# Patient Record
Sex: Female | Born: 1955 | Race: White | Hispanic: No | Marital: Married | Smoking: Never smoker
Health system: Southern US, Community
[De-identification: ages and names within clinical notes are randomized; demographics above are authoritative.]

## PROBLEM LIST (undated history)

## (undated) DIAGNOSIS — K8020 Calculus of gallbladder without cholecystitis without obstruction: Secondary | ICD-10-CM

## (undated) DIAGNOSIS — J45909 Unspecified asthma, uncomplicated: Secondary | ICD-10-CM

## (undated) DIAGNOSIS — F32A Depression, unspecified: Secondary | ICD-10-CM

## (undated) DIAGNOSIS — F419 Anxiety disorder, unspecified: Secondary | ICD-10-CM

## (undated) DIAGNOSIS — Z87440 Personal history of urinary (tract) infections: Secondary | ICD-10-CM

## (undated) DIAGNOSIS — E785 Hyperlipidemia, unspecified: Secondary | ICD-10-CM

## (undated) DIAGNOSIS — E559 Vitamin D deficiency, unspecified: Secondary | ICD-10-CM

## (undated) DIAGNOSIS — I499 Cardiac arrhythmia, unspecified: Secondary | ICD-10-CM

## (undated) DIAGNOSIS — K5900 Constipation, unspecified: Secondary | ICD-10-CM

## (undated) DIAGNOSIS — D649 Anemia, unspecified: Secondary | ICD-10-CM

## (undated) DIAGNOSIS — R87629 Unspecified abnormal cytological findings in specimens from vagina: Secondary | ICD-10-CM

## (undated) DIAGNOSIS — K219 Gastro-esophageal reflux disease without esophagitis: Secondary | ICD-10-CM

## (undated) DIAGNOSIS — G4700 Insomnia, unspecified: Secondary | ICD-10-CM

## (undated) DIAGNOSIS — T7840XA Allergy, unspecified, initial encounter: Secondary | ICD-10-CM

## (undated) DIAGNOSIS — E079 Disorder of thyroid, unspecified: Secondary | ICD-10-CM

## (undated) DIAGNOSIS — M62838 Other muscle spasm: Secondary | ICD-10-CM

## (undated) DIAGNOSIS — M1990 Unspecified osteoarthritis, unspecified site: Secondary | ICD-10-CM

## (undated) DIAGNOSIS — I493 Ventricular premature depolarization: Secondary | ICD-10-CM

## (undated) DIAGNOSIS — M810 Age-related osteoporosis without current pathological fracture: Secondary | ICD-10-CM

## (undated) DIAGNOSIS — Z780 Asymptomatic menopausal state: Secondary | ICD-10-CM

## (undated) DIAGNOSIS — R001 Bradycardia, unspecified: Secondary | ICD-10-CM

## (undated) DIAGNOSIS — J309 Allergic rhinitis, unspecified: Secondary | ICD-10-CM

## (undated) DIAGNOSIS — H9319 Tinnitus, unspecified ear: Secondary | ICD-10-CM

## (undated) DIAGNOSIS — F329 Major depressive disorder, single episode, unspecified: Secondary | ICD-10-CM

## (undated) HISTORY — DX: Depression, unspecified: F32.A

## (undated) HISTORY — DX: Unspecified abnormal cytological findings in specimens from vagina: R87.629

## (undated) HISTORY — PX: COSMETIC SURGERY: SHX468

## (undated) HISTORY — DX: Personal history of urinary (tract) infections: Z87.440

## (undated) HISTORY — DX: Tinnitus, unspecified ear: H93.19

## (undated) HISTORY — DX: Allergic rhinitis, unspecified: J30.9

## (undated) HISTORY — DX: Major depressive disorder, single episode, unspecified: F32.9

## (undated) HISTORY — DX: Disorder of thyroid, unspecified: E07.9

## (undated) HISTORY — DX: Hyperlipidemia, unspecified: E78.5

## (undated) HISTORY — DX: Anxiety disorder, unspecified: F41.9

## (undated) HISTORY — DX: Age-related osteoporosis without current pathological fracture: M81.0

## (undated) HISTORY — DX: Other muscle spasm: M62.838

## (undated) HISTORY — DX: Unspecified osteoarthritis, unspecified site: M19.90

## (undated) HISTORY — DX: Asymptomatic menopausal state: Z78.0

## (undated) HISTORY — DX: Ventricular premature depolarization: I49.3

## (undated) HISTORY — DX: Bradycardia, unspecified: R00.1

## (undated) HISTORY — DX: Vitamin D deficiency, unspecified: E55.9

## (undated) HISTORY — DX: Allergy, unspecified, initial encounter: T78.40XA

## (undated) HISTORY — DX: Calculus of gallbladder without cholecystitis without obstruction: K80.20

## (undated) HISTORY — DX: Insomnia, unspecified: G47.00

## (undated) HISTORY — PX: BREAST CYST ASPIRATION: SHX578

---

## 2003-07-15 HISTORY — PX: GANGLION CYST EXCISION: SHX1691

## 2004-02-07 ENCOUNTER — Other Ambulatory Visit: Admission: RE | Admit: 2004-02-07 | Discharge: 2004-02-07 | Payer: Self-pay | Admitting: Obstetrics and Gynecology

## 2004-04-02 ENCOUNTER — Other Ambulatory Visit: Admission: RE | Admit: 2004-04-02 | Discharge: 2004-04-02 | Payer: Self-pay | Admitting: Obstetrics and Gynecology

## 2004-07-11 ENCOUNTER — Ambulatory Visit (HOSPITAL_BASED_OUTPATIENT_CLINIC_OR_DEPARTMENT_OTHER): Admission: RE | Admit: 2004-07-11 | Discharge: 2004-07-11 | Payer: Self-pay | Admitting: Orthopedic Surgery

## 2005-03-19 ENCOUNTER — Other Ambulatory Visit: Admission: RE | Admit: 2005-03-19 | Discharge: 2005-03-19 | Payer: Self-pay | Admitting: Obstetrics and Gynecology

## 2006-08-11 ENCOUNTER — Ambulatory Visit: Payer: Self-pay | Admitting: Cardiology

## 2006-08-11 LAB — CONVERTED CEMR LAB
Basophils Absolute: 0 10*3/uL (ref 0.0–0.1)
Calcium: 9.4 mg/dL (ref 8.4–10.5)
Chloride: 106 meq/L (ref 96–112)
Eosinophils Absolute: 0.1 10*3/uL (ref 0.0–0.6)
Eosinophils Relative: 1.6 % (ref 0.0–5.0)
GFR calc non Af Amer: 71 mL/min
MCHC: 34.5 g/dL (ref 30.0–36.0)
MCV: 95.4 fL (ref 78.0–100.0)
Platelets: 241 10*3/uL (ref 150–400)
RBC: 4.22 M/uL (ref 3.87–5.11)
TSH: 1.68 microintl units/mL (ref 0.35–5.50)
WBC: 5.6 10*3/uL (ref 4.5–10.5)

## 2006-09-04 ENCOUNTER — Ambulatory Visit: Payer: Self-pay | Admitting: Internal Medicine

## 2006-09-04 ENCOUNTER — Ambulatory Visit (HOSPITAL_COMMUNITY): Admission: RE | Admit: 2006-09-04 | Discharge: 2006-09-04 | Payer: Self-pay | Admitting: Cardiology

## 2007-07-15 HISTORY — PX: SEPTOPLASTY: SUR1290

## 2008-08-02 ENCOUNTER — Inpatient Hospital Stay (HOSPITAL_COMMUNITY): Admission: AD | Admit: 2008-08-02 | Discharge: 2008-08-02 | Payer: Self-pay | Admitting: Obstetrics and Gynecology

## 2008-10-12 DIAGNOSIS — M810 Age-related osteoporosis without current pathological fracture: Secondary | ICD-10-CM

## 2008-10-12 HISTORY — DX: Age-related osteoporosis without current pathological fracture: M81.0

## 2009-05-07 ENCOUNTER — Encounter: Admission: RE | Admit: 2009-05-07 | Discharge: 2009-05-07 | Payer: Self-pay | Admitting: Gastroenterology

## 2010-06-24 ENCOUNTER — Ambulatory Visit (HOSPITAL_BASED_OUTPATIENT_CLINIC_OR_DEPARTMENT_OTHER)
Admission: RE | Admit: 2010-06-24 | Discharge: 2010-06-24 | Payer: Self-pay | Source: Home / Self Care | Attending: Family Medicine | Admitting: Family Medicine

## 2010-07-14 HISTORY — PX: DIAGNOSTIC LAPAROSCOPY: SUR761

## 2010-08-19 ENCOUNTER — Encounter (HOSPITAL_COMMUNITY)
Admission: RE | Admit: 2010-08-19 | Discharge: 2010-08-19 | Disposition: A | Discharge status: Home / Self Care | Payer: 59 | Source: Ambulatory Visit | Attending: Obstetrics and Gynecology | Admitting: Obstetrics and Gynecology

## 2010-08-19 DIAGNOSIS — Z01812 Encounter for preprocedural laboratory examination: Secondary | ICD-10-CM | POA: Insufficient documentation

## 2010-08-19 LAB — BASIC METABOLIC PANEL
CO2: 25 mEq/L (ref 19–32)
Chloride: 103 mEq/L (ref 96–112)
GFR calc Af Amer: >60 mL/min (ref >60)
Potassium: 4.2 mEq/L (ref 3.5–5.1)
Sodium: 138 mEq/L (ref 135–145)

## 2010-08-19 LAB — SURGICAL PCR SCREEN: MRSA, PCR: NEGATIVE

## 2010-08-19 LAB — CBC
Hemoglobin: 13.6 g/dL (ref 12.0–15.0)
RBC: 4.48 MIL/uL (ref 3.87–5.11)

## 2010-08-23 ENCOUNTER — Other Ambulatory Visit: Payer: Self-pay | Admitting: Obstetrics and Gynecology

## 2010-08-23 ENCOUNTER — Ambulatory Visit (HOSPITAL_COMMUNITY)
Admission: RE | Admit: 2010-08-23 | Discharge: 2010-08-23 | Disposition: A | Discharge status: Home / Self Care | Payer: 59 | Source: Ambulatory Visit | Attending: Obstetrics and Gynecology | Admitting: Obstetrics and Gynecology

## 2010-08-23 DIAGNOSIS — N83209 Unspecified ovarian cyst, unspecified side: Secondary | ICD-10-CM | POA: Insufficient documentation

## 2010-08-23 DIAGNOSIS — N838 Other noninflammatory disorders of ovary, fallopian tube and broad ligament: Secondary | ICD-10-CM | POA: Insufficient documentation

## 2010-08-23 DIAGNOSIS — Z01812 Encounter for preprocedural laboratory examination: Secondary | ICD-10-CM | POA: Insufficient documentation

## 2010-08-23 DIAGNOSIS — R1032 Left lower quadrant pain: Secondary | ICD-10-CM | POA: Insufficient documentation

## 2010-09-04 NOTE — Op Note (Addendum)
  NAME:  Chelsea Vega, Chelsea Vega                  ACCOUNT NO.:  [id number]  MEDICAL RECORD NO.:  [id number]           PATIENT TYPE:  O  LOCATION:  SDC                           FACILITY:  [facility]  PHYSICIAN:  Analese Sovine L. Stuart Mirabile, M.D.DATE OF BIRTH:  13-May-1956  DATE OF PROCEDURE:  08/23/2010 DATE OF DISCHARGE:  08/19/2010                              OPERATIVE REPORT   PREOPERATIVE DIAGNOSES:  Left lower quadrant pain and left ovarian cyst.  POSTOPERATIVE DIAGNOSES:  Left lower quadrant pain and left ovarian cyst.  PROCEDURE:  Laparoscopic BSO.  SURGEON:  Alyiah Ulloa L. Vincente Poli, MD.  ANESTHESIA:  General.  FINDINGS:  Left paratubal cyst.  SPECIMENS:  Left tube and ovary, right tube and ovary sent to pathology.  ESTIMATED BLOOD LOSS:  None.  DRAINS:  None.  PROCEDURE:  The patient was taken to the operating room.  She was prepped and draped in the usual sterile fashion and in-and-out catheter was used to empty the bladder.  A uterine manipulator was inserted. Attention was turned to the abdomen.  A small infraumbilical incision was made.  A Veress needle was inserted and pneumoperitoneum was performed.  The 11-mm trocar was inserted.  The patient was placed in Trendelenburg position.  Laparoscope was introduced through the trocar sheath.  A 5-mm trocar was inserted suprapubically under direct visualization.  Exam of the pelvis revealed the uterus was very small. There was no endometriosis.  No adhesions noted.  Right tube and ovary appeared normal.  Left ovary appeared normal and there was a 2-1/2 cm peritubal cyst, which I think was the what we saw on ultrasound.  This may be the cause of the patient's pain because it may be intermittently becoming twisted.  Adnexa, otherwise unremarkable.  We used the EnSeal instrument, identified the ureter which was well below where we were placed just beneath the ovary on the right side and cauterized the infundibular pelvic ligament and went along the  mesosalpinx staying against the tube and across the triple pedicle.  This was done on the right side and the left side.  There was no bleeding whatsoever.  We then converted the 5 to the 11-mm trocar.  Inserted  the endobag skin, both into one bag and removed the specimen without any difficulty.  I then inspected that suprapubic site under low tension and there was no bleeding noted from that site.  There was no bleeding noted from her pedicles either.  Under low tension, the pneumoperitoneum was released.  The trocar was removed.  The O Vicryl was used to close the suprapubic site and then we used skin adhesive to seal the skin.  All sponge, lap and instrument counts were correct x2.  The patient went to recovery room in stable condition.     Sheilah Rayos L. Vincente Poli, M.D.     Florestine Avers  D:  08/23/2010  T:  08/23/2010  Job:  [id number]  Electronically Signed by Marcelle Overlie M.D. on 09/04/2010 07:55:31 AM

## 2010-11-29 NOTE — Op Note (Signed)
NAME:  Chelsea Vega, STRECK                  ACCOUNT NO.:  [id number]   MEDICAL RECORD NO.:  [id number]          PATIENT TYPE:  AMB   LOCATION:  DSC                          FACILITY:  [facility]   PHYSICIAN:  Katy Fitch. Sypher Montez Hageman., M.D.DATE OF BIRTH:  Jul 04, 1956   DATE OF PROCEDURE:  07/11/2004  DATE OF DISCHARGE:                                 OPERATIVE REPORT   PREOPERATIVE DIAGNOSIS:  Enlarging mass, dorsal aspect of the right wrist,  consistent with a dorsal wrist capsule ganglion, adjacent to the radial  wrist extensors, and deep to the extensor pollicis longus.   POSTOPERATIVE DIAGNOSIS:  Enlarging mass, dorsal aspect of the right wrist,  consistent with a dorsal wrist capsule ganglion, adjacent to the radial  wrist extensors, and deep to the extensor pollicis longus.   OPERATION:  Resection of an atypical dorsal ganglion, extending deep to the  radial wrist extensors and extensor pollicis longus, but probably  originating on the dorsal aspect of the scapholunate interosseous ligament,  with a partial capsulectomy, followed by a capsule repair.   SURGEON:  Katy Fitch. Sypher, M.D.   ASSISTANT:  Jonni Sanger, P.A.-C.   ANESTHESIA:  General, sedation, forearm local IV regional.   ANESTHESIOLOGIST:  Zenon Mayo, M.D.   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  She had pain with dorsiflexion of the wrist, and  on palmar flexion had a palpable mass 1 cm in diameter, deep to the extensor  pollicis longus, adjacent to the scaphoid.  This had all the features of a  ganglion.  Plain films of her wrist were normal.   INFORMED CONSENT:  After an informed consent, in which she was clearly  advised that we do not understand the biology of ganglions completely, and  that the chance of recurrence is significant, she proceeds at this time for  a relief  of her mechanical symptoms.   DESCRIPTION OF PROCEDURE:  The patient is brought to the operating room and  placed in the supine position upon the operating room table.  Following the  placement of a forearm-level IV regional block, the right arm was prepped  with Betadine soap and solution and sterilely draped.  When anesthesia was  satisfactory, the procedure commenced with a short transverse incision  directly over the palpably enlarged ganglion.  The subcutaneous tissues were  carefully divided, taking care to identify the extensor pollicis longus and  radial wrist extensors in the third and second dorsal compartments  respectively.  The mass was circumferentially dissected and appeared to have a small sinus  track extending directly to the dorsal aspect of the scapholunate  interosseous ligament.  The mass and its neck were excised down to the level  of the ligament.  A Freer elevator was then used to carefully palpate and  compress the dorsal capsule, looking for other areas of mucin production.  On a meticulous examination of the dorsal capsule, I could not identify any  other origins of this predicament.  The capsulectomy site was then closed with figure-of-eight sutures of #4-0  Vicryl x2.  A water-tight seal of the wrist joint was achieved.  The wound  was then repaired with intra-dermal #3-0 Prolene suture.  A compressive  dressing was applied with a volar plaster splint, maintaining the wrist in  five degrees of dorsiflexion.   AFTER-CARE:  The patient is given a prescription for Darvocet-N 100, one  p.o. q.4-6h. p.r.n. pain, #20 tab, without refill.  She will return to our office for followup in one week for a dressing change  and a wound examination, with the initiation of range of motion exercises.      Robe   RVS/MEDQ  D:  07/11/2004  T:  07/11/2004  Job:  [id number]

## 2010-11-29 NOTE — Assessment & Plan Note (Signed)
[facility]                            CARDIOLOGY OFFICE NOTE   [name], [name]                         MRN:          [id number]  DATE:08/11/2006                            DOB:          Mar 06, 1956    PRIMARY CARE PHYSICIAN:  Joycelyn Rua, M.D.   REASON FOR PRESENTATION:  Patient with dyspnea and tachycardia.   HISTORY OF PRESENT ILLNESS:  The patient is a pleasant 55 year old white  female.  She is a runner, running 3-4 times per week for years.  She has  noticed, however, recently when she has taken her heart rate with a  monitor which she has been using, or on a treadmill, that when she gets  to a moderate level of activity with perhaps a 10 minute mile, where she  is quite comfortable, her heart rate may be as high as 200 beats per  minute.  She thinks that she is somewhat limited at this level by more  dyspnea than she should have.  She thinks that this has been a slowly  progressive problem and she has been unable to increase her training  because of this.  She does not have any resting tachycardia or  palpitations.  She has not had any presyncope or syncope.  She does not  have any chest discomfort, neck discomfort, arm discomfort, activity-  induced nausea, vomiting, excessive diaphoresis.  She does not have any  resting shortness of breath and denies any PND or orthopnea.   PAST MEDICAL HISTORY:  Depression.   PAST SURGICAL HISTORY:  Ganglion cyst resected.   ALLERGIES:  None.   MEDICATIONS:  Flonase, Claritin.   SOCIAL HISTORY:  The patient is married.  She has three adult children.  She has never really smoked cigarettes.  She will occasionally drink  alcohol.   FAMILY HISTORY:  Noncontributory for early coronary artery disease.  She  does have a sister with Graves' disease.   REVIEW OF SYSTEMS:  As stated in the HPI and positive for questionable  mild asthma, occasional constipation.  Negative for all other systems.   PHYSICAL EXAMINATION:  GENERAL:  The patient is in no distress.  VITAL SIGNS:  Blood pressure 113/77, heart rate 53 and regular, weight  117 pounds, body mass index 19.  HEENT:  Eyelids unremarkable.  Pupils equal, round, and reactive to  light.  Fundi within normal limits.  Oral mucosa normal.  NECK:  No jugular venous distention to 45 degrees.  Carotid upstroke  brisk and symmetric.  No bruits, no thyromegaly.  LYMPHATICS:  No cervical, axillary, or inguinal adenopathy.  LUNGS:  Clear to auscultation bilaterally.  BACK:  No costovertebral angle tenderness.  CHEST:  Unremarkable.  HEART:  PMI not displaced or sustained.  S1 and S2 within normal limits.  No S3, no S4, no clicks, no rubs, no murmurs.  ABDOMEN:  Flat.  Positive bowel sounds.  Normal in frequency, pitch.  No  bruits, rebound, guarding, no midline pulsatile mass, hepatomegaly,  splenomegaly.  SKIN:  No rashes, no nodules  EXTREMITIES:  2+ pulses throughout, no cyanosis,  no clubbing, no edema.  NEUROLOGIC:  Oriented to person, place, and time.  Cranial nerves II-XII  grossly intact.  Motor grossly intact throughout.   LABORATORY DATA:  EKG:  Sinus bradycardia, rate 53, axis within normal  limits.  Intervals  within normal limits.  No acute ST/T-wave changes.   ASSESSMENT/PLAN:  1. Dyspnea.  The patient is limited by dyspnea with exertion at a      level that she thinks should otherwise be comfortable.  She is also      having a tachycardia.  At this point I am going to check a TSH,      CBC, and CMET.  I also think the next step that would be very      helpful for this runner with unexplained dyspnea and questionable      asthma (questionable exercise-induced asthma) would be a      cardiopulmonary stress test.  We discussed this and the patient      agrees to proceed.  2. Follow-up will be based on the results of the above.     Rollene Rotunda, MD, [facility]  Electronically Signed    JH/MedQ  DD: 08/11/2006  DT:  08/11/2006  Job #: [id number]   cc:   Joycelyn Rua, M.D.

## 2011-03-05 ENCOUNTER — Ambulatory Visit (INDEPENDENT_AMBULATORY_CARE_PROVIDER_SITE_OTHER): Payer: 59 | Admitting: Family Medicine

## 2011-03-05 ENCOUNTER — Encounter: Payer: Self-pay | Admitting: Family Medicine

## 2011-03-05 VITALS — BP 102/67 | HR 54 | Ht 65.0 in | Wt 120.0 lb

## 2011-03-05 DIAGNOSIS — M25579 Pain in unspecified ankle and joints of unspecified foot: Secondary | ICD-10-CM

## 2011-03-05 DIAGNOSIS — S93409A Sprain of unspecified ligament of unspecified ankle, initial encounter: Secondary | ICD-10-CM

## 2011-03-05 MED ORDER — MELOXICAM 7.5 MG PO TABS: 7.5000 mg | ORAL_TABLET | Freq: Every day | ORAL | Status: AC

## 2011-03-05 NOTE — Patient Instructions (Signed)
1. Follow up in 3-4 weeks.  2. Use meloxicam once daily for pain and inflammation.  3. Wear support with daily activities.

## 2011-03-06 DIAGNOSIS — S93409A Sprain of unspecified ligament of unspecified ankle, initial encounter: Secondary | ICD-10-CM | POA: Insufficient documentation

## 2011-03-06 NOTE — Assessment & Plan Note (Signed)
Adding ASO for daily activity and starting ankle sprain rehab at physical therapy.  If she doesn't have improvement over the next 3-4 weeks we will need to rule out a tear of the peroneal tendons with either and ankle ultrasound or MRI.  Mobic rx given today as well.

## 2011-03-06 NOTE — Progress Notes (Signed)
  Subjective:    Patient ID: Chelsea Vega, female    DOB: 04-22-1956, 55 y.o.   MRN: [id number]  HPI 55 y/o female had an inversion ankle sprain around the end of July.  She is here because she still gets pain around the right lateral malleolus with running, which resolves with rest.  She took 3 weeks off immediately after the sprain.  When she returned to running she was able to run about 4 miles before the pain started.  Lately she cannot even run for one mile before the pain starts.  She can't remember if there was swelling immediately because she was drinking but does state that there was pain and swelling the next day.  She waited for the symptoms to resolve before returning to running.  She doesn't have any pain with normal daily activity.     Review of Systems     Objective:   Physical Exam  Right ankle: Trace swelling of the anterior lateral malleolus without visible erythema. Tenderness to palpation over the peroneal tendons proximal to the insertion on the 5th metatarsal Range of motion is full in all directions. Strength is 5/5 in all directions. Stable lateral and medial ligaments; squeeze test and kleiger test unremarkable; Talar dome nontender; No pain at base of 5th MT;  No tenderness on posterior aspects of the medial malleolus Able to walk 4 steps.       Assessment & Plan:

## 2011-04-02 ENCOUNTER — Ambulatory Visit (HOSPITAL_COMMUNITY)
Admission: RE | Admit: 2011-04-02 | Discharge: 2011-04-02 | Disposition: A | Discharge status: Home / Self Care | Payer: 59 | Source: Ambulatory Visit | Attending: Family Medicine | Admitting: Family Medicine

## 2011-04-02 ENCOUNTER — Ambulatory Visit (INDEPENDENT_AMBULATORY_CARE_PROVIDER_SITE_OTHER): Payer: 59 | Admitting: Family Medicine

## 2011-04-02 VITALS — BP 115/73

## 2011-04-02 DIAGNOSIS — M25579 Pain in unspecified ankle and joints of unspecified foot: Secondary | ICD-10-CM | POA: Insufficient documentation

## 2011-04-02 DIAGNOSIS — S93409A Sprain of unspecified ligament of unspecified ankle, initial encounter: Secondary | ICD-10-CM

## 2011-04-04 NOTE — Progress Notes (Signed)
  Subjective:    Patient ID: Chelsea Vega, female    DOB: 30-Aug-1955, 55 y.o.   MRN: [id number]  HPI 55 y/o female is here for follow up for ongoing right ankle pain following and inversion injury in July.  She has been to physical therapy and reports that the pain is worse with therapy.  She still gets intermittent swelling.  She has not returned to running.  She has mild pain with daily activities but by the end of the day she has significant swelling about the lateral ankle.   Review of Systems     Objective:   Physical Exam  Ankle: Mild swelling of the lateral malleolus Range of motion is full in all directions. Strength is 5/5 in all directions. Mildly positive drawer Tenderness to palpation over the anterior aspect of the lateral malleolus No tenderness of the peroneal tendons, No pain at base of 5th MT No tenderness over cuboid; No tenderness over the navicular prominence No tenderness on posterior aspects of lateral and medial malleolus Able to walk 4 steps.      Assessment & Plan:

## 2011-04-04 NOTE — Progress Notes (Signed)
MRI authorization # is [id number].  Pt notified that appt is 04/07/11 @ 6:45pm, arrive at 6:15pm @ [address] [place] [facility].

## 2011-04-07 ENCOUNTER — Ambulatory Visit
Admission: RE | Admit: 2011-04-07 | Discharge: 2011-04-07 | Disposition: A | Discharge status: Home / Self Care | Payer: 59 | Source: Ambulatory Visit | Attending: Sports Medicine | Admitting: Sports Medicine

## 2011-04-07 DIAGNOSIS — M25579 Pain in unspecified ankle and joints of unspecified foot: Secondary | ICD-10-CM

## 2011-04-07 NOTE — Assessment & Plan Note (Signed)
Plain films ordered on the day of the visit are normal.  Plan on MRI to evaluated for ligament disruption, tendon rupture, and OCD.  Will treat according to results.

## 2011-04-14 ENCOUNTER — Telehealth: Payer: Self-pay | Admitting: *Deleted

## 2011-04-14 NOTE — Telephone Encounter (Signed)
Per Dr. Henriette Combs order, pt scheduled for appt with Dr. Thurston Hole 04/17/11 at 330.  Pt notified of appt info, referral info faxed to Dr. Sherene Sires office.

## 2011-05-15 HISTORY — PX: ANKLE SURGERY: SHX546

## 2011-12-31 ENCOUNTER — Other Ambulatory Visit: Payer: Self-pay | Admitting: Obstetrics and Gynecology

## 2013-01-06 ENCOUNTER — Other Ambulatory Visit: Payer: Self-pay | Admitting: Obstetrics and Gynecology

## 2013-01-25 ENCOUNTER — Other Ambulatory Visit: Payer: Self-pay | Admitting: Gastroenterology

## 2013-10-28 ENCOUNTER — Other Ambulatory Visit: Payer: Self-pay | Admitting: Dermatology

## 2014-01-23 ENCOUNTER — Other Ambulatory Visit: Payer: Self-pay | Admitting: Obstetrics and Gynecology

## 2014-01-24 LAB — CYTOLOGY - PAP

## 2015-02-01 ENCOUNTER — Other Ambulatory Visit: Payer: Self-pay | Admitting: Obstetrics and Gynecology

## 2015-02-02 LAB — CYTOLOGY - PAP

## 2015-02-15 ENCOUNTER — Ambulatory Visit: Admit: 2015-02-15 | Payer: Self-pay | Admitting: Obstetrics and Gynecology

## 2015-02-15 SURGERY — ANTERIOR (CYSTOCELE) AND POSTERIOR REPAIR (RECTOCELE)
Anesthesia: Choice

## 2015-05-16 NOTE — H&P (Signed)
59 year old G 3 P 3 PMP with symptomatic prolapse. She is on elestrin and Prometrium.  Past Medical History: UTI  Past Surgical history: None  Prior to Admission medications   Medication Sig Start Date End Date Taking? Authorizing Provider  B Complex Vitamins (B COMPLEX PO) Take 1 tablet by mouth daily.      Historical Provider, MD  Calcium Carbonate (CALTRATE 600 PO) Take 1 tablet by mouth daily.      Historical Provider, MD  cholecalciferol (VITAMIN D) 1000 UNITS tablet Take 4,000 Units by mouth daily.      Historical Provider, MD  Estradiol (ELESTRIN) 0.52 MG/0.87 GM (0.06%) GEL Place onto the skin daily.      Historical Provider, MD  fluticasone (FLONASE) 50 MCG/ACT nasal spray Place 2 puffs into the nose as needed. 01/06/11   Historical Provider, MD  lubiprostone (AMITIZA) 8 MCG capsule Take 8 mcg by mouth daily.      Historical Provider, MD  progesterone (PROMETRIUM) 100 MG capsule Take 100 mg by mouth daily. 01/27/11   Historical Provider, MD  Zoledronic Acid (RECLAST IV) IV once per year.     Historical Provider, MD   Allergies:  None  Family history positive for Heart disease, Asthma, IBS, Ovarian cancer (mother)  Vital signs stable  General alert and oriented Lung CTAB Car RRR Abdomen is soft and non tender Pelvic Grade 2 cystocele Grade 1 to 2 rectocele Grade 1 to 2 uterine prolapse  IMPRESSION: Symptomatic Prolapse  PLAN: Anterior repair Posterior Repair Possible Total Vaginal Hysterectomy  Risks reviewed Consent signed

## 2015-05-18 ENCOUNTER — Encounter (HOSPITAL_COMMUNITY)
Admission: RE | Admit: 2015-05-18 | Discharge: 2015-05-18 | Disposition: A | Discharge status: Home / Self Care | Payer: 59 | Source: Ambulatory Visit | Attending: Obstetrics and Gynecology | Admitting: Obstetrics and Gynecology

## 2015-05-18 ENCOUNTER — Encounter (HOSPITAL_COMMUNITY): Payer: Self-pay

## 2015-05-18 DIAGNOSIS — Z01818 Encounter for other preprocedural examination: Secondary | ICD-10-CM | POA: Diagnosis not present

## 2015-05-18 DIAGNOSIS — N816 Rectocele: Secondary | ICD-10-CM | POA: Diagnosis not present

## 2015-05-18 DIAGNOSIS — N8110 Cystocele, unspecified: Secondary | ICD-10-CM | POA: Insufficient documentation

## 2015-05-18 HISTORY — DX: Gastro-esophageal reflux disease without esophagitis: K21.9

## 2015-05-18 HISTORY — DX: Anemia, unspecified: D64.9

## 2015-05-18 HISTORY — DX: Cardiac arrhythmia, unspecified: I49.9

## 2015-05-18 HISTORY — DX: Constipation, unspecified: K59.00

## 2015-05-18 HISTORY — DX: Unspecified asthma, uncomplicated: J45.909

## 2015-05-18 LAB — CBC
HCT: 41.7 % (ref 36.0–46.0)
HEMOGLOBIN: 13.9 g/dL (ref 12.0–15.0)
MCH: 31.4 pg (ref 26.0–34.0)
MCHC: 33.3 g/dL (ref 30.0–36.0)
MCV: 94.3 fL (ref 78.0–100.0)
PLATELETS: 209 10*3/uL (ref 150–400)
RBC: 4.42 MIL/uL (ref 3.87–5.11)
RDW: 12.1 % (ref 11.5–15.5)
WBC: 7.5 10*3/uL (ref 4.0–10.5)

## 2015-05-18 NOTE — Patient Instructions (Signed)
Your procedure is scheduled on:  Thursday, NOV. 10, 2016  Enter through the Main Entrance of [facility], [place] at:  6:00 AM  Pick up the phone at the desk and dial [phone].  Call this number if you have problems the morning of surgery: [phone].  Remember: Do NOT eat food or drink after:  Midnight Wednesday Take these medicines the morning of surgery with a SIP OF WATER: None  Do NOT wear jewelry (body piercing), metal hair clips/bobby pins, make-up, or nail polish. Do NOT wear lotions, powders, or perfumes.  You may wear deoderant. Do NOT shave for 48 hours prior to surgery. Do NOT bring valuables to the hospital. Contacts, dentures, or bridgework may not be worn into surgery. Leave suitcase in car.  After surgery it may be brought to your room.  For patients admitted to the hospital, checkout time is 11:00 AM the day of discharge. Have a responsible adult drive you home and stay with you for 24 hours after your procedure

## 2015-05-23 ENCOUNTER — Encounter (HOSPITAL_COMMUNITY): Payer: Self-pay | Admitting: Anesthesiology

## 2015-05-23 NOTE — Anesthesia Preprocedure Evaluation (Addendum)
Anesthesia Evaluation  Patient identified by MRN, date of birth, ID band Patient awake    Reviewed: Allergy & Precautions, NPO status , Patient's Chart, lab work & pertinent test results  Airway Mallampati: I  TM Distance: >3 FB Neck ROM: Full    Dental no notable dental hx. (+) Teeth Intact   Pulmonary asthma ,    Pulmonary exam normal breath sounds clear to auscultation       Cardiovascular Normal cardiovascular exam+ dysrhythmias  Rhythm:Regular Rate:Normal     Neuro/Psych negative neurological ROS  negative psych ROS   GI/Hepatic Neg liver ROS, GERD  Medicated and Controlled,  Endo/Other    Renal/GU negative Renal ROS   Cystocele negative genitourinary   Musculoskeletal negative musculoskeletal ROS (+)   Abdominal (+) - obese,   Peds  Hematology  (+) anemia ,   Anesthesia Other Findings   Reproductive/Obstetrics                            Anesthesia Physical Anesthesia Plan  ASA: II  Anesthesia Plan: Spinal   Post-op Pain Management:    Induction:   Airway Management Planned: Natural Airway and Nasal Cannula  Additional Equipment:   Intra-op Plan:   Post-operative Plan:   Informed Consent: I have reviewed the patients History and Physical, chart, labs and discussed the procedure including the risks, benefits and alternatives for the proposed anesthesia with the patient or authorized representative who has indicated his/her understanding and acceptance.   Dental advisory given  Plan Discussed with: Anesthesiologist, CRNA and Surgeon  Anesthesia Plan Comments:        Anesthesia Quick Evaluation

## 2015-05-24 ENCOUNTER — Ambulatory Visit (HOSPITAL_COMMUNITY): Payer: 59 | Admitting: Anesthesiology

## 2015-05-24 ENCOUNTER — Observation Stay (HOSPITAL_COMMUNITY)
Admission: RE | Admit: 2015-05-24 | Discharge: 2015-05-25 | Disposition: A | Discharge status: Home / Self Care | Payer: 59 | Source: Ambulatory Visit | Attending: Obstetrics and Gynecology | Admitting: Obstetrics and Gynecology

## 2015-05-24 ENCOUNTER — Encounter (HOSPITAL_COMMUNITY): Payer: Self-pay

## 2015-05-24 ENCOUNTER — Encounter (HOSPITAL_COMMUNITY)
Admission: RE | Disposition: A | Discharge status: Home / Self Care | Payer: Self-pay | Source: Ambulatory Visit | Attending: Obstetrics and Gynecology

## 2015-05-24 DIAGNOSIS — J45909 Unspecified asthma, uncomplicated: Secondary | ICD-10-CM | POA: Insufficient documentation

## 2015-05-24 DIAGNOSIS — N812 Incomplete uterovaginal prolapse: Principal | ICD-10-CM | POA: Insufficient documentation

## 2015-05-24 DIAGNOSIS — K219 Gastro-esophageal reflux disease without esophagitis: Secondary | ICD-10-CM | POA: Insufficient documentation

## 2015-05-24 DIAGNOSIS — I499 Cardiac arrhythmia, unspecified: Secondary | ICD-10-CM | POA: Diagnosis not present

## 2015-05-24 DIAGNOSIS — N888 Other specified noninflammatory disorders of cervix uteri: Secondary | ICD-10-CM | POA: Diagnosis not present

## 2015-05-24 DIAGNOSIS — Z79899 Other long term (current) drug therapy: Secondary | ICD-10-CM | POA: Diagnosis not present

## 2015-05-24 DIAGNOSIS — N819 Female genital prolapse, unspecified: Secondary | ICD-10-CM | POA: Diagnosis present

## 2015-05-24 DIAGNOSIS — N879 Dysplasia of cervix uteri, unspecified: Secondary | ICD-10-CM | POA: Insufficient documentation

## 2015-05-24 HISTORY — PX: ANTERIOR AND POSTERIOR REPAIR: SHX5121

## 2015-05-24 HISTORY — PX: VAGINAL HYSTERECTOMY: SHX2639

## 2015-05-24 SURGERY — ANTERIOR (CYSTOCELE) AND POSTERIOR REPAIR (RECTOCELE)
Anesthesia: Spinal

## 2015-05-24 MED ORDER — BUPIVACAINE IN DEXTROSE 0.75-8.25 % IT SOLN
INTRATHECAL | Status: DC | PRN
  Administered 2015-05-24: 11 mg via INTRATHECAL

## 2015-05-24 MED ORDER — CEFAZOLIN SODIUM-DEXTROSE 2-3 GM-% IV SOLR
INTRAVENOUS | Status: AC
  Filled 2015-05-24: qty 50

## 2015-05-24 MED ORDER — BUPIVACAINE-EPINEPHRINE (PF) 0.5% -1:200000 IJ SOLN
INTRAMUSCULAR | Status: AC
  Filled 2015-05-24: qty 30

## 2015-05-24 MED ORDER — FENTANYL CITRATE (PF) 250 MCG/5ML IJ SOLN
INTRAMUSCULAR | Status: AC
  Filled 2015-05-24: qty 25

## 2015-05-24 MED ORDER — DIPHENHYDRAMINE HCL 25 MG PO CAPS
25.0000 mg | ORAL_CAPSULE | ORAL | Status: DC | PRN
  Filled 2015-05-24: qty 1

## 2015-05-24 MED ORDER — LORATADINE 10 MG PO TABS
10.0000 mg | ORAL_TABLET | Freq: Every day | ORAL | Status: DC
  Administered 2015-05-24 – 2015-05-25 (×2): 10 mg via ORAL
  Filled 2015-05-24 (×3): qty 1

## 2015-05-24 MED ORDER — CEFAZOLIN SODIUM-DEXTROSE 2-3 GM-% IV SOLR
2.0000 g | INTRAVENOUS | Status: AC
  Administered 2015-05-24: 2 g via INTRAVENOUS

## 2015-05-24 MED ORDER — EPHEDRINE SULFATE 50 MG/ML IJ SOLN
INTRAMUSCULAR | Status: DC | PRN
  Administered 2015-05-24 (×2): 5 mg via INTRAVENOUS

## 2015-05-24 MED ORDER — DEXTROSE IN LACTATED RINGERS 5 % IV SOLN
INTRAVENOUS | Status: DC
  Administered 2015-05-24 – 2015-05-25 (×2): via INTRAVENOUS

## 2015-05-24 MED ORDER — NALOXONE HCL 0.4 MG/ML IJ SOLN: 0.4000 mg | INTRAMUSCULAR | Status: DC | PRN

## 2015-05-24 MED ORDER — IBUPROFEN 600 MG PO TABS
600.0000 mg | ORAL_TABLET | Freq: Four times a day (QID) | ORAL | Status: DC | PRN
  Administered 2015-05-24 – 2015-05-25 (×3): 600 mg via ORAL
  Filled 2015-05-24 (×3): qty 1

## 2015-05-24 MED ORDER — LIDOCAINE HCL (CARDIAC) 20 MG/ML IV SOLN
INTRAVENOUS | Status: DC | PRN
  Administered 2015-05-24: 80 mg via INTRAVENOUS
  Administered 2015-05-24: 20 mg via INTRAVENOUS

## 2015-05-24 MED ORDER — PROPOFOL 500 MG/50ML IV EMUL
INTRAVENOUS | Status: AC
  Filled 2015-05-24: qty 50

## 2015-05-24 MED ORDER — ESTRADIOL 0.1 MG/GM VA CREA
TOPICAL_CREAM | VAGINAL | Status: DC | PRN
  Administered 2015-05-24: 1 via VAGINAL

## 2015-05-24 MED ORDER — PROPOFOL 10 MG/ML IV BOLUS
INTRAVENOUS | Status: AC
  Filled 2015-05-24: qty 20

## 2015-05-24 MED ORDER — GLYCOPYRROLATE 0.2 MG/ML IJ SOLN
INTRAMUSCULAR | Status: AC
  Filled 2015-05-24: qty 1

## 2015-05-24 MED ORDER — MIDAZOLAM HCL 2 MG/2ML IJ SOLN
INTRAMUSCULAR | Status: AC
  Filled 2015-05-24: qty 4

## 2015-05-24 MED ORDER — NALBUPHINE HCL 10 MG/ML IJ SOLN: 5.0000 mg | Freq: Once | INTRAMUSCULAR | Status: DC | PRN

## 2015-05-24 MED ORDER — SCOPOLAMINE 1 MG/3DAYS TD PT72
MEDICATED_PATCH | TRANSDERMAL | Status: AC
  Administered 2015-05-24: 1.5 mg via TRANSDERMAL
  Filled 2015-05-24: qty 1

## 2015-05-24 MED ORDER — ROCURONIUM BROMIDE 100 MG/10ML IV SOLN
INTRAVENOUS | Status: AC
  Filled 2015-05-24: qty 1

## 2015-05-24 MED ORDER — DIPHENHYDRAMINE HCL 50 MG/ML IJ SOLN: 12.5000 mg | INTRAMUSCULAR | Status: DC | PRN

## 2015-05-24 MED ORDER — SCOPOLAMINE 1 MG/3DAYS TD PT72
1.0000 | MEDICATED_PATCH | Freq: Once | TRANSDERMAL | Status: DC
  Administered 2015-05-24: 1.5 mg via TRANSDERMAL

## 2015-05-24 MED ORDER — FENTANYL CITRATE (PF) 100 MCG/2ML IJ SOLN
INTRAMUSCULAR | Status: AC
  Filled 2015-05-24: qty 4

## 2015-05-24 MED ORDER — KETOROLAC TROMETHAMINE 30 MG/ML IJ SOLN
INTRAMUSCULAR | Status: DC | PRN
  Administered 2015-05-24: 30 mg via INTRAVENOUS

## 2015-05-24 MED ORDER — MORPHINE SULFATE (PF) 0.5 MG/ML IJ SOLN
INTRAMUSCULAR | Status: AC
  Filled 2015-05-24: qty 100

## 2015-05-24 MED ORDER — LIDOCAINE HCL (CARDIAC) 20 MG/ML IV SOLN
INTRAVENOUS | Status: AC
  Filled 2015-05-24: qty 5

## 2015-05-24 MED ORDER — MENTHOL 3 MG MT LOZG: 1.0000 | LOZENGE | OROMUCOSAL | Status: DC | PRN

## 2015-05-24 MED ORDER — DEXAMETHASONE SODIUM PHOSPHATE 10 MG/ML IJ SOLN
INTRAMUSCULAR | Status: DC | PRN
  Administered 2015-05-24: 4 mg via INTRAVENOUS

## 2015-05-24 MED ORDER — KETOROLAC TROMETHAMINE 30 MG/ML IJ SOLN
INTRAMUSCULAR | Status: AC
  Filled 2015-05-24: qty 1

## 2015-05-24 MED ORDER — EPHEDRINE 5 MG/ML INJ
INTRAVENOUS | Status: AC
  Filled 2015-05-24: qty 10

## 2015-05-24 MED ORDER — MIDAZOLAM HCL 2 MG/2ML IJ SOLN
INTRAMUSCULAR | Status: DC | PRN
  Administered 2015-05-24 (×2): 1 mg via INTRAVENOUS

## 2015-05-24 MED ORDER — ESTRADIOL 0.1 MG/GM VA CREA
TOPICAL_CREAM | VAGINAL | Status: AC
  Filled 2015-05-24: qty 42.5

## 2015-05-24 MED ORDER — PROPOFOL 500 MG/50ML IV EMUL
INTRAVENOUS | Status: DC | PRN
  Administered 2015-05-24: 50 ug/kg/min via INTRAVENOUS

## 2015-05-24 MED ORDER — ONDANSETRON HCL 4 MG/2ML IJ SOLN
INTRAMUSCULAR | Status: DC | PRN
  Administered 2015-05-24: 4 mg via INTRAVENOUS

## 2015-05-24 MED ORDER — NALBUPHINE HCL 10 MG/ML IJ SOLN: 5.0000 mg | INTRAMUSCULAR | Status: DC | PRN

## 2015-05-24 MED ORDER — MORPHINE SULFATE (PF) 0.5 MG/ML IJ SOLN
INTRAMUSCULAR | Status: DC | PRN
  Administered 2015-05-24: .15 mg via INTRATHECAL

## 2015-05-24 MED ORDER — GLYCOPYRROLATE 0.2 MG/ML IJ SOLN
INTRAMUSCULAR | Status: DC | PRN
  Administered 2015-05-24: 0.2 mg via INTRAVENOUS

## 2015-05-24 MED ORDER — NALOXONE HCL 2 MG/2ML IJ SOSY: 1.0000 ug/kg/h | PREFILLED_SYRINGE | INTRAVENOUS | Status: DC | PRN

## 2015-05-24 MED ORDER — ATROPINE SULFATE 0.1 MG/ML IJ SOLN
INTRAMUSCULAR | Status: AC
  Filled 2015-05-24: qty 10

## 2015-05-24 MED ORDER — MEPERIDINE HCL 25 MG/ML IJ SOLN: 6.2500 mg | INTRAMUSCULAR | Status: DC | PRN

## 2015-05-24 MED ORDER — ONDANSETRON HCL 4 MG/2ML IJ SOLN: 4.0000 mg | Freq: Three times a day (TID) | INTRAMUSCULAR | Status: DC | PRN

## 2015-05-24 MED ORDER — ONDANSETRON HCL 4 MG/2ML IJ SOLN
INTRAMUSCULAR | Status: AC
  Filled 2015-05-24: qty 2

## 2015-05-24 MED ORDER — PHENYLEPHRINE 40 MCG/ML (10ML) SYRINGE FOR IV PUSH (FOR BLOOD PRESSURE SUPPORT)
PREFILLED_SYRINGE | INTRAVENOUS | Status: AC
  Filled 2015-05-24: qty 10

## 2015-05-24 MED ORDER — DEXTROSE IN LACTATED RINGERS 5 % IV SOLN: INTRAVENOUS | Status: DC

## 2015-05-24 MED ORDER — SODIUM CHLORIDE 0.9 % IJ SOLN: 3.0000 mL | INTRAMUSCULAR | Status: DC | PRN

## 2015-05-24 MED ORDER — FENTANYL CITRATE (PF) 100 MCG/2ML IJ SOLN
INTRAMUSCULAR | Status: DC | PRN
  Administered 2015-05-24: 25 ug via INTRATHECAL
  Administered 2015-05-24: 25 ug via INTRAVENOUS

## 2015-05-24 MED ORDER — DEXAMETHASONE SODIUM PHOSPHATE 4 MG/ML IJ SOLN
INTRAMUSCULAR | Status: AC
  Filled 2015-05-24: qty 1

## 2015-05-24 MED ORDER — KETOROLAC TROMETHAMINE 30 MG/ML IJ SOLN: 30.0000 mg | Freq: Once | INTRAMUSCULAR | Status: DC

## 2015-05-24 MED ORDER — TRAMADOL HCL 50 MG PO TABS: 50.0000 mg | ORAL_TABLET | Freq: Four times a day (QID) | ORAL | Status: DC | PRN

## 2015-05-24 MED ORDER — LACTATED RINGERS IV SOLN
INTRAVENOUS | Status: DC
  Administered 2015-05-24 (×2): via INTRAVENOUS

## 2015-05-24 SURGICAL SUPPLY — 33 items
BLADE SURG 15 STRL LF C SS BP (BLADE) ×6 IMPLANT
BLADE SURG 15 STRL SS (BLADE) ×8
CLOTH BEACON ORANGE TIMEOUT ST (SAFETY) ×4 IMPLANT
CONT PATH 16OZ SNAP LID 3702 (MISCELLANEOUS) ×2 IMPLANT
DECANTER SPIKE VIAL GLASS SM (MISCELLANEOUS) IMPLANT
GLOVE BIO SURGEON STRL SZ 6.5 (GLOVE) ×4 IMPLANT
GLOVE BIO SURGEONS STRL SZ 6.5 (GLOVE) ×2
GLOVE BIOGEL PI IND STRL 6.5 (GLOVE) ×2 IMPLANT
GLOVE BIOGEL PI IND STRL 7.0 (GLOVE) ×2 IMPLANT
GLOVE BIOGEL PI INDICATOR 6.5 (GLOVE) ×4
GLOVE BIOGEL PI INDICATOR 7.0 (GLOVE) ×4
GOWN STRL REUS W/TWL LRG LVL3 (GOWN DISPOSABLE) ×18 IMPLANT
NDL HYPO 25X5/8 SAFETYGLIDE (NEEDLE) ×2 IMPLANT
NDL SPNL 22GX3.5 QUINCKE BK (NEEDLE) IMPLANT
NEEDLE HYPO 22GX1.5 SAFETY (NEEDLE) ×2 IMPLANT
NEEDLE HYPO 25X5/8 SAFETYGLIDE (NEEDLE) ×4 IMPLANT
NEEDLE SPNL 22GX3.5 QUINCKE BK (NEEDLE) IMPLANT
NS IRRIG 1000ML POUR BTL (IV SOLUTION) ×4 IMPLANT
PACK VAGINAL WOMENS (CUSTOM PROCEDURE TRAY) ×4 IMPLANT
SUT PROLENE 1 CT 1 30 (SUTURE) IMPLANT
SUT VIC AB 0 CT1 18XCR BRD8 (SUTURE) IMPLANT
SUT VIC AB 0 CT1 27 (SUTURE) ×16
SUT VIC AB 0 CT1 27XBRD ANBCTR (SUTURE) ×8 IMPLANT
SUT VIC AB 0 CT1 8-18 (SUTURE) ×8
SUT VIC AB 2-0 SH 27 (SUTURE) ×4
SUT VIC AB 2-0 SH 27XBRD (SUTURE) IMPLANT
SUT VIC AB 2-0 UR5 27 (SUTURE) ×12 IMPLANT
SUT VIC AB 3-0 SH 27 (SUTURE) ×4
SUT VIC AB 3-0 SH 27X BRD (SUTURE) IMPLANT
SUT VICRYL 0 TIES 12 18 (SUTURE) ×2 IMPLANT
TOWEL OR 17X24 6PK STRL BLUE (TOWEL DISPOSABLE) ×8 IMPLANT
TRAY FOLEY CATH SILVER 14FR (SET/KITS/TRAYS/PACK) ×4 IMPLANT
WATER STERILE IRR 1000ML POUR (IV SOLUTION) ×2 IMPLANT

## 2015-05-24 NOTE — Progress Notes (Signed)
H and P on the chart Will do under spinal Will proceed with AP Repair and possible TVH if prolapse noted Consent signed Risks reviewed

## 2015-05-24 NOTE — Anesthesia Procedure Notes (Signed)
Spinal Patient location during procedure: OR Start time: 05/24/2015 7:29 AM Staffing Anesthesiologist: Josephine Igo Performed by: anesthesiologist  Preanesthetic Checklist Completed: patient identified, site marked, surgical consent, pre-op evaluation, timeout performed, IV checked, risks and benefits discussed and monitors and equipment checked Spinal Block Patient position: sitting Prep: site prepped and draped and DuraPrep Patient monitoring: heart rate, cardiac monitor, continuous pulse ox and blood pressure Approach: midline Location: L3-4 Injection technique: single-shot Needle Needle type: Sprotte  Needle gauge: 24 G Needle length: 9 cm Needle insertion depth: 4 cm Assessment Sensory level: T6 Additional Notes Patient tolerated procedure well. Adequate sensory level.

## 2015-05-24 NOTE — Transfer of Care (Signed)
Immediate Anesthesia Transfer of Care Note  Patient: Chelsea Vega  Procedure(s) Performed: Procedure(s) with comments: ANTERIOR (CYSTOCELE) AND POSTERIOR REPAIR (RECTOCELE) (N/A) HYSTERECTOMY VAGINAL - uterus and cervix only  Patient Location: PACU  Anesthesia Type:Spinal  Level of Consciousness: awake, alert , oriented and patient cooperative  Airway & Oxygen Therapy: Patient Spontanous Breathing  Post-op Assessment: Report given to RN and Post -op Vital signs reviewed and stable  Post vital signs: Reviewed and stable  Last Vitals:  Filed Vitals:   05/24/15 0601  BP: 101/74  Pulse: 52  Temp: 36.6 C  Resp: 18    Complications: No apparent anesthesia complications

## 2015-05-24 NOTE — Addendum Note (Signed)
Addendum  created 05/24/15 2008 by Raenette Rover, CRNA   Modules edited: Notes Section   Notes Section:  File: [id number]

## 2015-05-24 NOTE — Progress Notes (Signed)
Patient doing well. Sleeping.  BP 97/54 mmHg  Pulse 55  Temp(Src) 97.6 F (36.4 C) (Oral)  Resp 15  SpO2 100%  LMP  Urine output good and normal Vaginal packing removed No active vaginal bleeding noted  Moving legs normally Will advance diet Ambulate later today Remove Foley tomorrow  Routine care

## 2015-05-24 NOTE — Anesthesia Postprocedure Evaluation (Signed)
  Anesthesia Post-op Note  Patient: Chelsea Vega  Procedure(s) Performed: Procedure(s) with comments: ANTERIOR (CYSTOCELE) AND POSTERIOR REPAIR (RECTOCELE) (N/A) HYSTERECTOMY VAGINAL - uterus and cervix only  Patient Location: [facility] Unit  Anesthesia Type:Spinal  Level of Consciousness: awake, alert , oriented and patient cooperative  Airway and Oxygen Therapy: Patient Spontanous Breathing  Post-op Pain: none  Post-op Assessment: Post-op Vital signs reviewed, Patient's Cardiovascular Status Stable, Respiratory Function Stable, Patent Airway, No signs of Nausea or vomiting, No headache, No backache and Patient able to bend at knees  Post-op Vital Signs: Reviewed and stable  Last Vitals:  Filed Vitals:   05/24/15 1721  BP: 100/47  Pulse: 58  Temp:   Resp: 16    Complications: No apparent anesthesia complications

## 2015-05-24 NOTE — Brief Op Note (Signed)
05/24/2015  8:57 AM  PATIENT:  Chelsea Vega  59 y.o. female  PRE-OPERATIVE DIAGNOSIS:   Pelvic Organ Prolapse  POST-OPERATIVE DIAGNOSIS: Same  PROCEDURE:  Procedure(s) with comments: ANTERIOR (CYSTOCELE) AND POSTERIOR REPAIR (RECTOCELE) (N/A) HYSTERECTOMY VAGINAL - uterus and cervix only  [place] Cul de sac culdoplasty  SURGEON:  Surgeon(s) and Role:    * Dian Queen, MD - Primary    * Megan Morris, DO - Assisting  PHYSICIAN ASSISTANT:   ASSISTANTS: none   ANESTHESIA:   spinal  EBL:  Total I/O In: 900 [I.V.:900] Out: 350 [Urine:200; Blood:150]  BLOOD ADMINISTERED:none  DRAINS: Urinary Catheter (Foley) and vaginal packing   LOCAL MEDICATIONS USED:  NONE  SPECIMEN:  Source of Specimen:  uterus and cervix  DISPOSITION OF SPECIMEN:  PATHOLOGY  COUNTS:  YES  TOURNIQUET:  * No tourniquets in log *  DICTATION: .Other Dictation: Dictation Number [id number]  PLAN OF CARE: Admit for overnight observation  PATIENT DISPOSITION:  PACU - hemodynamically stable.   Delay start of Pharmacological VTE agent (>24hrs) due to surgical blood loss or risk of bleeding: not applicable

## 2015-05-24 NOTE — Anesthesia Postprocedure Evaluation (Signed)
  Anesthesia Post-op Note  Patient: Chelsea Vega  Procedure(s) Performed: Procedure(s) with comments: ANTERIOR (CYSTOCELE) AND POSTERIOR REPAIR (RECTOCELE) (N/A) HYSTERECTOMY VAGINAL - uterus and cervix only  Patient Location: PACU  Anesthesia Type:Spinal  Level of Consciousness: awake, alert  and oriented  Airway and Oxygen Therapy: Patient Spontanous Breathing  Post-op Pain: none  Post-op Assessment: Post-op Vital signs reviewed, Patient's Cardiovascular Status Stable, Respiratory Function Stable, Patent Airway, No signs of Nausea or vomiting, Pain level controlled, No headache, No backache and Spinal receding LLE Motor Response: Purposeful movement LLE Sensation: Tingling RLE Motor Response: Purposeful movement RLE Sensation: Tingling      Post-op Vital Signs: Reviewed and stable  Last Vitals:  Filed Vitals:   05/24/15 0915  BP: 97/61  Pulse: 60  Temp:   Resp: 14    Complications: No apparent anesthesia complications

## 2015-05-24 NOTE — Op Note (Signed)
NAMEKAILY, YAMADA                  ACCOUNT NO.:  [id number]  MEDICAL RECORD NO.:  [id number]  LOCATION:  9303                          FACILITY:  [facility]  PHYSICIAN:  Powell Halbert L. Brayant Dorr, M.D.DATE OF BIRTH:  09-04-1955  DATE OF PROCEDURE:  05/24/2015 DATE OF DISCHARGE:                              OPERATIVE REPORT   PREOPERATIVE DIAGNOSIS:  Pelvic organ prolapse.  POSTOPERATIVE DIAGNOSIS:  Pelvic organ prolapse.  PROCEDURE:  Total vaginal hysterectomy, McCall cul-de-sac culdoplasty, anterior and posterior repair.  SURGEON:  Bard Haupert L. Helane Rima, M.D.  ASSISTANT:  Dr. Lynnette Caffey.  EBL:  150 mL.  ANESTHESIA:  Spinal.  PATHOLOGY:  Uterus and cervix sent to Pathology.  DRAINS:  Foley catheter and vaginal packing.  DESCRIPTION OF PROCEDURE:  The patient was taken to the operating room after consent was obtained.  She was then prepped and draped after spinal was inserted.  A Foley catheter was inserted.  Exam under anesthesia revealed she had a grade 2 cystocele, grade 2 uterine prolapse, and grade 1 to 2 rectocele.  A weighted speculum was placed in the vagina.  The cervix was grasped with a tenaculum and a circumferential incision was made around the cervix with Bovie.  The posterior and anterior cul-de-sacs were entered using Mayo scissors. Curved Heaney clamp was used to clamp the uterosacral cardinal ligament complexes on either side.  Each pedicle was clamped, cut, and suture ligated using 0-Vicryl suture.  We walked way up the broad ligament staying snug besides cervix and uterus.  Each pedicle was clamped, cut, and suture ligated using 0-Vicryl suture.  We then retroflexed the uterus because it was rather small.  The remainder of the triple pedicle and broad ligament were clamped, the specimen was removed, and identified the cervix and uterus.  The pedicles were secured with a free tie and a suture ligature of 0-Vicryl suture.  At this point, the Allis clamp was placed at the  vaginal apex at 3 and 9 o'clock.  Figure-of- eight stitch was placed at each site and then a McCall cul-de-sac culdoplasty was performed in a standard fashion using 0-Vicryl suture. We then proceeded with an anterior colporrhaphy by making a midline incision in the anterior vaginal wall and dissecting the vesicovaginal fascia away from the overlying vaginal epithelium reducing the cystocele with sharp and blunt dissection.  The cystocele was then further reduced with interrupted using 2-0 Vicryl suture.  After the cystocele was reduced, the redundant vaginal epithelium was trimmed and then that portion of the incision was closed using 0-Vicryl in a running locked stitch.  We then proceeded with closure of the posterior cuff and a running stitch using 0-Vicryl suture and the remainder of the cuff was then closed using 0-Vicryl in a running locked stitch.  Hemostasis was very good.  The McCall stitch was tied down and she had excellent apical support.  At this point, I placed Allis clamps at the 5 and 7 o'clock position in the perineum, made a V-shaped incision with a scalpel and then made a midline incision all the way up the posterior vaginal wall using Metzenbaum scissors.  The rectovaginal fascia was then dissected free with sharp and  blunt dissection.  The rectocele was then further reduced using interrupted 0-Vicryl suture.  After approximation and reduction of the rectocele, the redundant vaginal epithelium was trimmed and then the incision was closed in a running locked stitch using 0- Vicryl suture.  At the end of the procedure, hemostasis was very good. She had excellent support anteriorly and posteriorly and apically.  The urine output was clear and moderate in amount.  The vaginal packing with Estrace cream was inserted into the vagina.  The patient tolerated the procedure very well.  All sponge, lap, and instrument counts were correct x2.  The patient went to the recovery room  in stable condition.     Zamyah Wiesman L. Helane Rima, M.D.     Nevin Bloodgood  D:  05/24/2015  T:  05/24/2015  Job:  [id number]

## 2015-05-25 ENCOUNTER — Encounter (HOSPITAL_COMMUNITY): Payer: Self-pay | Admitting: Obstetrics and Gynecology

## 2015-05-25 DIAGNOSIS — N812 Incomplete uterovaginal prolapse: Secondary | ICD-10-CM | POA: Diagnosis not present

## 2015-05-25 LAB — CBC
HCT: 32.1 % — ABNORMAL LOW (ref 36.0–46.0)
Hemoglobin: 10.5 g/dL — ABNORMAL LOW (ref 12.0–15.0)
MCH: 30.9 pg (ref 26.0–34.0)
MCHC: 32.7 g/dL (ref 30.0–36.0)
MCV: 94.4 fL (ref 78.0–100.0)
PLATELETS: 164 10*3/uL (ref 150–400)
RBC: 3.4 MIL/uL — ABNORMAL LOW (ref 3.87–5.11)
RDW: 12.4 % (ref 11.5–15.5)
WBC: 9.9 10*3/uL (ref 4.0–10.5)

## 2015-05-25 MED ORDER — IBUPROFEN 600 MG PO TABS: 600.0000 mg | ORAL_TABLET | Freq: Four times a day (QID) | ORAL | Status: AC | PRN

## 2015-05-25 NOTE — Discharge Summary (Signed)
  Admission Diagnosis: Pelvic Organ Prolapse  Discharge Diagnosis: Same  Hospital: 59 year old female with pelvic organ prolapse.  Patient had an uneventful surgery and post op course. She was ambulating and voiding and had good pain control on Ibuprofen. Her post op hemoglobin was 10 on POD #1.    BP 86/50 mmHg  Pulse 53  Temp(Src) 98.8 F (37.1 C) (Oral)  Resp 18  Ht 5\' 5"  (1.651 m)  Wt 123 lb (55.792 kg)  BMI 20.47 kg/m2  SpO2 96%  LMP  Abdomen is soft and non tender No vaginal bleeding  Results for orders placed or performed during the hospital encounter of 05/24/15 (from the past 24 hour(s))  CBC     Status: Abnormal   Collection Time: 05/25/15  5:10 AM  Result Value Ref Range   WBC 9.9 4.0 - 10.5 K/uL   RBC 3.40 (L) 3.87 - 5.11 MIL/uL   Hemoglobin 10.5 (L) 12.0 - 15.0 g/dL   HCT 32.1 (L) 36.0 - 46.0 %   MCV 94.4 78.0 - 100.0 fL   MCH 30.9 26.0 - 34.0 pg   MCHC 32.7 30.0 - 36.0 g/dL   RDW 12.4 11.5 - 15.5 %   Platelets 164 150 - 400 K/uL   She was discharged home in good condition She was given Ibuprofen for pain She will do sitz baths twice daily Follow up in 1 week

## 2015-05-25 NOTE — Progress Notes (Signed)
Pt verbalizes understanding of d/c instructions, medications, follow up appts, when to seek medical attn and belongings policy. IV was d/c without complications. Pt was d/c to main entrance accompanied by NT. Pts husband is with her and will be driving her home. Marry Guan

## 2015-05-25 NOTE — Plan of Care (Signed)
Problem: Bowel/Gastric: Goal: Gastrointestinal status for postoperative course will improve Outcome: Completed/Met Date Met:  05/25/15 Pt is passing flatus, not having N/V.  Problem: Education: Goal: Knowledge of the prescribed therapeutic regimen will improve Outcome: Completed/Met Date Met:  05/25/15 Reviewed Recovering from Surgery Book with patient, including possible complications following surgery, and limitations to activity. Chelsea Vega

## 2015-05-25 NOTE — Plan of Care (Signed)
Problem: Skin Integrity: Goal: Demonstration of wound healing without infection will improve Outcome: Completed/Met Date Met:  05/25/15 Pt verbalizes understanding of signs of infection, but does not have incision. Chelsea Vega      Problem: Urinary Elimination: Goal: Ability to reestablish a normal urinary elimination pattern will improve Outcome: Completed/Met Date Met:  05/25/15 Pt was unable to void, catheter was reinserted per physician order. Pt will follow up with Dr. Helane Rima on Monday to have catheter removed. Cath care was reviewed with patient, who verbalizes understanding.

## 2016-04-21 ENCOUNTER — Encounter: Payer: 59 | Admitting: Sports Medicine

## 2016-04-23 ENCOUNTER — Ambulatory Visit (INDEPENDENT_AMBULATORY_CARE_PROVIDER_SITE_OTHER): Payer: 59 | Admitting: Sports Medicine

## 2016-04-23 ENCOUNTER — Encounter: Payer: Self-pay | Admitting: Sports Medicine

## 2016-04-23 DIAGNOSIS — M76829 Posterior tibial tendinitis, unspecified leg: Secondary | ICD-10-CM

## 2016-04-24 NOTE — Progress Notes (Signed)
   Subjective:    Patient ID: Chelsea Vega, female    DOB: [birth year], 60 y.o.   MRN: [id number]  HPI chief complaint: Ankle pain  60 year old female comes in today at the request of Dr. Noemi Chapel for consideration of custom orthotics. She has been having ankle pain for quite some time. In fact, she had an MRI done recently to rule out stress fracture. Results of the MRI are unknown. Her pain is primarily along the medial ankle. No numbness or tingling. She has not noticed any swelling.    Review of Systems     Objective:   Physical Exam Well-developed, well-nourished. No acute distress  Examination of both feet show fairly well-preserved longitudinal arches. She does have a collapse of her transverse arches. She is tender to palpation along the course of the posterior tibialis tendon. No swelling. No tenderness to palpation or percussion over the distal tibia.  Evaluation of her running form shows pretty good form. I did not appreciate any significant pronation or supination. She runs without a limp.  X-rays from Dr. Archie Endo office are reviewed. They're unremarkable. MRI is pending       Assessment & Plan:   Ankle pain secondary to posterior tibialis tendon strain versus distal tibial stress fracture  We will go ahead and presumptively treat this like posterior tibialis tendinitis until she gets the results of her MRI. We will put her in a pair of green sports insoles with scaphoid pads. I've given her some eccentric exercises to start doing at home. She will do these daily. Follow-up with me in one month. If the MRI of her ankle does not show any stress fracture, then we will consider custom orthotics at that time.

## 2016-05-21 ENCOUNTER — Ambulatory Visit: Payer: 59 | Admitting: Sports Medicine

## 2016-11-03 ENCOUNTER — Ambulatory Visit (INDEPENDENT_AMBULATORY_CARE_PROVIDER_SITE_OTHER): Payer: 59 | Admitting: Sports Medicine

## 2016-11-03 ENCOUNTER — Encounter: Payer: Self-pay | Admitting: Sports Medicine

## 2016-11-03 VITALS — BP 120/54 | Ht 65.0 in | Wt 123.0 lb

## 2016-11-03 DIAGNOSIS — M19072 Primary osteoarthritis, left ankle and foot: Secondary | ICD-10-CM

## 2016-11-03 MED ORDER — MELOXICAM 7.5 MG PO TABS: 7.5000 mg | ORAL_TABLET | Freq: Every day | ORAL | 1 refills | Status: DC

## 2016-11-03 NOTE — Progress Notes (Signed)
   Subjective:    Patient ID: Chelsea Vega, female    DOB: 1955-12-29, 61 y.o.   MRN: [id number]  HPI   Patient comes in today for follow-up on left ankle pain. When I last saw her in the office in October she was awaiting an MRI of her left ankle. The MRI shows primarily degenerative changes in the ankle joint. She also appears to have an interosseous ganglion in the distal tibia. No stress fracture is seen. Posterior tibialis tendon is unremarkable. She has been using her green sports insoles and has found them to be somewhat comfortable. Her main question today is whether or not she should continue running on an arthritic ankle. She takes 7.5 mg of meloxicam daily. She has not noticed any swelling. She continues to localize her pain to the medial ankle.    Review of Systems As above    Objective:   Physical Exam  Well-developed, well-nourished. No acute distress. Awake alert and oriented 3.  Left ankle: Full range of motion. No effusion. No soft tissue swelling. She is tender to palpation along the distal tip of the tibia. No tenderness along the peroneal tendons. Good ankle stability. Neurovascularly intact distally. Walking without a limp.  X-ray results from 04/19/2016 are as above. Please note that the MRI was available for my personal review.      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 I did explain to her that running on an arthritic joint can accelerate the process of degeneration but if she would like to try to return to some limited running then I think that would be fine. I also discussed custom orthotics. She would like to go ahead and proceed with that. We will schedule a follow-up appointment in a week or two for that purpose. In the meantime, I did refill her meloxicam but I cautioned her about taking it daily. I would like for her to start using it on more of an as-needed  basis. She understands.

## 2016-11-04 ENCOUNTER — Encounter: Payer: Self-pay | Admitting: Sports Medicine

## 2016-11-24 ENCOUNTER — Encounter: Payer: Self-pay | Admitting: Sports Medicine

## 2016-11-24 ENCOUNTER — Ambulatory Visit (INDEPENDENT_AMBULATORY_CARE_PROVIDER_SITE_OTHER): Payer: 59 | Admitting: Sports Medicine

## 2016-11-24 VITALS — BP 107/64 | Ht 65.0 in | Wt 125.0 lb

## 2016-11-24 DIAGNOSIS — M19072 Primary osteoarthritis, left ankle and foot: Secondary | ICD-10-CM | POA: Diagnosis not present

## 2016-11-24 DIAGNOSIS — S62182A Displaced fracture of trapezoid [smaller multangular], left wrist, initial encounter for closed fracture: Secondary | ICD-10-CM | POA: Diagnosis not present

## 2016-11-24 NOTE — Progress Notes (Signed)
  Patient is seen today for custom orthotics. Please see previous office notes for details regarding history and physical exam findings. In short, this patient has a history of ankle DJD. She is doing well with a pair of temporary green inserts and scaphoid pads. She is ready now to progress to custom orthotics.  Custom orthotics were created for her today. A total of 30 minutes was spent with the patient with greater than 50% of our time spent in face-to-face consultation discussing orthotic construction, instruction, and fitting. She found the orthotics to be quite comfortable prior to leaving the office. Gait was neutral in the orthotics. Follow-up as needed.  Patient was fitted for a : standard, cushioned, semi-rigid orthotic. The orthotic was heated and afterward the patient stood on the orthotic blank positioned on the orthotic stand. The patient was positioned in subtalar neutral position and 10 degrees of ankle dorsiflexion in a weight bearing stance. After completion of molding, a stable base was applied to the orthotic blank. The blank was ground to a stable position for weight bearing. Size: 7 Base: Blue EVA Posting: none Additional orthotic padding: none

## 2017-01-29 DIAGNOSIS — Z0000 Encounter for general adult medical examination without abnormal findings: Secondary | ICD-10-CM | POA: Diagnosis not present

## 2017-01-29 DIAGNOSIS — M810 Age-related osteoporosis without current pathological fracture: Secondary | ICD-10-CM | POA: Diagnosis not present

## 2017-01-29 DIAGNOSIS — Z136 Encounter for screening for cardiovascular disorders: Secondary | ICD-10-CM | POA: Diagnosis not present

## 2017-03-04 DIAGNOSIS — Z6821 Body mass index (BMI) 21.0-21.9, adult: Secondary | ICD-10-CM | POA: Diagnosis not present

## 2017-03-04 DIAGNOSIS — Z01419 Encounter for gynecological examination (general) (routine) without abnormal findings: Secondary | ICD-10-CM | POA: Diagnosis not present

## 2017-03-10 ENCOUNTER — Other Ambulatory Visit: Payer: Self-pay | Admitting: Obstetrics and Gynecology

## 2017-03-10 DIAGNOSIS — R928 Other abnormal and inconclusive findings on diagnostic imaging of breast: Secondary | ICD-10-CM

## 2017-03-12 ENCOUNTER — Ambulatory Visit: Payer: 59

## 2017-03-12 ENCOUNTER — Ambulatory Visit
Admission: RE | Admit: 2017-03-12 | Discharge: 2017-03-12 | Disposition: A | Discharge status: Home / Self Care | Payer: 59 | Source: Ambulatory Visit | Attending: Obstetrics and Gynecology | Admitting: Obstetrics and Gynecology

## 2017-03-12 DIAGNOSIS — R922 Inconclusive mammogram: Secondary | ICD-10-CM | POA: Diagnosis not present

## 2017-03-12 DIAGNOSIS — R928 Other abnormal and inconclusive findings on diagnostic imaging of breast: Secondary | ICD-10-CM

## 2017-05-25 DIAGNOSIS — M62838 Other muscle spasm: Secondary | ICD-10-CM | POA: Diagnosis not present

## 2017-05-25 DIAGNOSIS — J069 Acute upper respiratory infection, unspecified: Secondary | ICD-10-CM | POA: Diagnosis not present

## 2017-05-25 DIAGNOSIS — R42 Dizziness and giddiness: Secondary | ICD-10-CM | POA: Diagnosis not present

## 2017-05-27 ENCOUNTER — Telehealth: Payer: Self-pay

## 2017-05-27 NOTE — Telephone Encounter (Signed)
Sent notes to scheduling 

## 2017-06-03 ENCOUNTER — Encounter: Payer: Self-pay | Admitting: Cardiovascular Disease

## 2017-06-08 ENCOUNTER — Other Ambulatory Visit: Payer: Self-pay

## 2017-06-08 ENCOUNTER — Ambulatory Visit: Payer: 59 | Attending: Physician Assistant | Admitting: Physical Therapy

## 2017-06-08 DIAGNOSIS — R252 Cramp and spasm: Secondary | ICD-10-CM | POA: Insufficient documentation

## 2017-06-08 DIAGNOSIS — M542 Cervicalgia: Secondary | ICD-10-CM | POA: Insufficient documentation

## 2017-06-08 DIAGNOSIS — R42 Dizziness and giddiness: Secondary | ICD-10-CM | POA: Diagnosis not present

## 2017-06-08 NOTE — Therapy (Signed)
[facility] [address]  [place], [place], [address] Phone: [phone]   Fax:  [phone]  Physical Therapy Evaluation  Patient Details  Name: Chelsea Vega MRN: [id number] Date of Birth: June 23, 1956 Referring Provider: Mat Carne, PA-C   Encounter Date: 06/08/2017  PT End of Session - 06/08/17 1710    Visit Number  1    Number of Visits  12    Date for PT Re-Evaluation  07/24/17    Authorization Type  [employer]    Authorization - Number of Visits  60    PT Start Time  1324    PT Stop Time  1804    PT Time Calculation (min)  54 min    Activity Tolerance  Patient tolerated treatment well;Treatment limited secondary to medical complications (Comment)       Past Medical History:  Diagnosis Date  . Abnormal vaginal Pap smear    DISTANT HX- S/P LEEP  . Allergic rhinitis   . Anemia    pregnancy  . Asthma    childhood, early 20's  . Constipation   . Depression   . Dysrhythmia    occassional PVC's  . GERD (gastroesophageal reflux disease)   . Hx of cystitis    RECURRENT, DR. TANNENBAUM  . Insomnia   . Osteoporosis 10/2008   DR. [place], [place] 10/2008  . Postmenopausal   . PVC's (premature ventricular contractions)    HX OF  . Tinnitus   . Vitamin D deficiency     Past Surgical History:  Procedure Laterality Date  . ANKLE SURGERY    . ANTERIOR AND POSTERIOR REPAIR N/A 05/24/2015   Procedure: ANTERIOR (CYSTOCELE) AND POSTERIOR REPAIR (RECTOCELE);  Surgeon: Dian Queen, MD;  Location: [facility];  Service: Gynecology;  Laterality: N/A;  . DIAGNOSTIC LAPAROSCOPY  2012   removed tubes and ovaries  . GANGLION CYST EXCISION    . SEPTOPLASTY    . VAGINAL HYSTERECTOMY  05/24/2015   Procedure: HYSTERECTOMY VAGINAL;  Surgeon: Dian Queen, MD;  Location: [facility];  Service: Gynecology;;  uterus and cervix only    There were no vitals filed for this visit.   Subjective Assessment - 06/08/17 1716    Subjective  Pt reports neck started flaring up back in May. Initially fell asleep with head resting on end of sofa and woke up with a stiff neck. Had a massage at the time that seemed to help but pain has gradualy gotten worse since. Also noting some dizzy spells with head motions to R (checking blind spot ot blow drying hair, but denies dizziness with sit <> supine transitons).     Patient Stated Goals  "to get rid of the pain in my neck"    Currently in Pain?  Yes    Pain Score  2     Pain Location  Neck    Pain Orientation  Right    Pain Descriptors / Indicators  Tightness;Sharp    Pain Type  Acute pain    Pain Radiating Towards  n/a    Pain Onset  More than a month ago    Pain Frequency  Intermittent    Aggravating Factors   rotating head to right, sleeping on right side    Pain Relieving Factors  rolling to other side in bed, ibuprofen, self massage    Effect of Pain on Daily Activities  avoids carrying things on R shoulder, avoids sleeping on R side; difficulty looking to check  behind while driving (pain and dizziness with this)         [facility] PT Assessment - 06/08/17 1710      Assessment   Medical Diagnosis  Neck pain & trapezius strain; dizziness    Referring Provider  Mat Carne, PA-C    Onset Date/Surgical Date  -- May 2018    Hand Dominance  Right    Next MD Visit  3 month    Prior Therapy  none for current condition; prior PT for ankle      Balance Screen   Has the patient fallen in the past 6 months  No    Has the patient had a decrease in activity level because of a fear of falling?   No    Is the patient reluctant to leave their home because of a fear of falling?   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 on daily basis    Leisure  walking 2-5 miles daily      Observation/Other Assessments   Focus on Therapeutic Outcomes (FOTO)   Neck 59% (41% limitation); predicted 66% (34% limitation)      Sensation   Light Touch  Appears Intact      Posture/Postural Control   Posture/Postural Control  Postural limitations    Postural Limitations  Forward head;Rounded Shoulders      ROM / Strength   AROM / PROM / Strength  AROM;Strength      AROM   AROM Assessment Site  Cervical;Shoulder    Right/Left Shoulder  Right;Left    Right Shoulder Flexion  134 Degrees    Left Shoulder Flexion  145 Degrees    Cervical Flexion  52    Cervical Extension  61    Cervical - Right Side Bend  30    Cervical - Left Side Bend  25    Cervical - Right Rotation  68    Cervical - Left Rotation  80      Strength   Overall Strength Comments  B shoulders WFL ~4+/5    Strength Assessment Site  Shoulder         Vestibular Assessment - 06/08/17 1710      Symptom Behavior   Type of Dizziness  Spinning    Frequency of Dizziness  multiple times per day    Duration of Dizziness  very brief    Aggravating Factors  Turning head quickly;Turning body quickly    Relieving Factors  No known relieving factors      Positional Testing   Dix-Hallpike  Dix-Hallpike Right      Dix-Hallpike Right   Dix-Hallpike Right Duration  <10 sec    Dix-Hallpike Right Symptoms  -- dizziness, no visible nystagmus         Objective measurements completed on examination: See above findings.      [facility] Adult PT Treatment/Exercise - 06/08/17 1710  Self-Care   Self-Care  Posture    Posture  Provided edcuation on neutral head and shoulder posture to avoid excess strain on neck musculature      Exercises   Exercises  Neck      Neck Exercises:  Seated   Neck Retraction  10 reps;5 secs    Other Seated Exercise  scap retraction 10x5"      Neck Exercises: Stretches   Upper Trapezius Stretch  30 seconds;1 rep    Upper Trapezius Stretch Limitations  seated with arm behind back and slight overpressure    Levator Stretch  30 seconds;1 rep    Levator Stretch Limitations  seated with arm behind back    Corner Stretch  30 seconds;1 rep Statistician Limitations  3 way doorway stretch      Vestibular Treatment/Exercise - 06/08/17 1710      Vestibular Treatment/Exercise   Vestibular Treatment Provided  Canalith Repositioning    Canalith Repositioning  Epley Manuever Right       EPLEY MANUEVER RIGHT   Number of Reps   1    Overall Response  No change    Response Details   pt still experiencing dizziness with turning upon leaving therapy session            PT Education - 06/08/17 1800    Education provided  Yes    Education Details  PT eval findings, anticipated POC & initial HEP    Person(s) Educated  Patient    Methods  Explanation;Demonstration;Handout    Comprehension  Verbalized understanding;Returned demonstration;Need further instruction          PT Long Term Goals - 06/08/17 1810      PT LONG TERM GOAL #1   Title  Independent with ongoing HEP    Status  New    Target Date  07/24/17      PT LONG TERM GOAL #2   Title  Pt will verbalize/demonstrate understanding of neutral spine and shoulder posture to decrease cervical muscle strain    Status  New    Target Date  07/24/17      PT LONG TERM GOAL #3   Title  Cerivcal ROM WFL w/o limitation due to pain or muscle tightness    Status  New    Target Date  07/24/17      PT LONG TERM GOAL #4   Title  R shoulder flexion ROM equivalent to L shoudler    Status  New    Target Date  07/24/17      PT LONG TERM GOAL #5   Title  Pt will report no sleep disturbance while lying on either side due to neck pain    Status  New    Target Date  07/24/17       PT LONG TERM GOAL #6   Title  Pt will report ability to turn her head adequately to check her blindspot while driving w/o limitation due to neck pain or dizziness    Status  New    Target Date  07/24/17             Plan - 06/08/17 1804    Clinical Impression Statement  Chelsea Vega is a 61 y/o right hand dominant female who presents to OP PT for right sided neck pain with R trapezius muscle spasm. Pain originated in May 2018 after falling asleep with head cocked up on end of sofa. Initially relieved somewhat with massage received at  the time, but has noted worsening pain recently. Cervical ROM mildly limited in L sidebending and R rotation with slight limitation also noted in R shoulder flexion relative to L, however B shoulder strength grossly 4+/5. Increased muscle tension present in R UT, LS and pecs with pt demonstrating fwd head and rounded shoulder posture with R shoulder more forward than L. Neck pain limits ability to carry things on R, limits her ability to sleep on R side and causes difficulty turning head to look behind her while driving. Pt also notes dizziness mostly commonly associated with turning head to right side, which she states she has been told could be a side-effect of weaning from her Zoloft. Brief vestibular assessment revealed R Dix Hallpike positive for dizziness but no nystagmus, although no change in dizziness noted after R Epley maneuver performed. Saidi has good potential to benefit from PT to improve pain, posture and cervical ROM to restore normal function and reduce risk of reinjury, with further evaluation of dizziness as it impacts her therapy for her neck.    History and Personal Factors relevant to plan of care:  osteoporosis, medical history as above    Clinical Presentation  Evolving    Clinical Presentation due to:  worsening of neck pain along with unpredictability of dizziness    Clinical Decision Making  Moderate    Rehab Potential  Good    Clinical Impairments  Affecting Rehab Potential  dizziness of uncertain origin, osteoporosis    PT Frequency  2x / week    PT Duration  8 weeks    PT Treatment/Interventions  Patient/family education;ADLs/Self Care Home Management;Neuromuscular re-education;Therapeutic exercise;Therapeutic activities;Manual techniques;Passive range of motion;Dry needling;Taping;Electrical Stimulation;Moist Heat;Cryotherapy;Iontophoresis 4mg /ml Dexamethasone;Vestibular;Canalith Repostioning    Consulted and Agree with Plan of Care  Patient       Patient will benefit from skilled therapeutic intervention in order to improve the following deficits and impairments:  Pain, Decreased range of motion, Impaired flexibility, Increased muscle spasms, Impaired UE functional use, Decreased activity tolerance, Postural dysfunction, Improper body mechanics, Dizziness  Visit Diagnosis: Cervicalgia  Cramp and spasm  Dizziness and giddiness     Problem List Patient Active Problem List   Diagnosis Date Noted  . 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 Ankle sprain 03/06/2011    Percival Spanish, PT, MPT 06/08/2017, 7:03 PM  [facility] [address]  [place], [place], [address] Phone: [phone]   Fax:  [phone]  Name: Chelsea Vega MRN: [id number] Date of Birth: 03-27-1956

## 2017-06-10 NOTE — Progress Notes (Signed)
Cardiology Office Note   Date:  06/15/2017   ID:  Chelsea Vega, DOB 03-13-1956, MRN [id number]  PCP:  Orpah Melter, MD  Cardiologist:   Jenkins Rouge, MD   No chief complaint on file.     History of Present Illness: Chelsea Vega is a 61 y.o. female who presents for consultation regarding dizziness. Referred by Dr Olen Pel And Mat Carne [facility]. Referral notes bradycardia and intermittent dizziness Reviewed office note from 05/25/17  Patient with URI. Dizziness occurs randomly for a few seconds not related to body position or head movement. No vertigo like symptoms. No palpitations, dyspnea or chest pain. Has had 8 months of right sided neck pain. Tightness  Noted first after sleeping on neck wrong. Some help with chiropractic care. Has had sinus infections a lot during this Time of year. Noted nasal congestion and headache. Rx with azithromycin ECG notes sinus bradycardia rate 49 otherwise totally normal QT 413.   Her symptoms are non cardiac and coincide some with tapering her zoloft and a sinus infection. Feels her eyes moveing And brain "shaking" for a second or two. No frank syncope no frank vertigo Symptoms slowly improving on there own No palpitaitons dyspnea chest pain   Past Medical History:  Diagnosis Date  . Abnormal vaginal Pap smear    DISTANT HX- S/P LEEP  . Allergic rhinitis   . Anemia    pregnancy  . Asthma    childhood, early 20's  . Constipation   . Depression   . Dysrhythmia    occassional PVC's  . GERD (gastroesophageal reflux disease)   . Hx of cystitis    RECURRENT, DR. TANNENBAUM  . Insomnia   . Osteoporosis 10/2008   DR. [place], [place] 10/2008  . Postmenopausal   . PVC's (premature ventricular contractions)    HX OF  . Slow heart rate   . Tinnitus   . Trapezius muscle spasm   . Vitamin D deficiency     Past Surgical History:  Procedure Laterality Date  . ANKLE SURGERY    . ANTERIOR AND POSTERIOR REPAIR N/A 05/24/2015   Procedure:  ANTERIOR (CYSTOCELE) AND POSTERIOR REPAIR (RECTOCELE);  Surgeon: Dian Queen, MD;  Location: [facility];  Service: Gynecology;  Laterality: N/A;  . DIAGNOSTIC LAPAROSCOPY  2012   removed tubes and ovaries  . GANGLION CYST EXCISION    . SEPTOPLASTY    . VAGINAL HYSTERECTOMY  05/24/2015   Procedure: HYSTERECTOMY VAGINAL;  Surgeon: Dian Queen, MD;  Location: [facility];  Service: Gynecology;;  uterus and cervix only     Current Outpatient Medications  Medication Sig Dispense Refill  . Calcium Carbonate (CALTRATE 600 PO) Take 1 tablet by mouth daily.      . Cholecalciferol 4000 units CAPS Take 1 capsule by mouth daily.    Marland Kitchen EPINEPHrine (EPIPEN 2-PAK) 0.3 mg/0.3 mL IJ SOAJ injection Inject 0.3 mg into the muscle once.    . Estradiol (ELESTRIN) 0.52 MG/0.87 GM (0.06%) GEL Place 1 application onto the skin daily.     . fexofenadine-pseudoephedrine (ALLEGRA-D 24) 180-240 MG 24 hr tablet Take 1 tablet by mouth daily.    . fluticasone (FLONASE) 50 MCG/ACT nasal spray Place 1 spray into both nostrils daily.     Marland Kitchen ibuprofen (ADVIL,MOTRIN) 600 MG tablet Take 1 tablet (600 mg total) by mouth every 6 (six) hours as needed (mild pain). 60 tablet 0  . loratadine (CLARITIN) 10 MG tablet Take 10 mg by mouth daily.    Marland Kitchen  LORazepam (ATIVAN) 0.5 MG tablet Take 0.5 mg by mouth 2 (two) times daily as needed for anxiety.    Marland Kitchen OVER THE COUNTER MEDICATION Take 1 tablet by mouth every other day. Vitamin D 10000 units and Vitamin K2    . ranitidine (ZANTAC) 75 MG tablet Take 75 mg by mouth daily as needed for heartburn.    . triamcinolone cream (KENALOG) 0.1 % Apply 1 application topically 2 (two) times daily as needed (rash).     No current facility-administered medications for this visit.     Allergies:   Flagyl [metronidazole]    Social History:  The patient  reports that  has never smoked. she has never used smokeless tobacco. She reports that she drinks alcohol. She reports that she does not use drugs.    Family History:  The patient's family history is not on file.    ROS:  Please see the history of present illness.   Otherwise, review of systems are positive for none.   All other systems are reviewed and negative.    PHYSICAL EXAM: VS:  BP 112/78   Pulse (!) 58   Ht 5\' 5"  (1.651 m)   Wt 127 lb 8 oz (57.8 kg)   SpO2 99%   BMI 21.22 kg/m  , BMI Body mass index is 21.22 kg/m. Affect appropriate Healthy:  appears stated age 89: normal Neck supple with no adenopathy JVP normal no bruits no thyromegaly Lungs clear with no wheezing and good diaphragmatic motion Heart:  S1/S2 no murmur, no rub, gallop or click PMI normal Abdomen: benighn, BS positve, no tenderness, no AAA no bruit.  No HSM or HJR Distal pulses intact with no bruits No edema Neuro non-focal Skin warm and dry No muscular weakness    EKG:  SB rate 49 normal  06/15/17 SR rate 67 normal    Recent Labs: No results found for requested labs within last 8760 hours.    Lipid Panel No results found for: CHOL, TRIG, HDL, CHOLHDL, VLDL, LDLCALC, LDLDIRECT    Wt Readings from Last 3 Encounters:  06/15/17 127 lb 8 oz (57.8 kg)  11/24/16 125 lb (56.7 kg)  11/03/16 123 lb (55.8 kg)      Other studies Reviewed: Additional studies/ records that were reviewed today include: notes Dr Olen Pel Notes [facility] 05/31/18 ECG .    ASSESSMENT AND PLAN:  1. Dizziness non cardiac related to zoloft taper and head cold. Observe for now 2. Sinus: post azithromycin Rx consider f/u CT for persistent symptoms  3. GERD:  Low carb diet continue zantac 4. Allergic Rhinitis :  Flonase PRN    Current medicines are reviewed at length with the patient today.  The patient does not have concerns regarding medicines.  The following changes have been made:  no change  Labs/ tests ordered today include: None  No orders of the defined types were placed in this encounter.    Disposition:   FU with cardiology 3 months if symptoms  persist     Signed, Jenkins Rouge, MD  06/15/2017 10:02 AM    [address] [place], [place], [place]  [address] Phone: [phone]; Fax: [phone]

## 2017-06-15 ENCOUNTER — Ambulatory Visit: Payer: 59 | Admitting: Cardiovascular Disease

## 2017-06-15 ENCOUNTER — Encounter: Payer: Self-pay | Admitting: Cardiovascular Disease

## 2017-06-15 VITALS — BP 112/78 | HR 58 | Ht 65.0 in | Wt 127.5 lb

## 2017-06-15 DIAGNOSIS — R42 Dizziness and giddiness: Secondary | ICD-10-CM

## 2017-06-15 NOTE — Patient Instructions (Addendum)
Medication Instructions:  Your physician recommends that you continue on your current medications as directed. Please refer to the Current Medication list given to you today.  Labwork: NONE  Testing/Procedures: NONE  Follow-Up: Your physician wants you to follow-up in: 3 months with Dr. Nishan.   If you need a refill on your cardiac medications before your next appointment, please call your pharmacy.    

## 2017-06-16 ENCOUNTER — Ambulatory Visit: Payer: 59 | Attending: Physician Assistant | Admitting: Physical Therapy

## 2017-06-16 ENCOUNTER — Encounter: Payer: Self-pay | Admitting: Physical Therapy

## 2017-06-16 DIAGNOSIS — M542 Cervicalgia: Secondary | ICD-10-CM | POA: Diagnosis not present

## 2017-06-16 DIAGNOSIS — R42 Dizziness and giddiness: Secondary | ICD-10-CM | POA: Diagnosis not present

## 2017-06-16 DIAGNOSIS — R252 Cramp and spasm: Secondary | ICD-10-CM | POA: Insufficient documentation

## 2017-06-16 NOTE — Therapy (Signed)
[facility] [address]  [place], [place], [address] Phone: [phone]   Fax:  [phone]  Physical Therapy Treatment  Patient Details  Name: Chelsea Vega MRN: [id number] Date of Birth: [birth year] Referring Provider: Mat Carne, PA-C   Encounter Date: 06/16/2017  PT End of Session - 06/16/17 1620    Visit Number  2    Number of Visits  12    Date for PT Re-Evaluation  07/24/17    Authorization Type  [employer]    Authorization - Number of Visits  60    PT Start Time  1620    PT Stop Time  1721    PT Time Calculation (min)  61 min    Activity Tolerance  Patient tolerated treatment well;Treatment limited secondary to medical complications (Comment)       Past Medical History:  Diagnosis Date  . Abnormal vaginal Pap smear    DISTANT HX- S/P LEEP  . Allergic rhinitis   . Anemia    pregnancy  . Asthma    childhood, early 20's  . Constipation   . Depression   . Dysrhythmia    occassional PVC's  . GERD (gastroesophageal reflux disease)   . Hx of cystitis    RECURRENT, DR. TANNENBAUM  . Insomnia   . Osteoporosis 10/2008   DR. [place], [place] 10/2008  . Postmenopausal   . PVC's (premature ventricular contractions)    HX OF  . Slow heart rate   . Tinnitus   . Trapezius muscle spasm   . Vitamin D deficiency     Past Surgical History:  Procedure Laterality Date  . ANKLE SURGERY    . ANTERIOR AND POSTERIOR REPAIR N/A 05/24/2015   Procedure: ANTERIOR (CYSTOCELE) AND POSTERIOR REPAIR (RECTOCELE);  Surgeon: Dian Queen, MD;  Location: [facility];  Service: Gynecology;  Laterality: N/A;  . DIAGNOSTIC LAPAROSCOPY  2012   removed tubes and ovaries  . GANGLION CYST EXCISION    . SEPTOPLASTY    . VAGINAL HYSTERECTOMY  05/24/2015   Procedure: HYSTERECTOMY VAGINAL;  Surgeon: Dian Queen, MD;  Location: [facility];  Service: Gynecology;;  uterus and cervix only    There were no vitals filed for this  visit.  Subjective Assessment - 06/16/17 1624    Subjective  Pt reporting some increased pain with one of the HEP stretches that lingers after completing the stretch. Reports dizziness is less intense overall but still present at times.    Patient Stated Goals  "to get rid of the pain in my neck"    Currently in Pain?  Yes    Pain Score  2     Pain Location  Neck    Pain Orientation  Right    Pain Descriptors / Indicators  Tightness    Pain Type  Acute pain    Pain Onset  More than a month ago    Pain Frequency  Intermittent                      [facility] Adult PT Treatment/Exercise - 06/16/17 1620      Exercises   Exercises  Neck      Neck Exercises: Machines for Strengthening   UBE (Upper Arm Bike)  lvl 1.0 fwd/back x 3' each      Neck Exercises: Seated   Neck Retraction  10 reps;5 secs    Other Seated Exercise  scap retraction 10x5"  Modalities   Modalities  Electrical Stimulation;Moist Heat      Moist Heat Therapy   Number Minutes Moist Heat  15 Minutes    Moist Heat Location  Cervical;Shoulder Rt      Electrical Stimulation   Electrical Stimulation Location  R UT/shoulder complex    Electrical Stimulation Action  IFC    Electrical Stimulation Parameters  80-150 Hz, intensity to pt tol x15'    Electrical Stimulation Goals  Pain;Tone      Manual Therapy   Manual Therapy  Soft tissue mobilization;Myofascial release;Passive ROM;Manual Traction    Manual therapy comments  supine    Soft tissue mobilization  B UT, LS, cervical paraspinals & suboccipitals, R>L    Myofascial Release  TPR to R UT    Passive ROM  gentle cervical PROM/manual stretching in all planes    Manual Traction  gentle manual distraction 5x30"      Neck Exercises: Stretches   Upper Trapezius Stretch  30 seconds;2 reps    Upper Trapezius Stretch Limitations  seated with arm behind back, no overpressure    Levator Stretch  30 seconds;2 reps    Levator Stretch Limitations  seated with  arm behind back    Corner Stretch  30 seconds;1 rep each    Corner Stretch Limitations  3 way doorway stretch                  PT Long Term Goals - 06/16/17 1629      PT LONG TERM GOAL #1   Title  Independent with ongoing HEP    Status  On-going      PT LONG TERM GOAL #2   Title  Pt will verbalize/demonstrate understanding of neutral spine and shoulder posture to decrease cervical muscle strain    Status  On-going      PT LONG TERM GOAL #3   Title  Cerivcal ROM WFL w/o limitation due to pain or muscle tightness    Status  On-going      PT LONG TERM GOAL #4   Title  R shoulder flexion ROM equivalent to L shoudler    Status  On-going      PT LONG TERM GOAL #5   Title  Pt will report no sleep disturbance while lying on either side due to neck pain    Status  On-going      PT LONG TERM GOAL #6   Title  Pt will report ability to turn her head adequately to check her blindspot while driving w/o limitation due to neck pain or dizziness    Status  On-going            Plan - 06/16/17 1629    Clinical Impression Statement  [place] reporting dizziness intensity overall reduced following canalith repositioning on eval. Pt noting lingering pain/tightness after completing UT stretches with HEP, therefore reviewed technique. Pt able to perform UT stretch as well as remainder of HEP correctly, therefore encouraged pt to reduce intensity of stretch looking for only mild, gentle stretch w/o increased pain. Targeted manual therapy to cervical paraspinals and upper shoulder complex bilaterally, R>L, with increased muscle tension and taut bands/TPs indentified and addressed, Treatment completed with estim and moist heat to promote further muscle relaxation.    Rehab Potential  Good    Clinical Impairments Affecting Rehab Potential  dizziness of uncertain origin, osteoporosis    PT Treatment/Interventions  Patient/family education;ADLs/Self Care Home Management;Neuromuscular  re-education;Therapeutic exercise;Therapeutic activities;Manual techniques;Passive range of motion;Dry needling;Taping;Electrical  Stimulation;Moist Heat;Cryotherapy;Iontophoresis 4mg /ml Dexamethasone;Vestibular;Canalith Repostioning    Consulted and Agree with Plan of Care  Patient       Patient will benefit from skilled therapeutic intervention in order to improve the following deficits and impairments:  Pain, Decreased range of motion, Impaired flexibility, Increased muscle spasms, Impaired UE functional use, Decreased activity tolerance, Postural dysfunction, Improper body mechanics, Dizziness  Visit Diagnosis: Cervicalgia  Cramp and spasm  Dizziness and giddiness     Problem List Patient Active Problem List   Diagnosis Date Noted  . 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 Ankle sprain 03/06/2011    Percival Spanish, PT, MPT 06/16/2017, 7:39 PM  [facility] [address]  [place], [place], [address] Phone: [phone]   Fax:  [phone]  Name: LYDIA TOREN MRN: [id number] Date of Birth: [birth year]

## 2017-06-18 ENCOUNTER — Encounter: Payer: Self-pay | Admitting: Physical Therapy

## 2017-06-18 ENCOUNTER — Ambulatory Visit: Payer: 59 | Admitting: Physical Therapy

## 2017-06-18 DIAGNOSIS — M542 Cervicalgia: Secondary | ICD-10-CM

## 2017-06-18 DIAGNOSIS — R42 Dizziness and giddiness: Secondary | ICD-10-CM

## 2017-06-18 DIAGNOSIS — R252 Cramp and spasm: Secondary | ICD-10-CM

## 2017-06-18 NOTE — Therapy (Signed)
[facility] [address]  [place], [place], [address] Phone: [phone]   Fax:  [phone]  Physical Therapy Treatment  Patient Details  Name: Chelsea Vega MRN: [id number] Date of Birth: 1955/12/07 Referring Provider: Mat Carne, PA-C   Encounter Date: 06/18/2017  PT End of Session - 06/18/17 1625    Visit Number  3    Number of Visits  12    Date for PT Re-Evaluation  07/24/17    Authorization Type  UHC    Authorization - Number of Visits  49    PT Start Time  1625 pt arrived late, then using BR    PT Stop Time  1713    PT Time Calculation (min)  48 min    Activity Tolerance  Patient tolerated treatment well;Treatment limited secondary to medical complications (Comment)       Past Medical History:  Diagnosis Date  . Abnormal vaginal Pap smear    DISTANT HX- S/P LEEP  . Allergic rhinitis   . Anemia    pregnancy  . Asthma    childhood, early 20's  . Constipation   . Depression   . Dysrhythmia    occassional PVC's  . GERD (gastroesophageal reflux disease)   . Hx of cystitis    RECURRENT, DR. TANNENBAUM  . Insomnia   . Osteoporosis 10/2008   DR. [place], [place] 10/2008  . Postmenopausal   . PVC's (premature ventricular contractions)    HX OF  . Slow heart rate   . Tinnitus   . Trapezius muscle spasm   . Vitamin D deficiency     Past Surgical History:  Procedure Laterality Date  . ANKLE SURGERY    . ANTERIOR AND POSTERIOR REPAIR N/A 05/24/2015   Procedure: ANTERIOR (CYSTOCELE) AND POSTERIOR REPAIR (RECTOCELE);  Surgeon: Dian Queen, MD;  Location: [facility];  Service: Gynecology;  Laterality: N/A;  . DIAGNOSTIC LAPAROSCOPY  2012   removed tubes and ovaries  . GANGLION CYST EXCISION    . SEPTOPLASTY    . VAGINAL HYSTERECTOMY  05/24/2015   Procedure: HYSTERECTOMY VAGINAL;  Surgeon: Dian Queen, MD;  Location: [facility];  Service: Gynecology;;  uterus and cervix only    There were no vitals  filed for this visit.  Subjective Assessment - 06/18/17 1630    Subjective  Pt reporting pain flared up yesterday to 6-7/10 with worsening of dizziness. Better today but pain still elevated.    Patient Stated Goals  "to get rid of the pain in my neck"    Currently in Pain?  Yes    Pain Score  -- 3-4/10    Pain Location  Neck    Pain Orientation  Right    Pain Descriptors / Indicators  Aching;Burning    Pain Frequency  Constant                      [facility] Adult PT Treatment/Exercise - 06/18/17 1625      Self-Care   Self-Care  Other Self-Care Comments    Other Self-Care Comments   Provided education in set-up and use of home TENS/IT unit (pt using husband's unit), with pt taking pictures of electrode placement so husband may help her at home.      Exercises   Exercises  Neck      Neck Exercises: Machines for Strengthening   UBE (Upper Arm Bike)  lvl 1.0 fwd/back x 3' each  Neck Exercises: Theraband   Shoulder Extension  10 reps yellow    Shoulder Extension Limitations  standing    Rows  10 reps yellow    Rows Limitations  standing    Shoulder External Rotation  10 reps    Shoulder External Rotation Limitations  hooklying on pool noodle    Horizontal ABduction  10 reps yellow    Horizontal ABduction Limitations  hooklying on pool noodle    Other Theraband Exercises  al shoulder flexion/extension with yelow TB x10, hooklying on pool noodle      Modalities   Modalities  Electrical Stimulation;Moist Heat      Moist Heat Therapy   Number Minutes Moist Heat  15 Minutes    Moist Heat Location  Cervical;Shoulder Rt      Electrical Stimulation   Electrical Stimulation Location  R UT/shoulder complex    Electrical Stimulation Action  IFC    Electrical Stimulation Parameters  intensity to pt tol x15'    Electrical Stimulation Goals  Pain;Tone      Neck Exercises: Stretches   Other Neck Stretches  pec stretch hooklying on pool noodle (varying arm position) x 2'                   PT Long Term Goals - 06/16/17 1629      PT LONG TERM GOAL #1   Title  Independent with ongoing HEP    Status  On-going      PT LONG TERM GOAL #2   Title  Pt will verbalize/demonstrate understanding of neutral spine and shoulder posture to decrease cervical muscle strain    Status  On-going      PT LONG TERM GOAL #3   Title  Cerivcal ROM WFL w/o limitation due to pain or muscle tightness    Status  On-going      PT LONG TERM GOAL #4   Title  R shoulder flexion ROM equivalent to L shoudler    Status  On-going      PT LONG TERM GOAL #5   Title  Pt will report no sleep disturbance while lying on either side due to neck pain    Status  On-going      PT LONG TERM GOAL #6   Title  Pt will report ability to turn her head adequately to check her blindspot while driving w/o limitation due to neck pain or dizziness    Status  On-going            Plan - 06/18/17 1634    Clinical Impression Statement  Pt reporting worsening neck pain yesterday with concomitant increase in dizziness, suggesting at least a partial cervicogenic component to her dizziness. Better today but still elevated as compared to earlier this week. Avoided manual therapy today as pt still feeling tender from prior treatment ad instead focused on stretching, strengthening and scapular stabilization. Pt reporting no increase in pain with exercise efforts. Treatment concluded with training in use of home TENS/IT for continued pain management at home as needed.    Rehab Potential  Good    Clinical Impairments Affecting Rehab Potential  dizziness of uncertain origin, osteoporosis    PT Treatment/Interventions  Patient/family education;ADLs/Self Care Home Management;Neuromuscular re-education;Therapeutic exercise;Therapeutic activities;Manual techniques;Passive range of motion;Dry needling;Taping;Electrical Stimulation;Moist Heat;Cryotherapy;Iontophoresis 4mg /ml Dexamethasone;Vestibular;Canalith  Repostioning    Consulted and Agree with Plan of Care  Patient       Patient will benefit from skilled therapeutic intervention in order to improve the following  deficits and impairments:  Pain, Decreased range of motion, Impaired flexibility, Increased muscle spasms, Impaired UE functional use, Decreased activity tolerance, Postural dysfunction, Improper body mechanics, Dizziness  Visit Diagnosis: Cervicalgia  Cramp and spasm  Dizziness and giddiness     Problem List Patient Active Problem List   Diagnosis Date Noted  . 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 Ankle sprain 03/06/2011    Percival Spanish, PT, MPT 06/18/2017, 7:23 PM  [facility] [address]  [place], [place], [address] Phone: [phone]   Fax:  [phone]  Name: DEAH OTTAWAY MRN: [id number] Date of Birth: 1955/12/21

## 2017-06-23 ENCOUNTER — Ambulatory Visit: Payer: 59 | Admitting: Physical Therapy

## 2017-06-23 ENCOUNTER — Encounter: Payer: Self-pay | Admitting: Physical Therapy

## 2017-06-23 DIAGNOSIS — M542 Cervicalgia: Secondary | ICD-10-CM

## 2017-06-23 DIAGNOSIS — R252 Cramp and spasm: Secondary | ICD-10-CM

## 2017-06-23 DIAGNOSIS — R42 Dizziness and giddiness: Secondary | ICD-10-CM

## 2017-06-23 NOTE — Therapy (Signed)
[facility] [address]  [place], [place], [address] Phone: [phone]   Fax:  [phone]  Physical Therapy Treatment  Patient Details  Name: Chelsea Vega MRN: [id number] Date of Birth: 1956/07/03 Referring Provider: Mat Carne, PA-C   Encounter Date: 06/23/2017  PT End of Session - 06/23/17 1707    Visit Number  3    Number of Visits  12    Date for PT Re-Evaluation  07/24/17    Authorization Type  [employer]    Authorization - Number of Visits  34    PT Start Time  1707 pt arrived late    PT Stop Time  1745    PT Time Calculation (min)  38 min    Activity Tolerance  Patient tolerated treatment well;Treatment limited secondary to medical complications (Comment)       Past Medical History:  Diagnosis Date  . Abnormal vaginal Pap smear    DISTANT HX- S/P LEEP  . Allergic rhinitis   . Anemia    pregnancy  . Asthma    childhood, early 20's  . Constipation   . Depression   . Dysrhythmia    occassional PVC's  . GERD (gastroesophageal reflux disease)   . Hx of cystitis    RECURRENT, DR. TANNENBAUM  . Insomnia   . Osteoporosis 10/2008   DR. [place], [place] 10/2008  . Postmenopausal   . PVC's (premature ventricular contractions)    HX OF  . Slow heart rate   . Tinnitus   . Trapezius muscle spasm   . Vitamin D deficiency     Past Surgical History:  Procedure Laterality Date  . ANKLE SURGERY    . ANTERIOR AND POSTERIOR REPAIR N/A 05/24/2015   Procedure: ANTERIOR (CYSTOCELE) AND POSTERIOR REPAIR (RECTOCELE);  Surgeon: Dian Queen, MD;  Location: [facility];  Service: Gynecology;  Laterality: N/A;  . DIAGNOSTIC LAPAROSCOPY  2012   removed tubes and ovaries  . GANGLION CYST EXCISION    . SEPTOPLASTY    . VAGINAL HYSTERECTOMY  05/24/2015   Procedure: HYSTERECTOMY VAGINAL;  Surgeon: Dian Queen, MD;  Location: [facility];  Service: Gynecology;;  uterus and cervix only    There were no vitals filed for  this visit.  Subjective Assessment - 06/23/17 1709    Subjective  Pt reports her neck was feeling really good as of Fri & Sat, but then flared it up while trying to clean a grill pan and then clearing ice and snow off her vehicle.    Patient Stated Goals  "to get rid of the pain in my neck"    Currently in Pain?  Yes    Pain Score  2     Pain Location  Neck    Pain Orientation  Right    Pain Descriptors / Indicators  Dull;Aching    Pain Type  Acute pain    Pain Frequency  Intermittent                      [facility] Adult PT Treatment/Exercise - 06/23/17 1707      Exercises   Exercises  Neck      Neck Exercises: Machines for Strengthening   UBE (Upper Arm Bike)  lvl 2.0 fwd/back x 3' each    Cybex Row  15# x15, narrow grip    Other Machines for Strengthening  BATCA pulldown 10# x15      Neck Exercises: Theraband   Shoulder  Extension  15 reps;Red    Shoulder Extension Limitations  standing    Rows  15 reps;Red    Rows Limitations  standing    Shoulder External Rotation  10 reps;Red    Shoulder External Rotation Limitations  hooklying on pool noodle    Horizontal ABduction  10 reps;Red    Horizontal ABduction Limitations  hooklying on pool noodle    Other Theraband Exercises  al shoulder flexion/extension with red TB x10, hooklying on pool noodle      Neck Exercises: Standing   Wall Push Ups  10 reps      Neck Exercises: Supine   Other Supine Exercise  B shoulder protraction 2# x10      Neck Exercises: Prone   Shoulder Extension  10 reps    Shoulder Extension Limitations  I's over green Pball    Other Prone Exercise  B shoulder horiz ABD (T's) over green Pball x10      Neck Exercises: Stretches   Other Neck Stretches  pec stretch hooklying on pool noodle (varying arm position) x 2'             PT Education - 06/23/17 1745    Education provided  Yes    Education Details  HEP addition    Person(s) Educated  Patient    Methods   Explanation;Demonstration;Handout    Comprehension  Verbalized understanding;Returned demonstration          PT Long Term Goals - 06/16/17 1629      PT LONG TERM GOAL #1   Title  Independent with ongoing HEP    Status  On-going      PT LONG TERM GOAL #2   Title  Pt will verbalize/demonstrate understanding of neutral spine and shoulder posture to decrease cervical muscle strain    Status  On-going      PT LONG TERM GOAL #3   Title  Cerivcal ROM WFL w/o limitation due to pain or muscle tightness    Status  On-going      PT LONG TERM GOAL #4   Title  R shoulder flexion ROM equivalent to L shoudler    Status  On-going      PT LONG TERM GOAL #5   Title  Pt will report no sleep disturbance while lying on either side due to neck pain    Status  On-going      PT LONG TERM GOAL #6   Title  Pt will report ability to turn her head adequately to check her blindspot while driving w/o limitation due to neck pain or dizziness    Status  On-going            Plan - 06/23/17 1713    Clinical Impression Statement  Pt reporting significant improvement in neck pain and dizziness toward the end of last week into the weekend, flaring back up with activities related to the winter storm. Pt demonstrating good tolerance for exercises, therefore HEP updated to include supine and standing theraband exercises with red TB.    Rehab Potential  Good    Clinical Impairments Affecting Rehab Potential  dizziness of uncertain origin, osteoporosis    PT Treatment/Interventions  Patient/family education;ADLs/Self Care Home Management;Neuromuscular re-education;Therapeutic exercise;Therapeutic activities;Manual techniques;Passive range of motion;Dry needling;Taping;Electrical Stimulation;Moist Heat;Cryotherapy;Iontophoresis 4mg /ml Dexamethasone;Vestibular;Canalith Repostioning    Consulted and Agree with Plan of Care  Patient       Patient will benefit from skilled therapeutic intervention in order to  improve the following deficits and  impairments:  Pain, Decreased range of motion, Impaired flexibility, Increased muscle spasms, Impaired UE functional use, Decreased activity tolerance, Postural dysfunction, Improper body mechanics, Dizziness  Visit Diagnosis: Cervicalgia  Cramp and spasm  Dizziness and giddiness     Problem List Patient Active Problem List   Diagnosis Date Noted  . 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 Ankle sprain 03/06/2011    Percival Spanish, PT, MPT 06/23/2017, 6:02 PM  [facility] [address]  [place], [place], [address] Phone: [phone]   Fax:  [phone]  Name: Chelsea Vega MRN: [id number] Date of Birth: September 23, 1955

## 2017-06-24 ENCOUNTER — Other Ambulatory Visit (HOSPITAL_BASED_OUTPATIENT_CLINIC_OR_DEPARTMENT_OTHER): Payer: Self-pay | Admitting: Physician Assistant

## 2017-06-24 DIAGNOSIS — M542 Cervicalgia: Secondary | ICD-10-CM

## 2017-06-24 DIAGNOSIS — D2262 Melanocytic nevi of left upper limb, including shoulder: Secondary | ICD-10-CM | POA: Diagnosis not present

## 2017-06-24 DIAGNOSIS — D225 Melanocytic nevi of trunk: Secondary | ICD-10-CM | POA: Diagnosis not present

## 2017-06-24 DIAGNOSIS — D2261 Melanocytic nevi of right upper limb, including shoulder: Secondary | ICD-10-CM | POA: Diagnosis not present

## 2017-06-25 ENCOUNTER — Ambulatory Visit: Payer: 59 | Admitting: Physical Therapy

## 2017-06-25 ENCOUNTER — Encounter: Payer: Self-pay | Admitting: Physical Therapy

## 2017-06-25 DIAGNOSIS — M542 Cervicalgia: Secondary | ICD-10-CM

## 2017-06-25 DIAGNOSIS — R42 Dizziness and giddiness: Secondary | ICD-10-CM

## 2017-06-25 DIAGNOSIS — R252 Cramp and spasm: Secondary | ICD-10-CM

## 2017-06-25 NOTE — Therapy (Signed)
[facility] [address]  [place], [place], [address] Phone: [phone]   Fax:  [phone]  Physical Therapy Treatment  Patient Details  Name: Chelsea Vega MRN: [id number] Date of Birth: 1955/11/13 Referring Provider: Mat Carne, PA-C   Encounter Date: 06/25/2017  PT End of Session - 06/25/17 1622    Visit Number  5    Number of Visits  12    Date for PT Re-Evaluation  07/24/17    Authorization Type  UHC    Authorization - Number of Visits  58    PT Start Time  1622 pt arrived late    PT Stop Time  1700    PT Time Calculation (min)  38 min    Activity Tolerance  Patient tolerated treatment well;Treatment limited secondary to medical complications (Comment)       Past Medical History:  Diagnosis Date  . Abnormal vaginal Pap smear    DISTANT HX- S/P LEEP  . Allergic rhinitis   . Anemia    pregnancy  . Asthma    childhood, early 20's  . Constipation   . Depression   . Dysrhythmia    occassional PVC's  . GERD (gastroesophageal reflux disease)   . Hx of cystitis    RECURRENT, Chelsea Vega  . Insomnia   . Osteoporosis 10/2008   Chelsea Vega, [place] 10/2008  . Postmenopausal   . PVC's (premature ventricular contractions)    HX OF  . Slow heart rate   . Tinnitus   . Trapezius muscle spasm   . Vitamin D deficiency     Past Surgical History:  Procedure Laterality Date  . ANKLE SURGERY    . ANTERIOR AND POSTERIOR REPAIR N/A 05/24/2015   Procedure: ANTERIOR (CYSTOCELE) AND POSTERIOR REPAIR (RECTOCELE);  Surgeon: Chelsea Queen, MD;  Location: [facility];  Service: Gynecology;  Laterality: N/A;  . DIAGNOSTIC LAPAROSCOPY  2012   removed tubes and ovaries  . GANGLION CYST EXCISION    . SEPTOPLASTY    . VAGINAL HYSTERECTOMY  05/24/2015   Procedure: HYSTERECTOMY VAGINAL;  Surgeon: Chelsea Queen, MD;  Location: [facility];  Service: Gynecology;;  uterus and cervix only    There were no vitals filed for  this visit.  Subjective Assessment - 06/25/17 1622    Subjective  Pt reporting she has been cooped up at her desk all week due to inclement weather. Thinks her neck is loosening up and dizzy spells have become very infrequent. States she reached out to her MD and requested a cervical MRI.    Patient Stated Goals  "to get rid of the pain in my neck"    Currently in Pain?  Yes    Pain Score  -- 1-2/10    Pain Location  Neck    Pain Orientation  Right    Pain Descriptors / Indicators  Dull;Aching    Pain Type  Acute pain    Pain Frequency  Intermittent                      [facility] Adult PT Treatment/Exercise - 06/25/17 1622      Exercises   Exercises  Neck      Neck Exercises: Machines for Strengthening   UBE (Upper Arm Bike)  lvl 2.5 fwd/back x 3' each    Cybex Row  20# x15, narrow grip; 15# x15, wide grip    Cybex Chest Press  15# x15, narrow grip with  plus      Neck Exercises: Theraband   Shoulder External Rotation  10 reps;Red    Shoulder External Rotation Limitations  standing against pool noodle on wall    Horizontal ABduction  10 reps;Red    Horizontal ABduction Limitations  standing against pool noodle on wall    Other Theraband Exercises  alt shoulder flexion/extension with red TB x10, standing against pool noodle on wall      Neck Exercises: Standing   Wall Push Ups  10 reps    Wall Push Ups Limitations  on orange Pball on wall    Upper Extremity D1  Flexion;Extension;10 reps;Theraband    Theraband Level (UE D1)  Level 1 (Yellow)    Upper Extremity D2  Flexion;Extension;10 reps;Theraband    Theraband Level (UE D2)  Level 1 (Yellow)    Wall Wash  R UE overhead arc 1# x15      Neck Exercises: Prone   Shoulder Extension  10 reps    Shoulder Extension Limitations  I's over green Pball    Other Prone Exercise  B shoulder horiz ABD (T's) & shoulder horiz ABD/flexion (Y's) & scap retraction + shoulder ER (W's) over green Pball x10 each      Shoulder  Exercises: Standing   External Rotation  Right;10 reps;Theraband    Theraband Level (Shoulder External Rotation)  Level 1 (Yellow)    Internal Rotation  Right;10 reps;Theraband    Theraband Level (Shoulder Internal Rotation)  Level 1 (Yellow)                  PT Long Term Goals - 06/16/17 1629      PT LONG TERM GOAL #1   Title  Independent with ongoing HEP    Status  On-going      PT LONG TERM GOAL #2   Title  Pt will verbalize/demonstrate understanding of neutral spine and shoulder posture to decrease cervical muscle strain    Status  On-going      PT LONG TERM GOAL #3   Title  Cerivcal ROM WFL w/o limitation due to pain or muscle tightness    Status  On-going      PT LONG TERM GOAL #4   Title  R shoulder flexion ROM equivalent to L shoudler    Status  On-going      PT LONG TERM GOAL #5   Title  Pt will report no sleep disturbance while lying on either side due to neck pain    Status  On-going      PT LONG TERM GOAL #6   Title  Pt will report ability to turn her head adequately to check her blindspot while driving w/o limitation due to neck pain or dizziness    Status  On-going            Plan - 06/25/17 1629    Clinical Impression Statement  Chelsea Vega reporting continued reduction in neck pain with decreasing frequency and intensity of dizzy episodes. Able to progress exercises to inlcude more gravity resistance with progression of several theraband exercises to standing as well as progression of prone exercises over Pball.    Rehab Potential  Good    Clinical Impairments Affecting Rehab Potential  dizziness of uncertain origin, osteoporosis    PT Treatment/Interventions  Patient/family education;ADLs/Self Care Home Management;Neuromuscular re-education;Therapeutic exercise;Therapeutic activities;Manual techniques;Passive range of motion;Dry needling;Taping;Electrical Stimulation;Moist Heat;Cryotherapy;Iontophoresis 4mg /ml Dexamethasone;Vestibular;Canalith  Repostioning    Consulted and Agree with Plan of Care  Patient  Patient will benefit from skilled therapeutic intervention in order to improve the following deficits and impairments:  Pain, Decreased range of motion, Impaired flexibility, Increased muscle spasms, Impaired UE functional use, Decreased activity tolerance, Postural dysfunction, Improper body mechanics, Dizziness  Visit Diagnosis: Cervicalgia  Cramp and spasm  Dizziness and giddiness     Problem List Patient Active Problem List   Diagnosis Date Noted  . 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 Ankle sprain 03/06/2011    Percival Spanish, PT, MPT 06/25/2017, 5:34 PM  [facility] [address]  [place], [place], [address] Phone: [phone]   Fax:  [phone]  Name: Chelsea Vega MRN: [id number] Date of Birth: 09-24-1955

## 2017-06-30 ENCOUNTER — Ambulatory Visit: Payer: 59 | Admitting: Physical Therapy

## 2017-06-30 ENCOUNTER — Encounter: Payer: Self-pay | Admitting: Physical Therapy

## 2017-06-30 DIAGNOSIS — M542 Cervicalgia: Secondary | ICD-10-CM | POA: Diagnosis not present

## 2017-06-30 DIAGNOSIS — R42 Dizziness and giddiness: Secondary | ICD-10-CM

## 2017-06-30 DIAGNOSIS — R252 Cramp and spasm: Secondary | ICD-10-CM

## 2017-06-30 NOTE — Therapy (Signed)
[facility] [address]  [place], [place], [address] Phone: [phone]   Fax:  [phone]  Physical Therapy Treatment  Patient Details  Name: Chelsea Vega MRN: [id number] Date of Birth: 12-16-1955 Referring Provider: Mat Carne, PA-C   Encounter Date: 06/30/2017  PT End of Session - 06/30/17 1623    Visit Number  6    Number of Visits  12    Date for PT Re-Evaluation  07/24/17    Authorization Type  UHC    Authorization - Number of Visits  29    PT Start Time  1623 pt arrived late & then using BR    PT Stop Time  1717    PT Time Calculation (min)  54 min    Activity Tolerance  Patient tolerated treatment well;Treatment limited secondary to medical complications (Comment)       Past Medical History:  Diagnosis Date  . Abnormal vaginal Pap smear    DISTANT HX- S/P LEEP  . Allergic rhinitis   . Anemia    pregnancy  . Asthma    childhood, early 20's  . Constipation   . Depression   . Dysrhythmia    occassional PVC's  . GERD (gastroesophageal reflux disease)   . Hx of cystitis    RECURRENT, DR. TANNENBAUM  . Insomnia   . Osteoporosis 10/2008   DR. [place], [place] 10/2008  . Postmenopausal   . PVC's (premature ventricular contractions)    HX OF  . Slow heart rate   . Tinnitus   . Trapezius muscle spasm   . Vitamin D deficiency     Past Surgical History:  Procedure Laterality Date  . ANKLE SURGERY    . ANTERIOR AND POSTERIOR REPAIR N/A 05/24/2015   Procedure: ANTERIOR (CYSTOCELE) AND POSTERIOR REPAIR (RECTOCELE);  Surgeon: Dian Queen, MD;  Location: [facility];  Service: Gynecology;  Laterality: N/A;  . DIAGNOSTIC LAPAROSCOPY  2012   removed tubes and ovaries  . GANGLION CYST EXCISION    . SEPTOPLASTY    . VAGINAL HYSTERECTOMY  05/24/2015   Procedure: HYSTERECTOMY VAGINAL;  Surgeon: Dian Queen, MD;  Location: [facility];  Service: Gynecology;;  uterus and cervix only    There were no  vitals filed for this visit.  Subjective Assessment - 06/30/17 1625    Subjective  Pt reporting she has been very busy and is feeling more tension today, with increase in dizzy spells noted as well.    Patient Stated Goals  "to get rid of the pain in my neck"    Currently in Pain?  Yes    Pain Score  -- 1-2/10    Pain Location  Neck    Pain Orientation  Right    Pain Descriptors / Indicators  -- "tension"                      [facility] Adult PT Treatment/Exercise - 06/30/17 1623      Exercises   Exercises  Neck      Neck Exercises: Machines for Strengthening   UBE (Upper Arm Bike)  lvl 2.5 fwd/back x 3' each      Neck Exercises: Theraband   Shoulder Extension  15 reps;Red    Shoulder Extension Limitations  standing    Rows  15 reps;Red    Rows Limitations  standing    Other Theraband Exercises  "W" row with yellow TB 15x3"      Neck  Exercises: Standing   Upper Extremity D1  Flexion;Extension;10 reps;Theraband    Theraband Level (UE D1)  Level 1 (Yellow)    Upper Extremity D2  Flexion;Extension;10 reps;Theraband    Theraband Level (UE D2)  Level 1 (Yellow)      Neck Exercises: Supine   X to V  15 reps    X to V Weights (lbs)  1    X to V Limitations  hooklying on pool noodle     Shoulder Flexion  Both;10 reps    Shoulder Flexion Limitations  yellow TB    Other Supine Exercise  B shoulder protraction 3# x15    Other Supine Exercise  B shoulder circles at 90 dg flexion CW/CCW x10 each      Modalities   Modalities  Electrical Stimulation;Moist Heat      Moist Heat Therapy   Number Minutes Moist Heat  15 Minutes    Moist Heat Location  Cervical;Shoulder Rt      Electrical Stimulation   Electrical Stimulation Location  R UT/shoulder complex    Electrical Stimulation Action  IFC    Electrical Stimulation Parameters  80-150 Hz, intensity to pt tol x15'    Electrical Stimulation Goals  Pain;Tone      Manual Therapy   Manual Therapy  Soft tissue  mobilization;Passive ROM;Manual Traction    Soft tissue mobilization  B UT, LS, cervical paraspinals & suboccipitals, R>L    Passive ROM  gentle cervical PROM/manual stretching in all planes    Manual Traction  gentle manual distraction 5x30"                  PT Long Term Goals - 06/16/17 1629      PT LONG TERM GOAL #1   Title  Independent with ongoing HEP    Status  On-going      PT LONG TERM GOAL #2   Title  Pt will verbalize/demonstrate understanding of neutral spine and shoulder posture to decrease cervical muscle strain    Status  On-going      PT LONG TERM GOAL #3   Title  Cerivcal ROM WFL w/o limitation due to pain or muscle tightness    Status  On-going      PT LONG TERM GOAL #4   Title  R shoulder flexion ROM equivalent to L shoudler    Status  On-going      PT LONG TERM GOAL #5   Title  Pt will report no sleep disturbance while lying on either side due to neck pain    Status  On-going      PT LONG TERM GOAL #6   Title  Pt will report ability to turn her head adequately to check her blindspot while driving w/o limitation due to neck pain or dizziness    Status  On-going            Plan - 06/30/17 1628    Clinical Impression Statement  Pt reporting increased stress with upcoming holidays and noting increased tension with increased incidence of dizzy spells today, but continues to deny pain. Pt reporting she prefers hooklying exercises with pool noodle at home to standing versions of these exercises, and feels current theraband resistance levels are still challenging. Minimal progression of exercises today at pt request due to increased tension, and pt requesting to complete session with estim and moist heat to promote muscle relaxation.    Rehab Potential  Good    Clinical Impairments Affecting Rehab Potential  dizziness of uncertain origin, osteoporosis    PT Treatment/Interventions  Patient/family education;ADLs/Self Care Home Management;Neuromuscular  re-education;Therapeutic exercise;Therapeutic activities;Manual techniques;Passive range of motion;Dry needling;Taping;Electrical Stimulation;Moist Heat;Cryotherapy;Iontophoresis 4mg /ml Dexamethasone;Vestibular;Canalith Repostioning    Consulted and Agree with Plan of Care  Patient       Patient will benefit from skilled therapeutic intervention in order to improve the following deficits and impairments:  Pain, Decreased range of motion, Impaired flexibility, Increased muscle spasms, Impaired UE functional use, Decreased activity tolerance, Postural dysfunction, Improper body mechanics, Dizziness  Visit Diagnosis: Cervicalgia  Cramp and spasm  Dizziness and giddiness     Problem List Patient Active Problem List   Diagnosis Date Noted  . 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 Ankle sprain 03/06/2011    Percival Spanish, PT, MPT 06/30/2017, 5:20 PM  [facility] [address]  [place], [place], [address] Phone: [phone]   Fax:  [phone]  Name: CHIARA COLTRIN MRN: [id number] Date of Birth: 05-Sep-1955

## 2017-07-02 ENCOUNTER — Ambulatory Visit: Payer: 59 | Admitting: Physical Therapy

## 2017-07-02 ENCOUNTER — Encounter: Payer: Self-pay | Admitting: Physical Therapy

## 2017-07-02 DIAGNOSIS — M542 Cervicalgia: Secondary | ICD-10-CM | POA: Diagnosis not present

## 2017-07-02 DIAGNOSIS — R252 Cramp and spasm: Secondary | ICD-10-CM

## 2017-07-02 DIAGNOSIS — R42 Dizziness and giddiness: Secondary | ICD-10-CM

## 2017-07-02 NOTE — Therapy (Signed)
[facility] [address]  [place], [place], [address] Phone: [phone]   Fax:  [phone]  Physical Therapy Treatment  Patient Details  Name: Chelsea Vega MRN: [id number] Date of Birth: 07/17/1955 Referring Provider: Mat Carne, PA-C   Encounter Date: 07/02/2017  PT End of Session - 07/02/17 1615    Visit Number  7    Number of Visits  12    Date for PT Re-Evaluation  07/24/17    Authorization Type  UHC    Authorization - Number of Visits  60    PT Start Time  0175    PT Stop Time  1701    PT Time Calculation (min)  46 min    Activity Tolerance  Patient tolerated treatment well;Treatment limited secondary to medical complications (Comment)       Past Medical History:  Diagnosis Date  . Abnormal vaginal Pap smear    DISTANT HX- S/P LEEP  . Allergic rhinitis   . Anemia    pregnancy  . Asthma    childhood, early 20's  . Constipation   . Depression   . Dysrhythmia    occassional PVC's  . GERD (gastroesophageal reflux disease)   . Hx of cystitis    RECURRENT, DR. TANNENBAUM  . Insomnia   . Osteoporosis 10/2008   DR. Newton, [place] 10/2008  . Postmenopausal   . PVC's (premature ventricular contractions)    HX OF  . Slow heart rate   . Tinnitus   . Trapezius muscle spasm   . Vitamin D deficiency     Past Surgical History:  Procedure Laterality Date  . ANKLE SURGERY    . ANTERIOR AND POSTERIOR REPAIR N/A 05/24/2015   Procedure: ANTERIOR (CYSTOCELE) AND POSTERIOR REPAIR (RECTOCELE);  Surgeon: Dian Queen, MD;  Location: [facility];  Service: Gynecology;  Laterality: N/A;  . DIAGNOSTIC LAPAROSCOPY  2012   removed tubes and ovaries  . GANGLION CYST EXCISION    . SEPTOPLASTY    . VAGINAL HYSTERECTOMY  05/24/2015   Procedure: HYSTERECTOMY VAGINAL;  Surgeon: Dian Queen, MD;  Location: [facility];  Service: Gynecology;;  uterus and cervix only    There were no vitals filed for this  visit.  Subjective Assessment - 07/02/17 1620    Patient Stated Goals  "to get rid of the pain in my neck"    Currently in Pain?  Yes    Pain Score  1     Pain Location  Neck    Pain Orientation  Right    Pain Descriptors / Indicators  -- "tension"    Pain Type  Acute pain    Pain Frequency  Intermittent                      [facility] Adult PT Treatment/Exercise - 07/02/17 1615      Exercises   Exercises  Neck      Neck Exercises: Machines for Strengthening   UBE (Upper Arm Bike)  lvl 3.0 fwd/back x 3' each      Neck Exercises: Standing   Wall Push Ups  15 reps    Wall Push Ups Limitations  on orange Pball    Other Standing Exercises  TRX low & mid rows x15 each    Other Standing Exercises  R shoulder cabinet reaches to 1st & 2nd shelves x10 each; flexion - 2#, scaption - 1#      Neck Exercises: Prone  Shoulder Extension  10 reps;Weights    Shoulder Extension Weights (lbs)  1    Plank  elbows to toes 2x15"    Other Prone Exercise  B shoulder horiz ABD (T's) - 1# & shoulder horiz ABD/flexion (Y's) & scap retraction + shoulder ER (W's) - 1# over green Pball x10 each      Modalities   Modalities  Electrical Stimulation;Moist Heat      Moist Heat Therapy   Number Minutes Moist Heat  15 Minutes    Moist Heat Location  Cervical;Shoulder Rt      Electrical Stimulation   Electrical Stimulation Location  R UT/shoulder complex    Electrical Stimulation Action  IFC    Electrical Stimulation Parameters  80-150 Hz, intensity to pt tol x15'    Electrical Stimulation Goals  Pain;Tone                  PT Long Term Goals - 06/16/17 1629      PT LONG TERM GOAL #1   Title  Independent with ongoing HEP    Status  On-going      PT LONG TERM GOAL #2   Title  Pt will verbalize/demonstrate understanding of neutral spine and shoulder posture to decrease cervical muscle strain    Status  On-going      PT LONG TERM GOAL #3   Title  Cerivcal ROM WFL w/o  limitation due to pain or muscle tightness    Status  On-going      PT LONG TERM GOAL #4   Title  R shoulder flexion ROM equivalent to L shoudler    Status  On-going      PT LONG TERM GOAL #5   Title  Pt will report no sleep disturbance while lying on either side due to neck pain    Status  On-going      PT LONG TERM GOAL #6   Title  Pt will report ability to turn her head adequately to check her blindspot while driving w/o limitation due to neck pain or dizziness    Status  On-going            Plan - 07/02/17 1621    Clinical Impression Statement  Pt noting improvement with PT, niting only mild inceased tension recently rather than pain. Able to resume progression of exercises today with good tolerance, although pt noting fatigue with some exercises. Pt requesting to end session with estim again today to help keep her relaxed for the holidays. Pt progressing well toward goals and will benefit from continued PT for furhter postuaral training and strengthening.    Rehab Potential  Good    Clinical Impairments Affecting Rehab Potential  dizziness of uncertain origin, osteoporosis    PT Treatment/Interventions  Patient/family education;ADLs/Self Care Home Management;Neuromuscular re-education;Therapeutic exercise;Therapeutic activities;Manual techniques;Passive range of motion;Dry needling;Taping;Electrical Stimulation;Moist Heat;Cryotherapy;Iontophoresis 4mg /ml Dexamethasone;Vestibular;Canalith Repostioning    Consulted and Agree with Plan of Care  Patient       Patient will benefit from skilled therapeutic intervention in order to improve the following deficits and impairments:  Pain, Decreased range of motion, Impaired flexibility, Increased muscle spasms, Impaired UE functional use, Decreased activity tolerance, Postural dysfunction, Improper body mechanics, Dizziness  Visit Diagnosis: Cervicalgia  Cramp and spasm  Dizziness and giddiness     Problem List Patient Active  Problem List   Diagnosis Date Noted  . 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 Ankle sprain 03/06/2011    Percival Spanish, PT,  MPT 07/02/2017, 5:02 PM  [facility] [address]  [place], [place], [address] Phone: [phone]   Fax:  [phone]  Name: Chelsea Vega MRN: [id number] Date of Birth: 1956/02/20

## 2017-07-09 ENCOUNTER — Ambulatory Visit: Payer: 59 | Admitting: Physical Therapy

## 2017-07-09 ENCOUNTER — Encounter: Payer: Self-pay | Admitting: Physical Therapy

## 2017-07-09 DIAGNOSIS — M542 Cervicalgia: Secondary | ICD-10-CM | POA: Diagnosis not present

## 2017-07-09 DIAGNOSIS — R42 Dizziness and giddiness: Secondary | ICD-10-CM

## 2017-07-09 DIAGNOSIS — R252 Cramp and spasm: Secondary | ICD-10-CM

## 2017-07-09 NOTE — Therapy (Signed)
[facility] [address]  [place], [place], [address] Phone: [phone]   Fax:  [phone]  Physical Therapy Treatment  Patient Details  Name: Chelsea Vega MRN: [id number] Date of Birth: [birth year] Referring Provider: Mat Carne, PA-C   Encounter Date: 07/09/2017  PT End of Session - 07/09/17 1621    Visit Number  8    Number of Visits  12    Date for PT Re-Evaluation  07/24/17    Authorization Type  UHC    Authorization - Number of Visits  60    PT Start Time  1618    PT Stop Time  1713    PT Time Calculation (min)  55 min    Activity Tolerance  Patient tolerated treatment well    Behavior During Therapy  [facility] for tasks assessed/performed       Past Medical History:  Diagnosis Date  . Abnormal vaginal Pap smear    DISTANT HX- S/P LEEP  . Allergic rhinitis   . Anemia    pregnancy  . Asthma    childhood, early 20's  . Constipation   . Depression   . Dysrhythmia    occassional PVC's  . GERD (gastroesophageal reflux disease)   . Hx of cystitis    RECURRENT, DR. TANNENBAUM  . Insomnia   . Osteoporosis 10/2008   DR. Brethren, [place] 10/2008  . Postmenopausal   . PVC's (premature ventricular contractions)    HX OF  . Slow heart rate   . Tinnitus   . Trapezius muscle spasm   . Vitamin D deficiency     Past Surgical History:  Procedure Laterality Date  . ANKLE SURGERY    . ANTERIOR AND POSTERIOR REPAIR N/A 05/24/2015   Procedure: ANTERIOR (CYSTOCELE) AND POSTERIOR REPAIR (RECTOCELE);  Surgeon: Dian Queen, MD;  Location: [facility];  Service: Gynecology;  Laterality: N/A;  . DIAGNOSTIC LAPAROSCOPY  2012   removed tubes and ovaries  . GANGLION CYST EXCISION    . SEPTOPLASTY    . VAGINAL HYSTERECTOMY  05/24/2015   Procedure: HYSTERECTOMY VAGINAL;  Surgeon: Dian Queen, MD;  Location: [facility];  Service: Gynecology;;  uterus and cervix only    There were no vitals filed for this  visit.  Subjective Assessment - 07/09/17 1620    Subjective  feeling well - mid and lower back are most bothersome right now    Patient Stated Goals  "to get rid of the pain in my neck"    Currently in Pain?  Yes    Pain Score  1     Pain Location  Neck    Pain Orientation  Right    Pain Descriptors / Indicators  Tightness    Pain Type  Acute pain                      [facility] Adult PT Treatment/Exercise - 07/09/17 1622      Neck Exercises: Machines for Strengthening   UBE (Upper Arm Bike)  lvl 3.0 fwd/back x 3' each      Neck Exercises: Theraband   Shoulder External Rotation  15 reps;Red    Shoulder External Rotation Limitations  hooklying on pool noodle    Horizontal ABduction  15 reps;Red    Horizontal ABduction Limitations  hooklying on pool noodle - VC for form    Other Theraband Exercises  diagonal flexion - hooklying on pool noodle - red tband x 15 reps  each      Neck Exercises: Standing   Upper Extremity D1  Flexion;Extension;10 reps;Theraband    Theraband Level (UE D1)  Level 2 (Red)    Upper Extremity D2  Flexion;Extension;10 reps;Theraband    Theraband Level (UE D2)  Level 2 (Red)      Neck Exercises: Supine   Other Supine Exercise  single knee to chest - B 2 x 20 seconds      Neck Exercises: Sidelying   Other Sidelying Exercise  open book - 10 x 10 sec each side      Modalities   Modalities  Electrical Stimulation;Moist Heat      Moist Heat Therapy   Number Minutes Moist Heat  15 Minutes    Moist Heat Location  Cervical;Shoulder      Electrical Stimulation   Electrical Stimulation Location  R UT/shoulder complex    Electrical Stimulation Action  IFC    Electrical Stimulation Parameters  to tolerance    Electrical Stimulation Goals  Pain;Tone                  PT Long Term Goals - 06/16/17 1629      PT LONG TERM GOAL #1   Title  Independent with ongoing HEP    Status  On-going      PT LONG TERM GOAL #2   Title  Pt will  verbalize/demonstrate understanding of neutral spine and shoulder posture to decrease cervical muscle strain    Status  On-going      PT LONG TERM GOAL #3   Title  Cerivcal ROM WFL w/o limitation due to pain or muscle tightness    Status  On-going      PT LONG TERM GOAL #4   Title  R shoulder flexion ROM equivalent to L shoudler    Status  On-going      PT LONG TERM GOAL #5   Title  Pt will report no sleep disturbance while lying on either side due to neck pain    Status  On-going      PT LONG TERM GOAL #6   Title  Pt will report ability to turn her head adequately to check her blindspot while driving w/o limitation due to neck pain or dizziness    Status  On-going            Plan - 07/09/17 1621    Clinical Impression Statement  Chelsea Vega today with primary complaints of mid and low back pain of which she attributes to heavy lifting over the weekend. Did incorporate a few stretches into todays session to help relieve this pain with good tolerance. Otherwise, progressing all strengthening well with no issue. Will continue to progress towards goals.     PT Treatment/Interventions  Patient/family education;ADLs/Self Care Home Management;Neuromuscular re-education;Therapeutic exercise;Therapeutic activities;Manual techniques;Passive range of motion;Dry needling;Taping;Electrical Stimulation;Moist Heat;Cryotherapy;Iontophoresis 4mg /ml Dexamethasone;Vestibular;Canalith Repostioning    Consulted and Agree with Plan of Care  Patient       Patient will benefit from skilled therapeutic intervention in order to improve the following deficits and impairments:  Pain, Decreased range of motion, Impaired flexibility, Increased muscle spasms, Impaired UE functional use, Decreased activity tolerance, Postural dysfunction, Improper body mechanics, Dizziness  Visit Diagnosis: Cervicalgia  Cramp and spasm  Dizziness and giddiness     Problem List Patient Active Problem List   Diagnosis Date  Noted  . 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 Ankle sprain 03/06/2011     Lanney Gins, PT,  DPT 07/09/17 5:14 PM   [facility] [address]  [place], [place], [address] Phone: [phone]   Fax:  [phone]  Name: Chelsea Vega MRN: [id number] Date of Birth: 1955/09/20

## 2017-07-11 ENCOUNTER — Ambulatory Visit (HOSPITAL_BASED_OUTPATIENT_CLINIC_OR_DEPARTMENT_OTHER)
Admission: RE | Admit: 2017-07-11 | Discharge: 2017-07-11 | Disposition: A | Discharge status: Home / Self Care | Payer: 59 | Source: Ambulatory Visit | Attending: Physician Assistant | Admitting: Physician Assistant

## 2017-07-11 DIAGNOSIS — M542 Cervicalgia: Secondary | ICD-10-CM | POA: Diagnosis present

## 2017-07-16 ENCOUNTER — Ambulatory Visit: Payer: 59 | Admitting: Physical Therapy

## 2017-07-20 ENCOUNTER — Encounter: Payer: Self-pay | Admitting: Physical Therapy

## 2017-07-20 ENCOUNTER — Ambulatory Visit: Payer: 59 | Attending: Physician Assistant | Admitting: Physical Therapy

## 2017-07-20 DIAGNOSIS — R252 Cramp and spasm: Secondary | ICD-10-CM | POA: Insufficient documentation

## 2017-07-20 DIAGNOSIS — M542 Cervicalgia: Secondary | ICD-10-CM | POA: Insufficient documentation

## 2017-07-20 DIAGNOSIS — R42 Dizziness and giddiness: Secondary | ICD-10-CM | POA: Insufficient documentation

## 2017-07-20 NOTE — Therapy (Signed)
[facility] [address]  [place], [place], [address] Phone: [phone]   Fax:  [phone]  Physical Therapy Treatment  Patient Details  Name: Chelsea Vega MRN: [id number] Date of Birth: 1955/10/18 Referring Provider: Mat Carne, PA-C   Encounter Date: 07/20/2017  PT End of Session - 07/20/17 1618    Visit Number  9    Number of Visits  12    Date for PT Re-Evaluation  07/24/17    Authorization Type  UHC    Authorization - Number of Visits  60    PT Start Time  1618    PT Stop Time  1718    PT Time Calculation (min)  60 min    Activity Tolerance  Patient tolerated treatment well    Behavior During Therapy  [facility] for tasks assessed/performed       Past Medical History:  Diagnosis Date  . Abnormal vaginal Pap smear    DISTANT HX- S/P LEEP  . Allergic rhinitis   . Anemia    pregnancy  . Asthma    childhood, early 20's  . Constipation   . Depression   . Dysrhythmia    occassional PVC's  . GERD (gastroesophageal reflux disease)   . Hx of cystitis    RECURRENT, DR. TANNENBAUM  . Insomnia   . Osteoporosis 10/2008   DR. [place], [place] 10/2008  . Postmenopausal   . PVC's (premature ventricular contractions)    HX OF  . Slow heart rate   . Tinnitus   . Trapezius muscle spasm   . Vitamin D deficiency     Past Surgical History:  Procedure Laterality Date  . ANKLE SURGERY    . ANTERIOR AND POSTERIOR REPAIR N/A 05/24/2015   Procedure: ANTERIOR (CYSTOCELE) AND POSTERIOR REPAIR (RECTOCELE);  Surgeon: Dian Queen, MD;  Location: [facility];  Service: Gynecology;  Laterality: N/A;  . DIAGNOSTIC LAPAROSCOPY  2012   removed tubes and ovaries  . GANGLION CYST EXCISION    . SEPTOPLASTY    . VAGINAL HYSTERECTOMY  05/24/2015   Procedure: HYSTERECTOMY VAGINAL;  Surgeon: Dian Queen, MD;  Location: [facility];  Service: Gynecology;;  uterus and cervix only    There were no vitals filed for this  visit.  Subjective Assessment - 07/20/17 1622    Subjective  Pt feels neck is "doing pretty good" - mild tension at this point but no pain and no dizzy spells for the past several days.    Patient Stated Goals  "to get rid of the pain in my neck"    Currently in Pain?  No/denies    Pain Score  0-No pain                      [facility] Adult PT Treatment/Exercise - 07/20/17 1618      Exercises   Exercises  Neck      Neck Exercises: Machines for Strengthening   UBE (Upper Arm Bike)  lvl 3.0 fwd/back x 3' each      Neck Exercises: Theraband   Rows  15 reps;Red    Rows Limitations  "W" row in standing      Neck Exercises: Standing   Upper Extremity D1  Flexion;Extension;15 reps;Theraband    Theraband Level (UE D1)  Level 2 (Red)    Upper Extremity D2  Flexion;Extension;15 reps;Theraband    Theraband Level (UE D2)  Level 2 (Red)    Other Standing Exercises  TRX low & mid rows x15 each    Other Standing Exercises  R shoulder cabinet reaches to 1st & 2nd shelves x10 each; flexion - 3#, scaption - 2#      Shoulder Exercises: Standing   External Rotation  Right;10 reps;Theraband;Strengthening    Theraband Level (Shoulder External Rotation)  Level 2 (Red)    External Rotation Limitations  standing with shoulder at ~90 dg ABD    Internal Rotation  Right;10 reps;Theraband;Strengthening    Theraband Level (Shoulder Internal Rotation)  Level 2 (Red)    Internal Rotation Limitations  standing with shoulder at ~90 dg ABD      Shoulder Exercises: Body Blade   Flexion  15 seconds;3 reps    Flexion Limitations  90 dg    ABduction  15 seconds;3 reps    ABduction Limitations  90 dg      Modalities   Modalities  Electrical Stimulation;Moist Heat      Moist Heat Therapy   Number Minutes Moist Heat  15 Minutes    Moist Heat Location  Cervical;Shoulder      Electrical Stimulation   Electrical Stimulation Location  R UT/shoulder complex    Electrical Stimulation Action  IFC     Electrical Stimulation Parameters  80-150 Hz, intensity to pt tol x15'    Electrical Stimulation Goals  Pain;Tone                  PT Long Term Goals - 06/16/17 1629      PT LONG TERM GOAL #1   Title  Independent with ongoing HEP    Status  On-going      PT LONG TERM GOAL #2   Title  Pt will verbalize/demonstrate understanding of neutral spine and shoulder posture to decrease cervical muscle strain    Status  On-going      PT LONG TERM GOAL #3   Title  Cerivcal ROM WFL w/o limitation due to pain or muscle tightness    Status  On-going      PT LONG TERM GOAL #4   Title  R shoulder flexion ROM equivalent to L shoudler    Status  On-going      PT LONG TERM GOAL #5   Title  Pt will report no sleep disturbance while lying on either side due to neck pain    Status  On-going      PT LONG TERM GOAL #6   Title  Pt will report ability to turn her head adequately to check her blindspot while driving w/o limitation due to neck pain or dizziness    Status  On-going            Plan - 07/20/17 1623    Clinical Impression Statement  Chelsea Vega reporting continued improvement with more just "tension" than pain at present and no recent dizzy spells. Pt continues to note some weakness with overhead activties, therefore focused on strengthening esp in overhead planes. Pt pleased with progress this far and feeling like she should be able to continue on her own with HEP, therefore will plan for HEP review/update on next visit with anticipated 30 day hold.    PT Treatment/Interventions  Patient/family education;ADLs/Self Care Home Management;Neuromuscular re-education;Therapeutic exercise;Therapeutic activities;Manual techniques;Passive range of motion;Dry needling;Taping;Electrical Stimulation;Moist Heat;Cryotherapy;Iontophoresis 4mg /ml Dexamethasone;Vestibular;Canalith Repostioning    Consulted and Agree with Plan of Care  Patient       Patient will benefit from skilled therapeutic  intervention in order to improve the following deficits and impairments:  Pain, Decreased range of motion, Impaired flexibility, Increased muscle spasms, Impaired UE functional use, Decreased activity tolerance, Postural dysfunction, Improper body mechanics, Dizziness  Visit Diagnosis: Cervicalgia  Cramp and spasm  Dizziness and giddiness     Problem List Patient Active Problem List   Diagnosis Date Noted  . 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 Ankle sprain 03/06/2011    Percival Spanish, PT, MPT 07/20/2017, 5:22 PM  [facility] [address]  [place], [place], [address] Phone: [phone]   Fax:  [phone]  Name: Chelsea Vega MRN: [id number] Date of Birth: 26-Mar-1956

## 2017-07-22 ENCOUNTER — Ambulatory Visit: Payer: 59

## 2017-07-22 DIAGNOSIS — M542 Cervicalgia: Secondary | ICD-10-CM

## 2017-07-22 DIAGNOSIS — R252 Cramp and spasm: Secondary | ICD-10-CM

## 2017-07-22 DIAGNOSIS — R42 Dizziness and giddiness: Secondary | ICD-10-CM

## 2017-07-22 NOTE — Therapy (Addendum)
[facility] [address]  [place], [place], [address] Phone: [phone]   Fax:  [phone]  Physical Therapy Treatment  Patient Details  Name: JESSEL GETTINGER MRN: [id number] Date of Birth: [birth year] Referring Provider: Mat Carne, PA-C   Encounter Date: 07/22/2017  PT End of Session - 07/22/17 1638    Visit Number  10    Number of Visits  12    Date for PT Re-Evaluation  07/24/17    Authorization Type  [employer]    Authorization - Number of Visits  60    PT Start Time  1620    PT Stop Time  1700    PT Time Calculation (min)  40 min    Activity Tolerance  Patient tolerated treatment well    Behavior During Therapy  [facility] for tasks assessed/performed       Past Medical History:  Diagnosis Date  . Abnormal vaginal Pap smear    DISTANT HX- S/P LEEP  . Allergic rhinitis   . Anemia    pregnancy  . Asthma    childhood, early 20's  . Constipation   . Depression   . Dysrhythmia    occassional PVC's  . GERD (gastroesophageal reflux disease)   . Hx of cystitis    RECURRENT, DR. TANNENBAUM  . Insomnia   . Osteoporosis 10/2008   DR. [place], [place] 10/2008  . Postmenopausal   . PVC's (premature ventricular contractions)    HX OF  . Slow heart rate   . Tinnitus   . Trapezius muscle spasm   . Vitamin D deficiency     Past Surgical History:  Procedure Laterality Date  . ANKLE SURGERY    . ANTERIOR AND POSTERIOR REPAIR N/A 05/24/2015   Procedure: ANTERIOR (CYSTOCELE) AND POSTERIOR REPAIR (RECTOCELE);  Surgeon: Dian Queen, MD;  Location: [facility];  Service: Gynecology;  Laterality: N/A;  . DIAGNOSTIC LAPAROSCOPY  2012   removed tubes and ovaries  . GANGLION CYST EXCISION    . SEPTOPLASTY    . VAGINAL HYSTERECTOMY  05/24/2015   Procedure: HYSTERECTOMY VAGINAL;  Surgeon: Dian Queen, MD;  Location: [facility];  Service: Gynecology;;  uterus and cervix only    There were no vitals filed for this  visit.  Subjective Assessment - 07/22/17 1704    Subjective  Masiyah reporting she feels comfortable transitioning to HEP and wishes to make today her last therapy session.      Patient Stated Goals  "to get rid of the pain in my neck"    Currently in Pain?  No/denies    Pain Score  0-No pain    Multiple Pain Sites  No         [facility] PT Assessment - 07/22/17 1642      Observation/Other Assessments   Focus on Therapeutic Outcomes (FOTO)   71% (29% limitation)      AROM   AROM Assessment Site  Cervical;Shoulder    Right/Left Shoulder  Right;Left    Right Shoulder Flexion  140 Degrees    Left Shoulder Flexion  141 Degrees    Cervical Flexion  52    Cervical Extension  65    Cervical - Right Side Bend  39    Cervical - Left Side Bend  37    Cervical - Right Rotation  65 tightness at end ROM     Cervical - Left Rotation  80 tightness at end ROM  [facility] PT Treatment/Exercise - 07/22/17 1707      Self-Care   Self-Care  Other Self-Care Comments    Posture  Reviewed neutral head and shoulder posture to avoid excess strain on neck musculature    Other Self-Care Comments   Reviewed comprehensive HEP/gym program to check for appropriateness of band resistance and pt. understanding       Neck Exercises: Machines for Strengthening   UBE (Upper Arm Bike)  lvl 3.0 fwd/back x 3' each    Cybex Row  20# x15, narrow grip  reviewed proper technique for possible performance at gym    Other Machines for Strengthening  BATCA pulldown 10# x15 reviewed proper technique for performance at gym       Neck Exercises: Theraband   Shoulder Extension  15 reps;Red    Shoulder Extension Limitations  standing    Rows  10 reps    Rows Limitations  green TB       Shoulder Exercises: Standing   Extension  --    Row  --    Theraband Level (Shoulder Row)  --      Neck Exercises: Stretches   Upper Trapezius Stretch  30 seconds;2 reps    Levator Stretch  30 seconds;2 reps                   PT Long Term Goals - 07/22/17 1638      PT LONG TERM GOAL #1   Title  Independent with ongoing HEP    Status  Achieved      PT LONG TERM GOAL #2   Title  Pt will verbalize/demonstrate understanding of neutral spine and shoulder posture to decrease cervical muscle strain    Status  Achieved      PT LONG TERM GOAL #3   Title  Cerivcal ROM WFL w/o limitation due to pain or muscle tightness    Status  Partially Met      PT LONG TERM GOAL #4   Title  R shoulder flexion ROM equivalent to L shoudler    Status  Achieved      PT LONG TERM GOAL #5   Title  Pt will report no sleep disturbance while lying on either side due to neck pain    Status  Achieved      PT LONG TERM GOAL #6   Title  Pt will report ability to turn her head adequately to check her blindspot while driving w/o limitation due to neck pain or dizziness    Status  Achieved            Plan - 07/22/17 1704    Clinical Impression Statement  Cortnee doing well today and reports she feels comfortable going on 30-day hold from therapy and transitioning to HEP.  Tristian able to demo cervical ROM WFL with exception of slight limitation in B lateral flexion however without pain limiting and reports she is now able to drive and check blind spot without dizziness or pain.  B shoulder flexion ROM now symmetrical without pain limiting.  Able to demo and verbalize good understanding of neutral spine and cervical posture today.  Reports no longer has sleep disturbances due to neck pain or stiffness.  Loretto able to achieve or partially achieve all therapy LTG's.  Verbalized understanding of comprehensive HEP which was issued to pt. via handout with appropriate band resistance clearly listed on handout.  Previous discussion with supervising PT regarding possibility of 30-day hold with PT agreeing  to this.  Ariane now on 30-day hold from therapy.      Clinical Impairments Affecting Rehab Potential  dizziness of uncertain origin,  osteoporosis    PT Treatment/Interventions  Patient/family education;ADLs/Self Care Home Management;Neuromuscular re-education;Therapeutic exercise;Therapeutic activities;Manual techniques;Passive range of motion;Dry needling;Taping;Electrical Stimulation;Moist Heat;Cryotherapy;Iontophoresis 54m/ml Dexamethasone;Vestibular;Canalith Repostioning    PT Next Visit Plan  30-day hold    Consulted and Agree with Plan of Care  Patient       Patient will benefit from skilled therapeutic intervention in order to improve the following deficits and impairments:  Pain, Decreased range of motion, Impaired flexibility, Increased muscle spasms, Impaired UE functional use, Decreased activity tolerance, Postural dysfunction, Improper body mechanics, Dizziness  Visit Diagnosis: Cervicalgia  Cramp and spasm  Dizziness and giddiness     Problem List Patient Active Problem List   Diagnosis Date Noted  . 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 Ankle sprain 03/06/2011    MBess Harvest PTA 07/22/17 5:22 PM  [facility] [address] [place] [address]Phone: [phone]  Fax:  [phone] Name: SAMERIAH LINTMRN: [id number] of Birth: 5September 09, 1957 PHYSICAL THERAPY DISCHARGE SUMMARY  Visits from Start of Care: 10  Current functional level related to goals / functional outcomes:   Refer to above clinical impression for status as of last visit on 07/22/17. Pt was placed on hold for 30 days and has not needed to return to PT, therefore will proceed with discharge from PT for this episode.   Remaining deficits:   As above.   Education / Equipment:   HEP  Plan: Patient agrees to discharge.  Patient goals were met. Patient is being discharged due to being pleased with the current functional level.  ?????    JPercival Spanish PT, MPT 08/24/17, 2:27 PM  [facility] [place] [address]Phone: [phone]  Fax:  [phone]

## 2017-08-11 ENCOUNTER — Ambulatory Visit: Payer: 59 | Admitting: Sports Medicine

## 2017-08-11 ENCOUNTER — Encounter: Payer: Self-pay | Admitting: Sports Medicine

## 2017-08-11 VITALS — BP 113/78 | Ht 65.0 in | Wt 128.0 lb

## 2017-08-11 DIAGNOSIS — M19072 Primary osteoarthritis, left ankle and foot: Secondary | ICD-10-CM | POA: Diagnosis not present

## 2017-08-11 NOTE — Patient Instructions (Signed)
[facility] Dr Noemi Chapel Thursday 08/13/17 at 3pm [address] [place] [address] [phone]

## 2017-08-12 NOTE — Progress Notes (Signed)
   Subjective:    Patient ID: Chelsea Vega, female    DOB: 07/14/1956, 62 y.o.   MRN: [id number]  HPIchief complaint: Left ankle pain  Patient comes in today complaining of left ankle pain. This is a chronic problem for her. An MRI done of the left ankle in 2017 showed some arthritis as well as an intraosseous ganglion cyst. Her main complaint is intermittent pain with activity, especially with walking or running. She describes a sharp pain in the ankle which can be quite severe .At other times her pain is much less. She has not noticed any swelling. No recent trauma. No numbness or tingling. She has custom orthotics which have been somewhat helpful but not curative. She had similar symptoms in the right ankle several years ago and Dr. Noemi Chapel was able to operate and correct her problem at that time. Her current symptoms are similar in nature to what she experienced with her right ankle.  Interim medical history reviewed Medications reviewed Allergies reviewed    Review of Systems As above    Objective:   Physical Exam  Well-developed, well-nourished. No acute distress. Awake alert and oriented 3. Vital signs reviewed  Left ankle: Good range of motion. No soft tissue swelling. No effusion. No significant tenderness to palpation. Good ankle stability. Neurovascularly intact distally. Walking without a limp.      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 Noemi Chapel to discuss treatment options for her left ankle. Options could include cortisone injection, repeat imaging, or possibly ankle arthroscopy.I'll defer further workup and treatment to the discretion of Dr. Noemi Chapel. In the meantime, patient will continue with her custom orthotics and I have recommended that she resume wearing her ankle compression sleeve when active.

## 2017-08-13 DIAGNOSIS — M25572 Pain in left ankle and joints of left foot: Secondary | ICD-10-CM | POA: Diagnosis not present

## 2017-09-15 ENCOUNTER — Ambulatory Visit: Payer: 59 | Admitting: Cardiovascular Disease

## 2018-02-02 DIAGNOSIS — Z0000 Encounter for general adult medical examination without abnormal findings: Secondary | ICD-10-CM | POA: Diagnosis not present

## 2018-02-03 DIAGNOSIS — K449 Diaphragmatic hernia without obstruction or gangrene: Secondary | ICD-10-CM | POA: Diagnosis not present

## 2018-02-03 DIAGNOSIS — Z8371 Family history of colonic polyps: Secondary | ICD-10-CM | POA: Diagnosis not present

## 2018-02-03 DIAGNOSIS — Z1211 Encounter for screening for malignant neoplasm of colon: Secondary | ICD-10-CM | POA: Diagnosis not present

## 2018-02-03 DIAGNOSIS — K219 Gastro-esophageal reflux disease without esophagitis: Secondary | ICD-10-CM | POA: Diagnosis not present

## 2018-02-09 DIAGNOSIS — M810 Age-related osteoporosis without current pathological fracture: Secondary | ICD-10-CM | POA: Diagnosis not present

## 2018-02-09 DIAGNOSIS — Z0000 Encounter for general adult medical examination without abnormal findings: Secondary | ICD-10-CM | POA: Diagnosis not present

## 2018-02-09 DIAGNOSIS — Z13220 Encounter for screening for lipoid disorders: Secondary | ICD-10-CM | POA: Diagnosis not present

## 2018-03-31 DIAGNOSIS — Z01419 Encounter for gynecological examination (general) (routine) without abnormal findings: Secondary | ICD-10-CM | POA: Diagnosis not present

## 2018-03-31 DIAGNOSIS — Z86010 Personal history of colonic polyps: Secondary | ICD-10-CM | POA: Diagnosis not present

## 2018-03-31 DIAGNOSIS — Z6821 Body mass index (BMI) 21.0-21.9, adult: Secondary | ICD-10-CM | POA: Diagnosis not present

## 2018-03-31 DIAGNOSIS — Z8041 Family history of malignant neoplasm of ovary: Secondary | ICD-10-CM | POA: Diagnosis not present

## 2018-03-31 DIAGNOSIS — Z8042 Family history of malignant neoplasm of prostate: Secondary | ICD-10-CM | POA: Diagnosis not present

## 2018-05-11 DIAGNOSIS — Z809 Family history of malignant neoplasm, unspecified: Secondary | ICD-10-CM | POA: Diagnosis not present

## 2018-05-28 DIAGNOSIS — R300 Dysuria: Secondary | ICD-10-CM | POA: Diagnosis not present

## 2018-08-12 DIAGNOSIS — D225 Melanocytic nevi of trunk: Secondary | ICD-10-CM | POA: Diagnosis not present

## 2018-08-12 DIAGNOSIS — D2262 Melanocytic nevi of left upper limb, including shoulder: Secondary | ICD-10-CM | POA: Diagnosis not present

## 2018-08-12 DIAGNOSIS — D2261 Melanocytic nevi of right upper limb, including shoulder: Secondary | ICD-10-CM | POA: Diagnosis not present

## 2018-08-17 ENCOUNTER — Other Ambulatory Visit: Payer: Self-pay | Admitting: Radiology

## 2018-08-17 DIAGNOSIS — N6320 Unspecified lump in the left breast, unspecified quadrant: Secondary | ICD-10-CM | POA: Diagnosis not present

## 2018-08-19 ENCOUNTER — Ambulatory Visit
Admission: RE | Admit: 2018-08-19 | Discharge: 2018-08-19 | Disposition: A | Discharge status: Home / Self Care | Payer: 59 | Source: Ambulatory Visit | Attending: Radiology | Admitting: Radiology

## 2018-08-19 DIAGNOSIS — N6320 Unspecified lump in the left breast, unspecified quadrant: Secondary | ICD-10-CM

## 2018-08-19 DIAGNOSIS — R922 Inconclusive mammogram: Secondary | ICD-10-CM | POA: Diagnosis not present

## 2018-08-19 DIAGNOSIS — N644 Mastodynia: Secondary | ICD-10-CM | POA: Diagnosis not present

## 2018-08-20 ENCOUNTER — Other Ambulatory Visit: Payer: 59

## 2018-08-23 ENCOUNTER — Other Ambulatory Visit: Payer: 59

## 2019-02-05 IMAGING — MR MR CERVICAL SPINE W/O CM
5 series · 31 of 48 positions shown · non-contrast
Comparison: None.

CLINICAL DATA: Right neck pain

EXAM:
MRI CERVICAL SPINE WITHOUT CONTRAST
TECHNIQUE: Multiplanar, multisequence MR imaging of the cervical spine was
performed. No intravenous contrast was administered.

[Series 2: T2 · sagittal · 3.0mm · 0.55mm/px · 5 of 13 slices shown (1 of 3)]
[im 1/13]
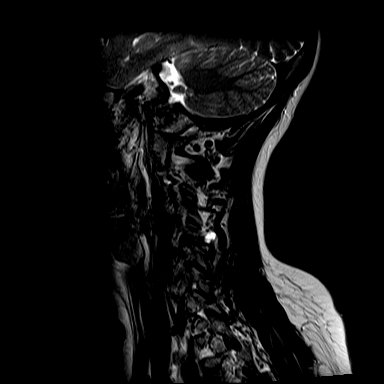
[im 4/13]
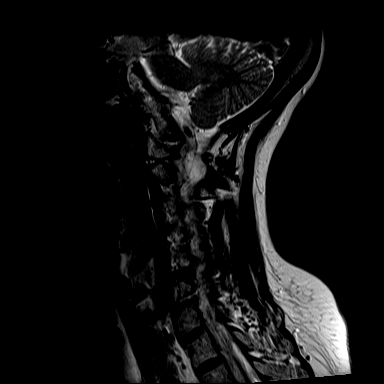
[im 7/13]
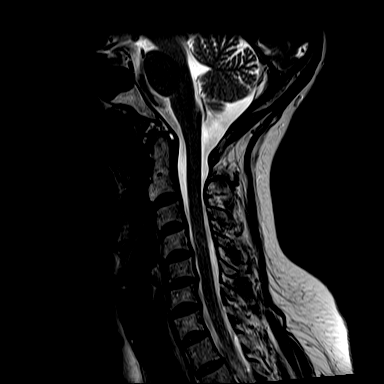
[im 10/13]
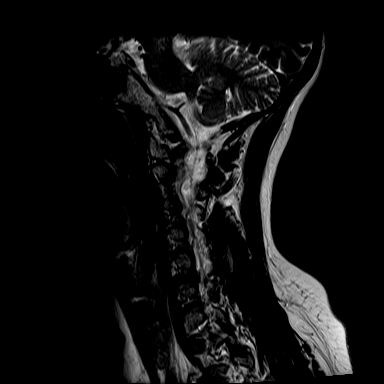
[im 13/13]
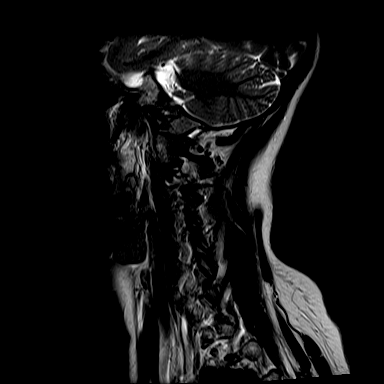

[Series 3: T1 · sagittal · 3.0mm · 0.66mm/px · 5 of 13 slices shown]
[im 1/13]
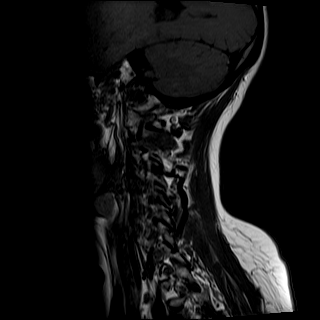
[im 4/13]
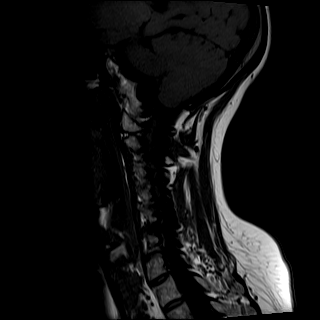
[im 7/13]
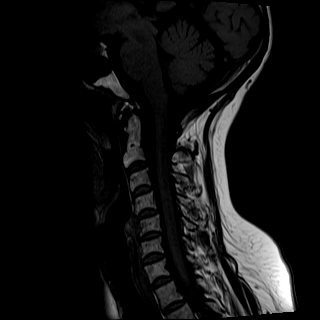
[im 10/13]
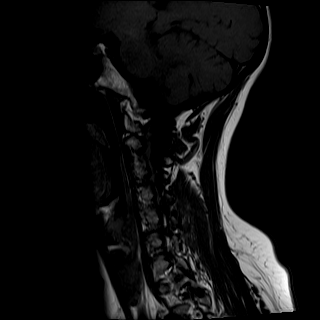
[im 13/13]
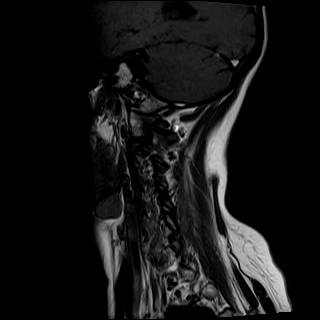

[Series 4: STIR · sagittal · 3.0mm · 0.66mm/px · 1 of 13 slices shown]
[im 1/13]
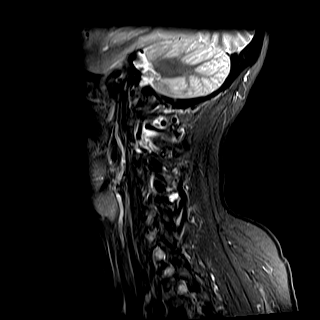

[Series 5: T2 · axial · 3.0mm · 0.39mm/px · z∈[-51,+63]mm · 10 of 36 slices shown (2 of 3)]
[im 3/36]
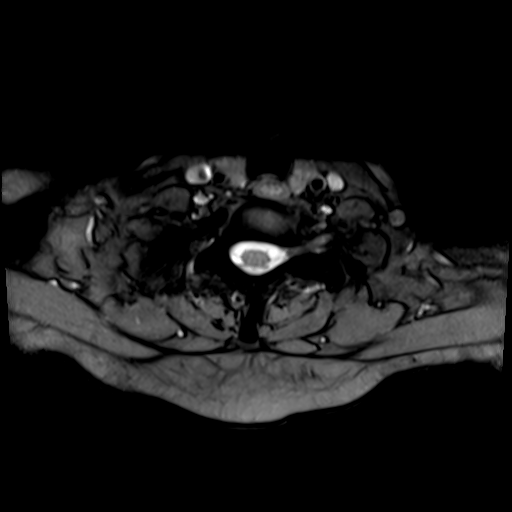
[im 5/36]
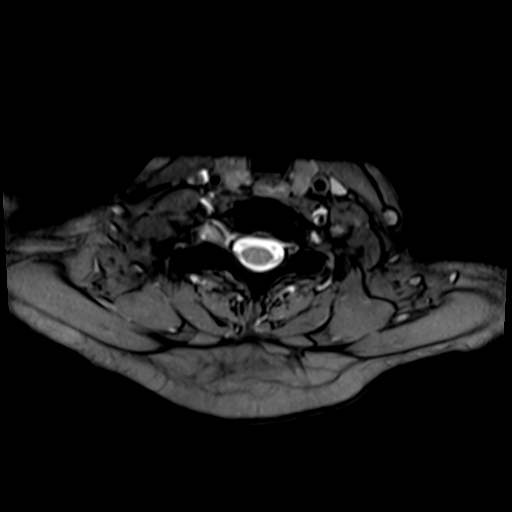
[im 8/36]
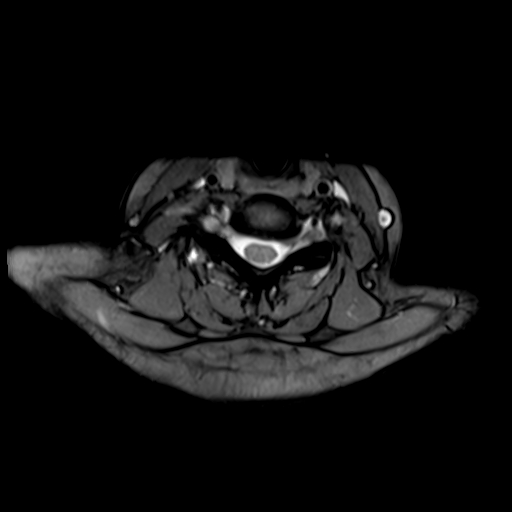
[im 12/36]
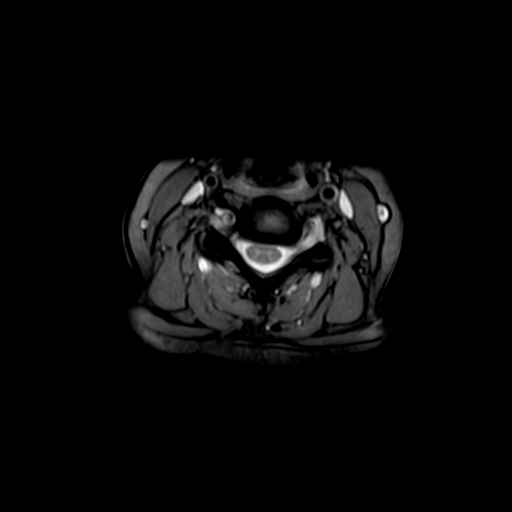
[im 17/36]
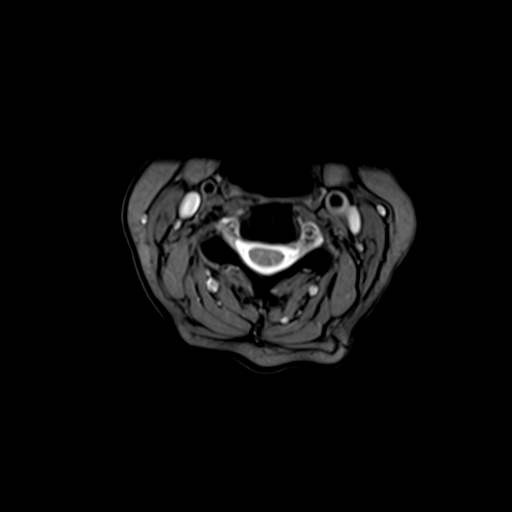
[im 19/36]
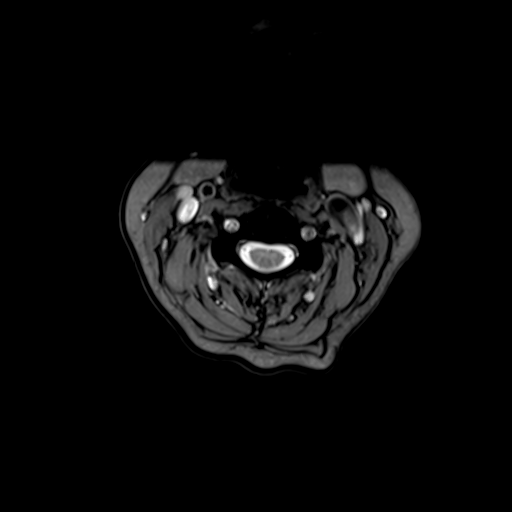
[im 22/36]
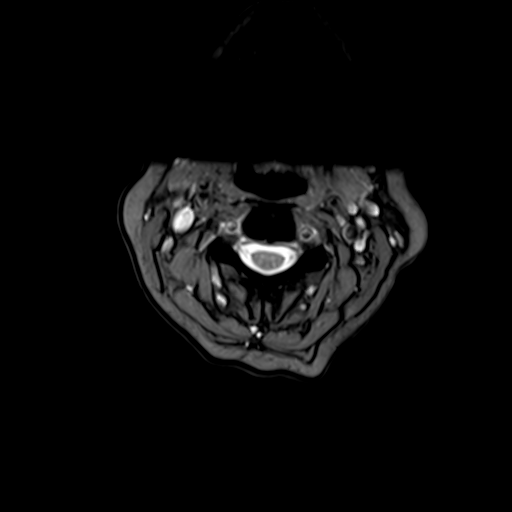
[im 26/36]
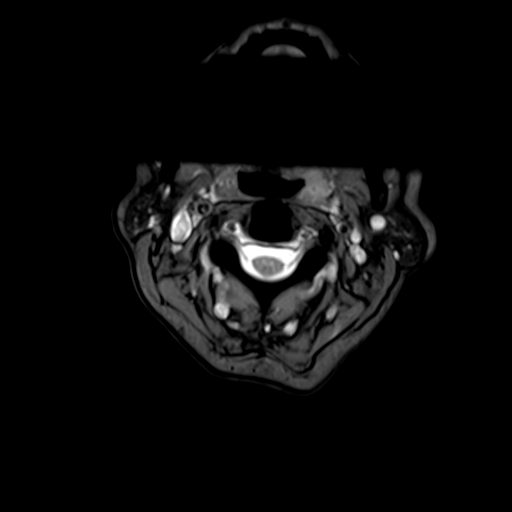
[im 31/36]
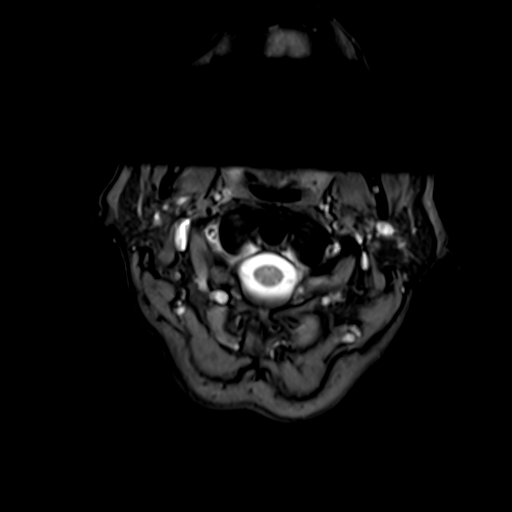
[im 36/36]
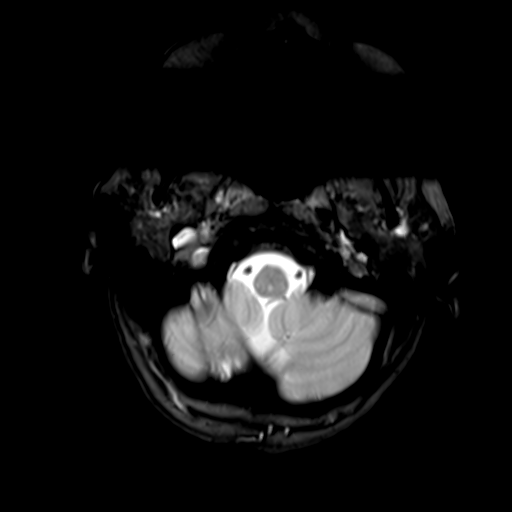

[Series 6: T2 · axial · 3.0mm · 0.62mm/px · z∈[-51,+63]mm · 10 of 36 slices shown (3 of 3)]
[im 3/36]
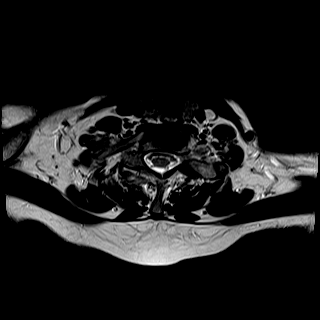
[im 5/36]
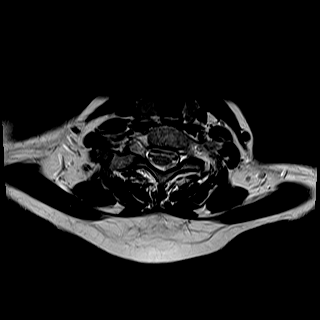
[im 8/36]
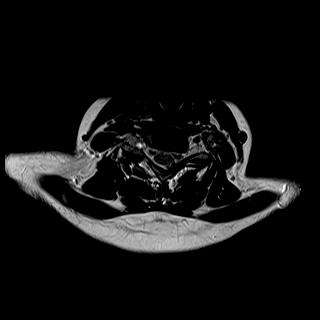
[im 12/36]
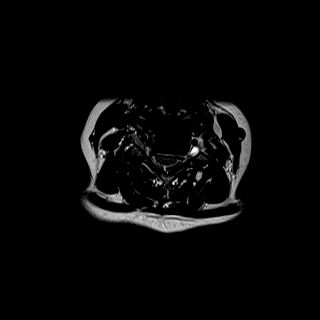
[im 17/36]
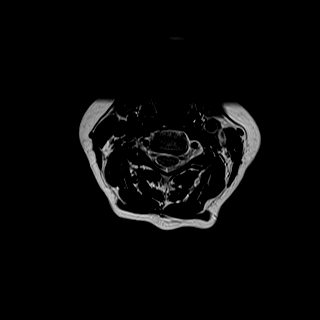
[im 19/36]
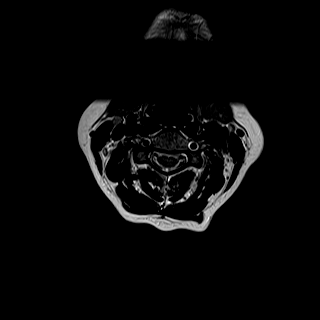
[im 22/36]
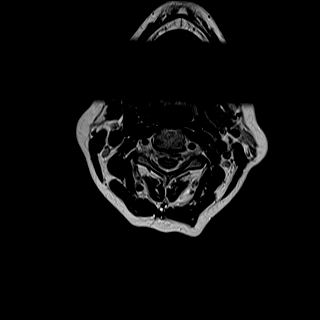
[im 26/36]
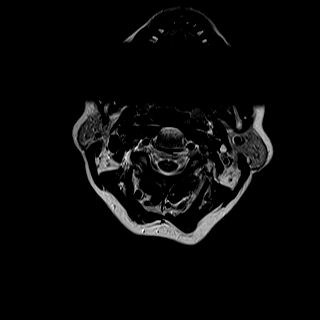
[im 31/36]
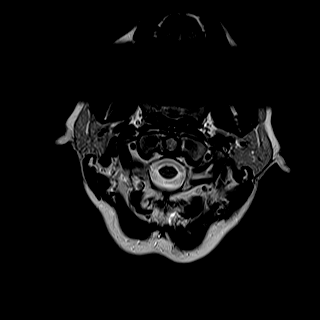
[im 36/36]
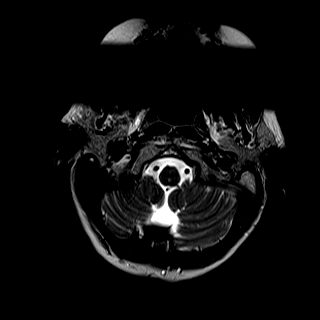

[31 of 48 positions shown; findings below may reference images not displayed]

FINDINGS: Alignment: Mild retrolisthesis C5-6.  Mild kyphosis.

Vertebrae: Negative for fracture or mass

Cord: Normal signal and morphology

Posterior Fossa, vertebral arteries, paraspinal tissues: Negative

Disc levels:

C2-3:  Negative

C3-4:  Negative

C4-5: Mild disc degeneration. Multilocular cyst projecting posterior
to the facet joint on the right. Several small 3-5 mm cysts are
present. Question synovial cyst. No significant surrounding soft
tissue edema. Mild facet degeneration is present at this level.

C5-6: Mild retrolisthesis. Mild disc degeneration or early spurring
without significant stenosis

C6-7:  Negative

C7-T1:  Negative
IMPRESSION: Cluster of small cysts posterior to the right C4-5 facet joint with
mild facet degeneration. Probable synovial cysts. Negative for
neural impingement.

Negative for disc protrusion or stenosis.

## 2019-02-28 ENCOUNTER — Other Ambulatory Visit (HOSPITAL_BASED_OUTPATIENT_CLINIC_OR_DEPARTMENT_OTHER): Payer: Self-pay | Admitting: Nurse Practitioner

## 2019-02-28 DIAGNOSIS — R221 Localized swelling, mass and lump, neck: Secondary | ICD-10-CM

## 2019-03-03 ENCOUNTER — Other Ambulatory Visit: Payer: Self-pay

## 2019-03-03 ENCOUNTER — Ambulatory Visit (HOSPITAL_BASED_OUTPATIENT_CLINIC_OR_DEPARTMENT_OTHER)
Admission: RE | Admit: 2019-03-03 | Discharge: 2019-03-03 | Disposition: A | Discharge status: Home / Self Care | Payer: 59 | Source: Ambulatory Visit | Attending: Nurse Practitioner | Admitting: Nurse Practitioner

## 2019-03-03 DIAGNOSIS — R221 Localized swelling, mass and lump, neck: Secondary | ICD-10-CM | POA: Insufficient documentation

## 2019-08-09 ENCOUNTER — Other Ambulatory Visit: Payer: Self-pay | Admitting: Nurse Practitioner

## 2019-08-09 DIAGNOSIS — Z13820 Encounter for screening for osteoporosis: Secondary | ICD-10-CM

## 2019-10-07 ENCOUNTER — Telehealth: Payer: Self-pay | Admitting: *Deleted

## 2019-10-07 NOTE — Telephone Encounter (Signed)
Pt called stating she needs a 2nd dose of the Moderna vaccine; the pt states that she her 1st dose was per her employer at a site in [place]; she was to get her 2nd dose today but was told that there may not be any left over doses for her to receive her vaccine today; recommendation given per CDC guidelines, "However, if it is not feasible to adhere to the recommended interval, the second dose of Pfizer-BioNTech and Moderna COVID-19 vaccines may be scheduled for administration up to 6 weeks (42) days after the first dose. There are currently limited data on the efficacy of mRNA COVID-19 vaccines administered beyond this window. If the second dose is administered beyond these intervals, there is no need to restart the series"; pt also advised that she should receive her 2nd dose at the same site/facility that she received her 1st dose; she verbalized understanding.

## 2019-10-28 ENCOUNTER — Other Ambulatory Visit: Payer: Self-pay | Admitting: Nurse Practitioner

## 2019-10-28 DIAGNOSIS — M810 Age-related osteoporosis without current pathological fracture: Secondary | ICD-10-CM

## 2019-11-01 ENCOUNTER — Ambulatory Visit
Admission: RE | Admit: 2019-11-01 | Discharge: 2019-11-01 | Disposition: A | Discharge status: Home / Self Care | Payer: 59 | Source: Ambulatory Visit | Attending: Nurse Practitioner | Admitting: Nurse Practitioner

## 2019-11-01 ENCOUNTER — Other Ambulatory Visit: Payer: Self-pay

## 2019-11-01 DIAGNOSIS — M810 Age-related osteoporosis without current pathological fracture: Secondary | ICD-10-CM

## 2020-11-20 ENCOUNTER — Other Ambulatory Visit: Payer: Self-pay | Admitting: Nurse Practitioner

## 2020-11-20 DIAGNOSIS — M810 Age-related osteoporosis without current pathological fracture: Secondary | ICD-10-CM

## 2021-05-27 ENCOUNTER — Other Ambulatory Visit: Payer: Self-pay

## 2021-05-27 ENCOUNTER — Ambulatory Visit
Admission: RE | Admit: 2021-05-27 | Discharge: 2021-05-27 | Disposition: A | Discharge status: Home / Self Care | Payer: 59 | Source: Ambulatory Visit | Attending: Nurse Practitioner | Admitting: Nurse Practitioner

## 2021-05-27 DIAGNOSIS — M810 Age-related osteoporosis without current pathological fracture: Secondary | ICD-10-CM

## 2021-08-22 ENCOUNTER — Ambulatory Visit: Payer: 59 | Admitting: Sports Medicine

## 2021-08-22 VITALS — BP 108/70 | Ht 65.0 in | Wt 130.0 lb

## 2021-08-22 DIAGNOSIS — G8929 Other chronic pain: Secondary | ICD-10-CM | POA: Diagnosis not present

## 2021-08-22 DIAGNOSIS — S93491A Sprain of other ligament of right ankle, initial encounter: Secondary | ICD-10-CM | POA: Diagnosis not present

## 2021-08-22 DIAGNOSIS — M25572 Pain in left ankle and joints of left foot: Secondary | ICD-10-CM

## 2021-08-22 DIAGNOSIS — M67979 Unspecified disorder of synovium and tendon, unspecified ankle and foot: Secondary | ICD-10-CM

## 2021-08-22 NOTE — Progress Notes (Signed)
PCP: Orpah Melter, MD  Subjective:   HPI: Patient is a 66 y.o. female here for bilateral ankle pain   She endorses spraining R ankle on 1/31 by rolling and inverting it. She noted bruising and swelling immediately after, worse the day after. She has always been able to bear weight on it. She has been icing and using ankle brace occasionally. She does endorse significant improvement. Denies any pain or difficulty with ambulation. She has a history of of prior surgery and did very well with her R ankle after that until her sprain.   She also endorses chronic L ankle pain on medial aspect and with a constant pulling. Cant run bcause pain swells.  Would try to stretch but was "fighting her" . Has been able to ambulate without issues. Was seen at the clinic for her L ankle pain several years ago and received an injection before COVID which she reports worked [employer] for a couple of months. MRI done of the left ankle in 2017 showed some arthritis and an intraosseous ganglion cyst.  Additionally, she states she has old orthostatics from May 2018 that she would like replaced if possible       Objective:  Physical Exam:  Gen: NAD, comfortable in exam room  R and L Feet: Inspection:  No obvious bony deformity b/l.  No swelling, erythema, or bruising b/l.  Normal arch b/l Palpation: No tenderness to palpation b/l ROM: Full  ROM of the ankle b/l. Normal midfoot flexibility b/l Strength: 5/5 strength ankle in all planes b/l Neurovascular: N/V intact distally in the lower extremity b/l Special tests: Positive anterior drawer on R side. Negative talar tilt. Negative squeeze. normal midfoot flexibility. Normal calcaneal motion with heel raise    Assessment & Plan:   1. R ankle sprain   Patient presents with acute R ankle pain, improved from when she sprained it 9 days ago. Negative [place] ankle rules so very low likelihood that anything is fractured and we will therefore not image at this time.   Advised to continue doing exercises as tolerated, and use her ankle brace during activity since that has been providing some relief. Return precautions discussed.   2. L posterior tibialis tendinopathy  Patient endorses L chronic ankle pain along the L posterior tibialis tendon. She does have a history of arthritis shown on MRI 6 years ago and suspect that is also contributing to pain. Physical exam reassuring. Discussed that we wouldn't recommend repeat steroid injections on that L tendon due to risk of rupture. Placed referral for PT. Also made new custom orthotics today which should help with some of her pain as well.   Patient seen and evaluated with the resident.  I agree with the above plan of care.  Right ankle sprain is improving.  She has excellent strength including resisted eversion and a normal gait.  I doubt that she has reinjured her peroneal tendon in the splint.  Her chronic left foot pain is secondary to posterior tibialis tendinopathy.  I recommended that we start with new custom orthotics and I will refer her for some physical therapy.  She may wean to home exercise program per the therapist discretion she will follow-up with me for ongoing or recalcitrant issues.  Patient was fitted for a : standard, cushioned, semi-rigid orthotic. The orthotic was heated and afterward the patient stood on the orthotic blank positioned on the orthotic stand. The patient was positioned in subtalar neutral position and 10 degrees of ankle dorsiflexion in  a weight bearing stance. After completion of molding, a stable base was applied to the orthotic blank. The blank was ground to a stable position for weight bearing. Size: 7 Base: Blue EVA Posting: none Additional orthotic padding: none

## 2021-09-12 ENCOUNTER — Ambulatory Visit: Payer: 59 | Admitting: Physical Therapy

## 2021-09-12 ENCOUNTER — Other Ambulatory Visit: Payer: Self-pay | Admitting: Obstetrics and Gynecology

## 2021-09-12 DIAGNOSIS — R928 Other abnormal and inconclusive findings on diagnostic imaging of breast: Secondary | ICD-10-CM

## 2021-09-16 ENCOUNTER — Ambulatory Visit: Payer: 59

## 2021-09-19 ENCOUNTER — Ambulatory Visit: Payer: 59 | Attending: Sports Medicine | Admitting: Physical Therapy

## 2021-09-19 ENCOUNTER — Encounter: Payer: Self-pay | Admitting: Physical Therapy

## 2021-09-19 ENCOUNTER — Other Ambulatory Visit: Payer: Self-pay

## 2021-09-19 DIAGNOSIS — R252 Cramp and spasm: Secondary | ICD-10-CM | POA: Diagnosis present

## 2021-09-19 DIAGNOSIS — M25672 Stiffness of left ankle, not elsewhere classified: Secondary | ICD-10-CM

## 2021-09-19 DIAGNOSIS — M25572 Pain in left ankle and joints of left foot: Secondary | ICD-10-CM

## 2021-09-19 DIAGNOSIS — M6281 Muscle weakness (generalized): Secondary | ICD-10-CM | POA: Diagnosis present

## 2021-09-19 DIAGNOSIS — G8929 Other chronic pain: Secondary | ICD-10-CM | POA: Diagnosis not present

## 2021-09-19 NOTE — Patient Instructions (Signed)
Access Code: [email or web address] ?URL: [email or web address] ?Date: 09/19/2021 ?Prepared by: Almyra Free ? ?Exercises ?Single Leg Heel Raise on Step - 1 x daily - 3-4 x weekly - 3 sets - 10 reps - 3 sec hold ?Gastroc Stretch on Wall - 2 x daily - 7 x weekly - 1 sets - 3 reps - 30-60 sec hold ?Soleus Stretch on Wall - 2 x daily - 7 x weekly - 1 sets - 3 reps - 30-60 sec hold ? ?

## 2021-09-19 NOTE — Therapy (Signed)
[facility] [address]  [place], [place], [address] Phone: [phone]   Fax:  [phone]  Physical Therapy Evaluation  Patient Details  Name: Chelsea Vega MRN: [id number] Date of Birth: 1956/02/08 Referring Provider (PT): Everlean Alstrom   Encounter Date: 09/19/2021   PT End of Session - 09/19/21 1017     Visit Number 1    Date for PT Re-Evaluation 10/31/21    Authorization Type UHC    PT Start Time 1017    PT Stop Time 1101    PT Time Calculation (min) 44 min    Activity Tolerance Patient tolerated treatment well    Behavior During Therapy WFL for tasks assessed/performed             Past Medical History:  Diagnosis Date   Abnormal vaginal Pap smear    DISTANT HX- S/P LEEP   Allergic rhinitis    Anemia    pregnancy   Asthma    childhood, 66   Constipation    Depression    Dysrhythmia    occassional PVC's   GERD (gastroesophageal reflux disease)    Hx of cystitis    RECURRENT, DR. TANNENBAUM   Insomnia    Osteoporosis 10/2008   DR. [place], [place] 10/2008   Postmenopausal    PVC's (premature ventricular contractions)    HX OF   Slow heart rate    Tinnitus    Trapezius muscle spasm    Vitamin D deficiency     Past Surgical History:  Procedure Laterality Date   ANKLE SURGERY     ANTERIOR AND POSTERIOR REPAIR N/A 05/24/2015   Procedure: ANTERIOR (CYSTOCELE) AND POSTERIOR REPAIR (RECTOCELE);  Surgeon: Dian Queen, MD;  Location: [facility];  Service: Gynecology;  Laterality: N/A;   DIAGNOSTIC LAPAROSCOPY  2012   removed tubes and ovaries   GANGLION CYST EXCISION     SEPTOPLASTY     VAGINAL HYSTERECTOMY  05/24/2015   Procedure: HYSTERECTOMY VAGINAL;  Surgeon: Dian Queen, MD;  Location: [facility];  Service: Gynecology;;  uterus and cervix only    There were no vitals filed for this visit.    Subjective Assessment - 09/19/21 1020     Subjective Since 2017, she has had left ankle  problems which she associates with buying [employer] shoes. There is also a slanted greenway that she walks and used to run on which may have contributed. She used to be a long distance runner. Occassionally she'll test out running again but mainly just walks now. She has intermittent shooting/stabbing pains in medial ankle and sometimes feels as if it will give way. Pulling feeling in medial lower leg. Sidestepping hurts sometime.    Pertinent History osteopenia/OP    Diagnostic tests MRI 2017:    Patient Stated Goals love to get back to running, get rid of pain    Currently in Pain? No/denies    Pain Score 7     Pain Location Ankle    Pain Orientation Left;Medial    Pain Descriptors / Indicators Shooting;Stabbing    Pain Type Chronic pain    Pain Onset More than a month ago    Pain Frequency Intermittent    Aggravating Factors  unsure                [facility] PT Assessment - 09/19/21 0001       Assessment   Medical Diagnosis chronic left ankle pain    Referring Provider (PT)  Everlean Alstrom    Onset Date/Surgical Date 12/27/15    Hand Dominance Right    Next MD Visit as needed    Prior Therapy for right ankle peroneal tendon repair      Precautions   Precautions None      Restrictions   Weight Bearing Restrictions No      Balance Screen   Has the patient fallen in the past 6 months No    Has the patient had a decrease in activity level because of a fear of falling?  No    Is the patient reluctant to leave their home because of a fear of falling?  No      Home Ecologist residence    Living Arrangements Spouse/significant other      Prior Function   Level of Independence Independent      Observation/Other Assessments   Focus on Therapeutic Outcomes (FOTO)  82 same as predicted      Posture/Postural Control   Posture Comments Rt foot hallux valgus and pes cavus      ROM / Strength   AROM / PROM / Strength AROM;PROM;Strength      AROM    Overall AROM Comments Lt ankle active DF 8 and Ever 30 deg    AROM Assessment Site Ankle    Right/Left Ankle Left    Left Ankle Dorsiflexion -5    Left Ankle Plantar Flexion 57    Left Ankle Inversion 35    Left Ankle Eversion 12      PROM   Overall PROM Comments Right ankle DF 10 and Ever 30 deg    PROM Assessment Site Ankle    Right/Left Ankle Left    Left Ankle Dorsiflexion 7    Left Ankle Plantar Flexion 63    Left Ankle Inversion 45    Left Ankle Eversion 38      Strength   Overall Strength Comments Left hip 5/5, except ext 4+/5; Lt knee flex 5/5    Strength Assessment Site Ankle    Right/Left Ankle Left    Left Ankle Dorsiflexion 5/5    Left Ankle Plantar Flexion 4+/5    Left Ankle Inversion 4-/5    Left Ankle Eversion --   5-/5     Palpation   Patella mobility left  talocrural A/P mobility decreased compared to Rt.; decreased left cuboid mobility and painful    Palpation comment marked tenderness of left post tib tendon at medial malleolus; also tender along tibial crest proximally, and in medial left gastroc/soleus muscles                        Objective measurements completed on examination: See above findings.       [facility] Adult PT Treatment/Exercise - 09/19/21 0001       Self-Care   Self-Care Other Self-Care Comments    Other Self-Care Comments  DN education; POC      Exercises   Exercises Ankle      Ankle Exercises: Stretches   Soleus Stretch 1 rep;30 seconds    Gastroc Stretch 1 rep;30 seconds      Ankle Exercises: Standing   Heel Raises Left;3 seconds    Heel Raises Limitations tempo: 3 sec up, hold 3 sec, 3 sec down 3 reps for HEP (gave 3 sets of 10 with 1.5 min rest in between sets)  PT Education - 09/19/21 1101     Education Details HEP, DN ED    Person(s) Educated Patient    Methods Explanation;Demonstration;Handout    Comprehension Verbalized understanding;Returned demonstration               PT Short Term Goals - 09/19/21 1221       PT SHORT TERM GOAL #1   Title Ind with initial HEP    Time 2    Period Weeks    Status New    Target Date 10/03/21               PT Long Term Goals - 09/19/21 1221       PT LONG TERM GOAL #1   Title Independent with ongoing HEP    Status New    Target Date 10/31/21      PT LONG TERM GOAL #2   Title Improved left gastroc/soleus flexibiility to allow left ankle DF to >= 8 deg    Status New    Target Date 10/31/21      PT LONG TERM GOAL #3   Title decreased frequency and intensity of left ankle pain by >= 75% with ADLs    Status New    Target Date 10/31/21      PT LONG TERM GOAL #4   Title improved left ankle strength to 5/5 and no reports of ankle giving way.    Status New    Target Date 10/31/21      PT LONG TERM GOAL #5   Title -                    Plan - 09/19/21 1105     Clinical Impression Statement Patient is a 66 y.o F who presents with chronic left ankle pain x 6 years. MRI at the time showed mild achilles tendonitis and retrocalcaneal bursitis. Sx today present more as post tib tedninitis. Pain in the ankle is intermittent, sharp and shooting and affects her abiity to run and sometimes walk. She also feels that the ankle may give way at times. She has limited left ankle DF and eversion and decreased strength with PF and inversion. She has decreased talocrural and cuboid mobility and marked tenderness of the post tib tendon and post lower leg muscles. She will benefit from skilled PT to address these deficits and return her to her previous painfree level of function.    Personal Factors and Comorbidities Time since onset of injury/illness/exacerbation    Stability/Clinical Decision Making Stable/Uncomplicated    Clinical Decision Making Low    Rehab Potential Excellent    PT Frequency 2x / week   1-2x / wk   PT Duration 6 weeks    PT Treatment/Interventions ADLs/Self Care Home  Management;Cryotherapy;Electrical Stimulation;Iontophoresis '4mg'$ /ml Dexamethasone;Moist Heat;Ultrasound;Neuromuscular re-education;Balance training;Therapeutic exercise;Therapeutic activities;Patient/family education;Manual techniques;Dry needling;Taping    PT Next Visit Plan DN/MT to left post tib, gastroc/soleus; cuboid and talocrural mobs, ankle/foot strengthening and flexibility; HEP - add eversion strength    PT Home Exercise Plan [email or web address]    Consulted and Agree with Plan of Care Patient             Patient will benefit from skilled therapeutic intervention in order to improve the following deficits and impairments:  Decreased range of motion, Pain, Increased muscle spasms, Postural dysfunction, Decreased strength, Decreased mobility, Impaired flexibility, Decreased activity tolerance  Visit Diagnosis: Pain in left ankle and joints of left foot  Stiffness of left ankle, not elsewhere  classified  Muscle weakness (generalized)  Cramp and spasm     Problem List Patient Active Problem List   Diagnosis Date Noted   Prolapse of 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  [place], [place], [address] Phone: [phone]   Fax:  [phone]  Name: RAKEL JUNIO MRN: [id number] Date of Birth: 06/05/1956

## 2021-09-23 ENCOUNTER — Ambulatory Visit: Payer: 59

## 2021-09-23 ENCOUNTER — Other Ambulatory Visit: Payer: Self-pay

## 2021-09-23 DIAGNOSIS — R252 Cramp and spasm: Secondary | ICD-10-CM

## 2021-09-23 DIAGNOSIS — M25672 Stiffness of left ankle, not elsewhere classified: Secondary | ICD-10-CM

## 2021-09-23 DIAGNOSIS — M6281 Muscle weakness (generalized): Secondary | ICD-10-CM

## 2021-09-23 DIAGNOSIS — M25572 Pain in left ankle and joints of left foot: Secondary | ICD-10-CM | POA: Diagnosis not present

## 2021-09-23 NOTE — Therapy (Signed)
[facility] ?[place] ?[place], [place], [address] ?Phone: [phone]   Fax:  [phone] ? ?Physical Therapy Treatment ? ?Patient Details  ?Name: Chelsea Vega ?MRN: [id number] ?Date of Birth: 1955-08-15 ?Referring Provider (PT): Everlean Alstrom ? ? ?Encounter Date: 09/23/2021 ? ? PT End of Session - 09/23/21 1105   ? ? Visit Number 2   ? Date for PT Re-Evaluation 10/31/21   ? Authorization Type UHC   ? PT Start Time 1019   ? PT Stop Time 1100   ? PT Time Calculation (min) 41 min   ? Activity Tolerance Patient tolerated treatment well   ? Behavior During Therapy [facility] for tasks assessed/performed   ? ?  ?  ? ?  ? ? ?Past Medical History:  ?Diagnosis Date  ? Abnormal vaginal Pap smear   ? DISTANT HX- S/P LEEP  ? Allergic rhinitis   ? Anemia   ? pregnancy  ? Asthma   ? childhood, early 20's  ? Constipation   ? Depression   ? Dysrhythmia   ? occassional PVC's  ? GERD (gastroesophageal reflux disease)   ? Hx of cystitis   ? RECURRENT, DR. TANNENBAUM  ? Insomnia   ? Osteoporosis 10/2008  ? DR. Helane Rima, DEXA 10/2008  ? Postmenopausal   ? PVC's (premature ventricular contractions)   ? HX OF  ? Slow heart rate   ? Tinnitus   ? Trapezius muscle spasm   ? Vitamin D deficiency   ? ? ?Past Surgical History:  ?Procedure Laterality Date  ? ANKLE SURGERY    ? ANTERIOR AND POSTERIOR REPAIR N/A 05/24/2015  ? Procedure: ANTERIOR (CYSTOCELE) AND POSTERIOR REPAIR (RECTOCELE);  Surgeon: Dian Queen, MD;  Location: [facility];  Service: Gynecology;  Laterality: N/A;  ? DIAGNOSTIC LAPAROSCOPY  2012  ? removed tubes and ovaries  ? GANGLION CYST EXCISION    ? SEPTOPLASTY    ? VAGINAL HYSTERECTOMY  05/24/2015  ? Procedure: HYSTERECTOMY VAGINAL;  Surgeon: Dian Queen, MD;  Location: [facility];  Service: Gynecology;;  uterus and cervix only  ? ? ?There were no vitals filed for this visit. ? ? Subjective Assessment - 09/23/21 1021   ? ? Subjective Pt reports medial shooting pain  occasionally, seems to be more when stepping fwd and to the side.   ? Pertinent History osteopenia/OP   ? Diagnostic tests MRI 2017:   ? Patient Stated Goals love to get back to running, get rid of pain   ? Currently in Pain? No/denies   ? ?  ?  ? ?  ? ? ? ? ? ? ? ? ? ? ? ? ? ? ? ? ? ? ? ? [facility] Adult PT Treatment/Exercise - 09/23/21 0001   ? ?  ? Manual Therapy  ? Manual Therapy Soft tissue mobilization;Myofascial release   ? Myofascial Release STM and TPR to L lateral gastroc/soleus, tib posterior   ?  ? Ankle Exercises: Aerobic  ? Recumbent Bike [id number]   ?  ? Ankle Exercises: Stretches  ? Soleus Stretch 1 rep;30 seconds   runner stretch  ? Gastroc Stretch 1 rep;30 seconds   runner stretch  ?  ? Ankle Exercises: Standing  ? Heel Raises Left;3 seconds   ? Heel Raises Limitations tempo: 3 sec up, hold 3 sec, 3 sec down 3 reps for   ?  ? Ankle Exercises: Seated  ? Other Seated Ankle Exercises L ankle eversion and DF  with RTB x 10 each   ? ?  ?  ? ?  ? ? ? ? ? ? ? ? ? ? PT Education - 09/23/21 1101   ? ? Education Details HEP update and review   ? Person(s) Educated Patient   ? Methods Explanation;Demonstration;Handout   ? Comprehension Verbalized understanding;Returned demonstration   ? ?  ?  ? ?  ? ? ? PT Short Term Goals - 09/23/21 1113   ? ?  ? PT SHORT TERM GOAL #1  ? Title Ind with initial HEP   ? Time 2   ? Period Weeks   ? Status On-going   ? Target Date 10/03/21   ? ?  ?  ? ?  ? ? ? ? PT Long Term Goals - 09/23/21 1113   ? ?  ? PT LONG TERM GOAL #1  ? Title Independent with ongoing HEP   ? Status On-going   ? Target Date 10/31/21   ?  ? PT LONG TERM GOAL #2  ? Title Improved left gastroc/soleus flexibiility to allow left ankle DF to >= 8 deg   ? Status On-going   ? Target Date 10/31/21   ?  ? PT LONG TERM GOAL #3  ? Title decreased frequency and intensity of left ankle pain by >= 75% with ADLs   ? Status On-going   ? Target Date 10/31/21   ?  ? PT LONG TERM GOAL #4  ? Title improved left ankle strength to 5/5  and no reports of ankle giving way.   ? Status On-going   ? Target Date 10/31/21   ?  ? PT LONG TERM GOAL #5  ? Title -   ? ?  ?  ? ?  ? ? ? ? ? ? ? ? Plan - 09/23/21 1105   ? ? Clinical Impression Statement Pt presented today showing tightness along the L leg and ankle musculature, however after STM improvements were made. She did not require much instruction with the exercises at home, added resisted eversion and DF to HEP. She is interested in DN, most areas that were noted with trigger points were lateral gastroc/soleus and tib. posterior.   ? Personal Factors and Comorbidities Time since onset of injury/illness/exacerbation   ? PT Frequency 2x / week   1-2x per week  ? PT Duration 6 weeks   ? PT Treatment/Interventions ADLs/Self Care Home Management;Cryotherapy;Electrical Stimulation;Iontophoresis '4mg'$ /ml Dexamethasone;Moist Heat;Ultrasound;Neuromuscular re-education;Balance training;Therapeutic exercise;Therapeutic activities;Patient/family education;Manual techniques;Dry needling;Taping   ? PT Next Visit Plan DN/MT to left post tib, gastroc/soleus; cuboid and talocrural mobs, ankle/foot strengthening and flexibility; HEP - add eversion strength   ? PT Home Exercise Plan [facility]   ? Consulted and Agree with Plan of Care Patient   ? ?  ?  ? ?  ? ? ?Patient will benefit from skilled therapeutic intervention in order to improve the following deficits and impairments:  Decreased range of motion, Pain, Increased muscle spasms, Postural dysfunction, Decreased strength, Decreased mobility, Impaired flexibility, Decreased activity tolerance ? ?Visit Diagnosis: ?Pain in left ankle and joints of left foot ? ?Stiffness of left ankle, not elsewhere classified ? ?Muscle weakness (generalized) ? ?Cramp and spasm ? ? ? ? ?Problem List ?Patient Active Problem List  ? Diagnosis Date Noted  ? Prolapse of female pelvic organs 05/24/2015  ? Ankle sprain 03/06/2011  ? ? ?Artist Pais, PTA ?09/23/2021, 11:34 AM ? ?[facility] ?[place]  [address] ?[place], [place], [address] ?Phone: [phone]   Fax:  [phone] ? ?Name: Chelsea Vega ?MRN: [id number] ?Date of Birth: Mar 27, 1956 ? ? ? ?

## 2021-09-23 NOTE — Patient Instructions (Signed)
Access Code: [place] ?URL: [email or web address] ?Date: 09/23/2021 ?Prepared by: Clarene Essex ? ?Exercises ?Single Leg Heel Raise on Step - 1 x daily - 3-4 x weekly - 3 sets - 10 reps - 3 sec hold ?Gastroc Stretch on Wall - 2 x daily - 7 x weekly - 1 sets - 3 reps - 30-60 sec hold ?Soleus Stretch on Wall - 2 x daily - 7 x weekly - 1 sets - 3 reps - 30-60 sec hold ?Long Sitting Ankle Eversion with Resistance - 1 x daily - 7 x weekly - 3 sets - 10 reps ?Long Sitting Ankle Dorsiflexion with Anchored Resistance - 1 x daily - 7 x weekly - 3 sets - 10 reps ? ?

## 2021-09-26 ENCOUNTER — Encounter: Payer: Self-pay | Admitting: Physical Therapy

## 2021-09-26 ENCOUNTER — Other Ambulatory Visit: Payer: Self-pay

## 2021-09-26 ENCOUNTER — Ambulatory Visit: Payer: 59 | Admitting: Physical Therapy

## 2021-09-26 DIAGNOSIS — M25572 Pain in left ankle and joints of left foot: Secondary | ICD-10-CM | POA: Diagnosis not present

## 2021-09-26 NOTE — Patient Instructions (Signed)

## 2021-09-26 NOTE — Therapy (Signed)
[facility] ?[facility] ?[place] ?[place], [place], [address] ?Phone: [phone]   Fax:  [phone] ? ?Physical Therapy Treatment ? ?Patient Details  ?Name: Chelsea Vega ?MRN: [id number] ?Date of Birth: [birth year] ?Referring Provider (PT): Everlean Alstrom ? ? ?Encounter Date: 09/26/2021 ? ? PT End of Session - 09/26/21 1109   ? ? Visit Number 3   ? Date for PT Re-Evaluation 10/31/21   ? Authorization Type UHC   ? PT Start Time 1107   ? PT Stop Time 1145   ? PT Time Calculation (min) 38 min   ? Activity Tolerance Patient tolerated treatment well   ? Behavior During Therapy [facility] for tasks assessed/performed   ? ?  ?  ? ?  ? ? ?Past Medical History:  ?Diagnosis Date  ? Abnormal vaginal Pap smear   ? DISTANT HX- S/P LEEP  ? Allergic rhinitis   ? Anemia   ? pregnancy  ? Asthma   ? childhood, early 20's  ? Constipation   ? Depression   ? Dysrhythmia   ? occassional PVC's  ? GERD (gastroesophageal reflux disease)   ? Hx of cystitis   ? RECURRENT, DR. TANNENBAUM  ? Insomnia   ? Osteoporosis 10/2008  ? DR. Helane Rima, DEXA 10/2008  ? Postmenopausal   ? PVC's (premature ventricular contractions)   ? HX OF  ? Slow heart rate   ? Tinnitus   ? Trapezius muscle spasm   ? Vitamin D deficiency   ? ? ?Past Surgical History:  ?Procedure Laterality Date  ? ANKLE SURGERY    ? ANTERIOR AND POSTERIOR REPAIR N/A 05/24/2015  ? Procedure: ANTERIOR (CYSTOCELE) AND POSTERIOR REPAIR (RECTOCELE);  Surgeon: Dian Queen, MD;  Location: [facility];  Service: Gynecology;  Laterality: N/A;  ? DIAGNOSTIC LAPAROSCOPY  2012  ? removed tubes and ovaries  ? GANGLION CYST EXCISION    ? SEPTOPLASTY    ? VAGINAL HYSTERECTOMY  05/24/2015  ? Procedure: HYSTERECTOMY VAGINAL;  Surgeon: Dian Queen, MD;  Location: [facility];  Service: Gynecology;;  uterus and cervix only  ? ? ?There were no vitals filed for this visit. ? ? Subjective Assessment - 09/26/21 1108   ? ? Subjective Pt. reports continued medial ankle pain  pulling up into calf.  Just went walking.  Doing her exercises, fatigued with heel raises and then int cramps, can only do 8.   ? Pertinent History osteopenia/OP   ? Diagnostic tests MRI 2017:   ? Patient Stated Goals love to get back to running, get rid of pain   ? Currently in Pain? Yes   ? Pain Score 2    ? Pain Location Ankle   ? Pain Orientation Left;Medial   ? ?  ?  ? ?  ? ? ? ? ? ? ? ? ? ? ? ? ? ? ? ? ? ? ? ? [facility] Adult PT Treatment/Exercise - 09/26/21 0001   ? ?  ? Manual Therapy  ? Manual Therapy Myofascial release;Other (comment)   ? Manual therapy comments to LLE to decrease pain and improve gait mechanics   ? Myofascial Release STM and TPR to L lateral gastroc/soleus, tib posterior   ? Other Manual Therapy skilled palpation and monitoring during dry needling.   ?  ? Ankle Exercises: Aerobic  ? Nustep L5 x 5 min   ?  ? Ankle Exercises: Standing  ? BAPS Sitting;Level 2;Limitations   DF/PF x 20, Inv/Ev x  20, CW x 20, CCW x 20 min A to stabilized  ? Heel Raises Both;10 reps   ? Toe Raise 10 reps   ?  ? Ankle Exercises: Seated  ? Towel Crunch 3 reps   ? Marble Pickup x 8- 3 rounds inv/ev   ?  ? Ankle Exercises: Stretches  ? Soleus Stretch 1 rep;30 seconds   ? Gastroc Stretch 1 rep;30 seconds   ? ?  ?  ? ?  ? ? ? Trigger Point Dry Needling - 09/26/21 0001   ? ? Consent Given? Yes   ? Education Handout Provided Yes   ? Muscles Treated Lower Quadrant Gastrocnemius;Soleus;Flexor Digitorum Longus;Posterior tibialis   ? Dry Needling Comments LLE, medial   ? Posterior tibialis Response Palpable increased muscle length;Twitch response elicited   ? Gastrocnemius Response Palpable increased muscle length;Twitch response elicited   ? Soleus Response Palpable increased muscle length   ? Flexor digitorum longus Response Palpable increased muscle length   ? ?  ?  ? ?  ? ? ? ? ? ? ? ? PT Education - 09/26/21 1157   ? ? Education Details iscussed potential trial of DN to help reduce muscle tension and guarding which is  limiting her ROM progression and contributing to her pain. After explanation of DN rational, procedures, outcomes and potential side effects, patient verbalized consent to DN treatment in conjunction with manual STM/DTM and TPR to reduce ttp/muscle tension   ? Person(s) Educated Patient   ? Methods Explanation;Handout   ? Comprehension Verbalized understanding   ? ?  ?  ? ?  ? ? ? PT Short Term Goals - 09/23/21 1113   ? ?  ? PT SHORT TERM GOAL #1  ? Title Ind with initial HEP   ? Time 2   ? Period Weeks   ? Status On-going   ? Target Date 10/03/21   ? ?  ?  ? ?  ? ? ? ? PT Long Term Goals - 09/23/21 1113   ? ?  ? PT LONG TERM GOAL #1  ? Title Independent with ongoing HEP   ? Status On-going   ? Target Date 10/31/21   ?  ? PT LONG TERM GOAL #2  ? Title Improved left gastroc/soleus flexibiility to allow left ankle DF to >= 8 deg   ? Status On-going   ? Target Date 10/31/21   ?  ? PT LONG TERM GOAL #3  ? Title decreased frequency and intensity of left ankle pain by >= 75% with ADLs   ? Status On-going   ? Target Date 10/31/21   ?  ? PT LONG TERM GOAL #4  ? Title improved left ankle strength to 5/5 and no reports of ankle giving way.   ? Status On-going   ? Target Date 10/31/21   ?  ? PT LONG TERM GOAL #5  ? Title -   ? ?  ?  ? ?  ? ? ? ? ? ? ? ? Plan - 09/26/21 1154   ? ? Clinical Impression Statement Pt. reports good compliance with HEP but having difficulty with heel raises due to cramping.  She tolerated ankle exercises today without complaint.  With STM noted tigger points in gastroc/soleus and post tib, after education consented to DN which she tolerated well.  Reported decreased tightness following, still noted some tightness with heel raises in L calf though.  She would benefit from continued skilled therapy.   ? Personal Factors  and Comorbidities Time since onset of injury/illness/exacerbation   ? PT Frequency 2x / week   1-2x per week  ? PT Duration 6 weeks   ? PT Treatment/Interventions ADLs/Self Care Home  Management;Cryotherapy;Electrical Stimulation;Iontophoresis '4mg'$ /ml Dexamethasone;Moist Heat;Ultrasound;Neuromuscular re-education;Balance training;Therapeutic exercise;Therapeutic activities;Patient/family education;Manual techniques;Dry needling;Taping   ? PT Next Visit Plan DN/MT to left post tib, gastroc/soleus; cuboid and talocrural mobs, ankle/foot strengthening and flexibility; HEP - add eversion strength   ? PT Home Exercise Plan [facility]   ? Consulted and Agree with Plan of Care Patient   ? ?  ?  ? ?  ? ? ?Patient will benefit from skilled therapeutic intervention in order to improve the following deficits and impairments:  Decreased range of motion, Pain, Increased muscle spasms, Postural dysfunction, Decreased strength, Decreased mobility, Impaired flexibility, Decreased activity tolerance ? ?Visit Diagnosis: ?Pain in left ankle and joints of left foot ? ?Stiffness of left ankle, not elsewhere classified ? ?Muscle weakness (generalized) ? ?Cramp and spasm ? ? ? ? ?Problem List ?Patient Active Problem List  ? Diagnosis Date Noted  ? Prolapse of female pelvic organs 05/24/2015  ? Ankle sprain 03/06/2011  ? ? ?Rennie Natter, PT, DPT  ?09/26/2021, 11:57 AM ? ?[facility] ?[facility] ?[address] [place] ?[place], [place], [address] ?Phone: [phone]   Fax:  [phone] ? ?Name: Chelsea Vega ?MRN: [id number] ?Date of Birth: 23-Jan-1956 ? ? ? ?

## 2021-10-01 ENCOUNTER — Other Ambulatory Visit: Payer: Self-pay

## 2021-10-01 ENCOUNTER — Other Ambulatory Visit: Payer: Self-pay | Admitting: Obstetrics and Gynecology

## 2021-10-01 ENCOUNTER — Ambulatory Visit: Payer: 59

## 2021-10-01 ENCOUNTER — Ambulatory Visit
Admission: RE | Admit: 2021-10-01 | Discharge: 2021-10-01 | Disposition: A | Discharge status: Home / Self Care | Payer: 59 | Source: Ambulatory Visit | Attending: Obstetrics and Gynecology | Admitting: Obstetrics and Gynecology

## 2021-10-01 ENCOUNTER — Ambulatory Visit: Payer: 59 | Admitting: Physical Therapy

## 2021-10-01 DIAGNOSIS — R928 Other abnormal and inconclusive findings on diagnostic imaging of breast: Secondary | ICD-10-CM

## 2021-10-01 DIAGNOSIS — M25672 Stiffness of left ankle, not elsewhere classified: Secondary | ICD-10-CM

## 2021-10-01 DIAGNOSIS — R252 Cramp and spasm: Secondary | ICD-10-CM

## 2021-10-01 DIAGNOSIS — M25572 Pain in left ankle and joints of left foot: Secondary | ICD-10-CM

## 2021-10-01 DIAGNOSIS — M6281 Muscle weakness (generalized): Secondary | ICD-10-CM

## 2021-10-01 NOTE — Therapy (Signed)
[place] ?[facility] ?[address] ?[place], [place], [address] ?Phone: [phone]   Fax:  [phone] ? ?Physical Therapy Treatment ? ?Patient Details  ?Name: Chelsea Vega ?MRN: [id number] ?Date of Birth: 05/15/1956 ?Referring Provider (PT): Everlean Alstrom ? ? ?Encounter Date: 10/01/2021 ? ? PT End of Session - 10/01/21 1444   ? ? Visit Number 4   ? Date for PT Re-Evaluation 10/31/21   ? Authorization Type UHC   ? PT Start Time 3151   ? PT Stop Time 7616   ? PT Time Calculation (min) 38 min   ? Activity Tolerance Patient tolerated treatment well   ? Behavior During Therapy [facility] for tasks assessed/performed   ? ?  ?  ? ?  ? ? ?Past Medical History:  ?Diagnosis Date  ? Abnormal vaginal Pap smear   ? DISTANT HX- S/P LEEP  ? Allergic rhinitis   ? Anemia   ? pregnancy  ? Asthma   ? childhood, early 20's  ? Constipation   ? Depression   ? Dysrhythmia   ? occassional PVC's  ? GERD (gastroesophageal reflux disease)   ? Hx of cystitis   ? RECURRENT, DR. TANNENBAUM  ? Insomnia   ? Osteoporosis 10/2008  ? DR. Helane Rima, DEXA 10/2008  ? Postmenopausal   ? PVC's (premature ventricular contractions)   ? HX OF  ? Slow heart rate   ? Tinnitus   ? Trapezius muscle spasm   ? Vitamin D deficiency   ? ? ?Past Surgical History:  ?Procedure Laterality Date  ? ANKLE SURGERY    ? ANTERIOR AND POSTERIOR REPAIR N/A 05/24/2015  ? Procedure: ANTERIOR (CYSTOCELE) AND POSTERIOR REPAIR (RECTOCELE);  Surgeon: Dian Queen, MD;  Location: [facility];  Service: Gynecology;  Laterality: N/A;  ? BREAST CYST ASPIRATION    ? 2005  ? DIAGNOSTIC LAPAROSCOPY  07/14/2010  ? removed tubes and ovaries  ? GANGLION CYST EXCISION    ? SEPTOPLASTY    ? VAGINAL HYSTERECTOMY  05/24/2015  ? Procedure: HYSTERECTOMY VAGINAL;  Surgeon: Dian Queen, MD;  Location: [facility];  Service: Gynecology;;  uterus and cervix only  ? ? ?There were no vitals filed for this visit. ? ? Subjective Assessment - 10/01/21 1406   ? ?  Subjective Pt reports DN and STM seems to help, no pain today.   ? Pertinent History osteopenia/OP   ? Diagnostic tests MRI 2017:   ? Patient Stated Goals love to get back to running, get rid of pain   ? Currently in Pain? No/denies   ? ?  ?  ? ?  ? ? ? ? ? ? ? ? ? ? ? ? ? ? ? ? ? ? ? ? [place] Adult PT Treatment/Exercise - 10/01/21 0001   ? ?  ? Ankle Exercises: Seated  ? Other Seated Ankle Exercises L 4 way ankle with RTB, 2x10 each direction   ?  ? Ankle Exercises: Aerobic  ? Nustep L5 x 6 min   ?  ? Ankle Exercises: Standing  ? Other Standing Ankle Exercises fwd and lateral step up 6' 2x10; fwd step down lead with R LE x 10 with hip in ER   hip in ER to prevent genu valgus  ?  ? Ankle Exercises: Machines for Strengthening  ? Cybex Leg Press calf press 25lb 2x10   ? ?  ?  ? ?  ? ? ? ? ? ? ? ? ? ?  PT Education - 10/01/21 1443   ? ? Education Details added 4 way ankle to HEP   ? Person(s) Educated Patient   ? Methods Explanation;Demonstration;Handout   ? Comprehension Verbalized understanding;Returned demonstration   ? ?  ?  ? ?  ? ? ? PT Short Term Goals - 09/23/21 1113   ? ?  ? PT SHORT TERM GOAL #1  ? Title Ind with initial HEP   ? Time 2   ? Period Weeks   ? Status On-going   ? Target Date 10/03/21   ? ?  ?  ? ?  ? ? ? ? PT Long Term Goals - 09/23/21 1113   ? ?  ? PT LONG TERM GOAL #1  ? Title Independent with ongoing HEP   ? Status On-going   ? Target Date 10/31/21   ?  ? PT LONG TERM GOAL #2  ? Title Improved left gastroc/soleus flexibiility to allow left ankle DF to >= 8 deg   ? Status On-going   ? Target Date 10/31/21   ?  ? PT LONG TERM GOAL #3  ? Title decreased frequency and intensity of left ankle pain by >= 75% with ADLs   ? Status On-going   ? Target Date 10/31/21   ?  ? PT LONG TERM GOAL #4  ? Title improved left ankle strength to 5/5 and no reports of ankle giving way.   ? Status On-going   ? Target Date 10/31/21   ?  ? PT LONG TERM GOAL #5  ? Title -   ? ?  ?  ? ?  ? ? ? ? ? ? ? ? Plan - 10/01/21 1450    ? ? Clinical Impression Statement Pt today had no complaints of tightness in the lower leg, indicating some improvement from DN and MT. Progressed HEP with 4 way ankle to improve LE stabilization. She noted a little fatigue with the step ups, cues needed for ec control and for L hip ER on step down to prevent genu valgus. Pt w/o any increased pain from today's session.   ? Personal Factors and Comorbidities Time since onset of injury/illness/exacerbation   ? PT Frequency 2x / week   1-2x per week  ? PT Duration 6 weeks   ? PT Treatment/Interventions ADLs/Self Care Home Management;Cryotherapy;Electrical Stimulation;Iontophoresis '4mg'$ /ml Dexamethasone;Moist Heat;Ultrasound;Neuromuscular re-education;Balance training;Therapeutic exercise;Therapeutic activities;Patient/family education;Manual techniques;Dry needling;Taping   ? PT Next Visit Plan cuboid and talocrural mobs, ankle/foot strengthening and flexibility; HEP - add eversion strength   ? PT Home Exercise Plan [facility]   ? Consulted and Agree with Plan of Care Patient   ? ?  ?  ? ?  ? ? ?Patient will benefit from skilled therapeutic intervention in order to improve the following deficits and impairments:  Decreased range of motion, Pain, Increased muscle spasms, Postural dysfunction, Decreased strength, Decreased mobility, Impaired flexibility, Decreased activity tolerance ? ?Visit Diagnosis: ?Pain in left ankle and joints of left foot ? ?Stiffness of left ankle, not elsewhere classified ? ?Muscle weakness (generalized) ? ?Cramp and spasm ? ? ? ? ?Problem List ?Patient Active Problem List  ? Diagnosis Date Noted  ? Prolapse of female pelvic organs 05/24/2015  ? Ankle sprain 03/06/2011  ? ? ?Artist Pais, PTA ?10/01/2021, 5:02 PM ? ?[place] ?[facility] ?[address] ?[place], [place], [address] ?Phone: [phone]   Fax:  [phone] ? ?Name: Chelsea Vega ?MRN: [id number] ?Date of  Birth: [birth year] ? ? ? ?

## 2021-10-01 NOTE — Patient Instructions (Signed)
Access Code: [email or web address] ?URL: [email or web address] ?Date: 10/01/2021 ?Prepared by: Clarene Essex ? ?Exercises ?Single Leg Heel Raise on Step - 1 x daily - 3-4 x weekly - 3 sets - 10 reps - 3 sec hold ?Gastroc Stretch on Wall - 2 x daily - 7 x weekly - 1 sets - 3 reps - 30-60 sec hold ?Soleus Stretch on Wall - 2 x daily - 7 x weekly - 1 sets - 3 reps - 30-60 sec hold ?Long Sitting Ankle Eversion with Resistance - 1 x daily - 7 x weekly - 3 sets - 10 reps ?Long Sitting Ankle Dorsiflexion with Anchored Resistance - 1 x daily - 7 x weekly - 3 sets - 10 reps ?Long Sitting Ankle Plantar Flexion with Resistance - 1 x daily - 7 x weekly - 3 sets - 10 reps ?Long Sitting Ankle Inversion with Resistance - 1 x daily - 7 x weekly - 3 sets - 10 reps ? ?

## 2021-10-03 ENCOUNTER — Encounter: Payer: 59 | Admitting: Physical Therapy

## 2021-10-04 ENCOUNTER — Ambulatory Visit: Payer: 59

## 2021-10-04 ENCOUNTER — Other Ambulatory Visit: Payer: Self-pay

## 2021-10-04 DIAGNOSIS — M25572 Pain in left ankle and joints of left foot: Secondary | ICD-10-CM | POA: Diagnosis not present

## 2021-10-04 DIAGNOSIS — M25672 Stiffness of left ankle, not elsewhere classified: Secondary | ICD-10-CM

## 2021-10-04 DIAGNOSIS — M6281 Muscle weakness (generalized): Secondary | ICD-10-CM

## 2021-10-04 DIAGNOSIS — R252 Cramp and spasm: Secondary | ICD-10-CM

## 2021-10-04 NOTE — Therapy (Signed)
[place] ?[facility] ?[address] ?[place], [place], [address] ?Phone: [phone]   Fax:  [phone] ? ?Physical Therapy Treatment ? ?Patient Details  ?Name: Chelsea Vega ?MRN: [id number] ?Date of Birth: 12/01/1955 ?Referring Provider (PT): Everlean Alstrom ? ? ?Encounter Date: 10/04/2021 ? ? PT End of Session - 10/04/21 0843   ? ? Visit Number 5   ? Date for PT Re-Evaluation 10/31/21   ? Authorization Type UHC   ? PT Start Time [phone]   ? PT Stop Time [phone]   ? PT Time Calculation (min) 38 min   ? Activity Tolerance Patient tolerated treatment well   ? Behavior During Therapy [facility] for tasks assessed/performed   ? ?  ?  ? ?  ? ? ?Past Medical History:  ?Diagnosis Date  ? Abnormal vaginal Pap smear   ? DISTANT HX- S/P LEEP  ? Allergic rhinitis   ? Anemia   ? pregnancy  ? Asthma   ? childhood, early 20's  ? Constipation   ? Depression   ? Dysrhythmia   ? occassional PVC's  ? GERD (gastroesophageal reflux disease)   ? Hx of cystitis   ? RECURRENT, DR. TANNENBAUM  ? Insomnia   ? Osteoporosis 10/2008  ? DR. Helane Rima, DEXA 10/2008  ? Postmenopausal   ? PVC's (premature ventricular contractions)   ? HX OF  ? Slow heart rate   ? Tinnitus   ? Trapezius muscle spasm   ? Vitamin D deficiency   ? ? ?Past Surgical History:  ?Procedure Laterality Date  ? ANKLE SURGERY    ? ANTERIOR AND POSTERIOR REPAIR N/A 05/24/2015  ? Procedure: ANTERIOR (CYSTOCELE) AND POSTERIOR REPAIR (RECTOCELE);  Surgeon: Dian Queen, MD;  Location: [facility];  Service: Gynecology;  Laterality: N/A;  ? BREAST CYST ASPIRATION    ? 2005  ? DIAGNOSTIC LAPAROSCOPY  07/14/2010  ? removed tubes and ovaries  ? GANGLION CYST EXCISION    ? SEPTOPLASTY    ? VAGINAL HYSTERECTOMY  05/24/2015  ? Procedure: HYSTERECTOMY VAGINAL;  Surgeon: Dian Queen, MD;  Location: [facility];  Service: Gynecology;;  uterus and cervix only  ? ? ?There were no vitals filed for this visit. ? ? Subjective Assessment - 10/04/21 0802   ? ?  Subjective Pt does not feel like she is doing ankle TB exercises right. No pain now but she does get "twinges" every now and again.   ? Pertinent History osteopenia/OP   ? Diagnostic tests MRI 2017:   ? Patient Stated Goals love to get back to running, get rid of pain   ? Currently in Pain? No/denies   ? ?  ?  ? ?  ? ? ? ? ? ? ? ? ? ? ? ? ? ? ? ? ? ? ? ? [facility] Adult PT Treatment/Exercise - 10/04/21 0001   ? ?  ? Knee/Hip Exercises: Standing  ? Hip Flexion Stengthening;Both;10 reps;Knee straight   ? Hip Flexion Limitations counter support; 2# weight   ? Hip ADduction Strengthening;Both;10 reps   ? Hip ADduction Limitations counter support; 2# weight   ? Hip Abduction Stengthening;Both;10 reps;Knee straight   ? Abduction Limitations counter support; 2# weight   ? Hip Extension Stengthening;Both;10 reps;Knee straight   ? Extension Limitations counter support; 2# weight   ? Functional Squat 2 sets;10 reps   ? Functional Squat Limitations 2nd set progressed with 2 sec pause on eccentric   ?  ? Ankle Exercises:  Aerobic  ? Recumbent Bike L3x26mn   ?  ? Ankle Exercises: Seated  ? Other Seated Ankle Exercises L 4 way ankle with GTB, x10 each direction   ?  ? Ankle Exercises: Stretches  ? Gastroc Stretch 2 reps;30 seconds   heel drop off of step  ? ?  ?  ? ?  ? ? ? ? ? ? Balance Exercises - 10/04/21 0001   ? ?  ? Balance Exercises: Standing  ? SLS Eyes open;Foam/compliant surface;3 reps   20, 15, 10 sec each trial  ? Heel Raises Both;10 reps   2 sets; standing on airex  ? Toe Raise Both;10 reps   2 sets; standing on airex  ? ?  ?  ? ?  ? ? ? ? ? ? ? PT Short Term Goals - 10/04/21 0832   ? ?  ? PT SHORT TERM GOAL #1  ? Title Ind with initial HEP   ? Time 2   ? Period Weeks   ? Status Achieved   10/04/21  ? Target Date 10/03/21   ? ?  ?  ? ?  ? ? ? ? PT Long Term Goals - 09/23/21 1113   ? ?  ? PT LONG TERM GOAL #1  ? Title Independent with ongoing HEP   ? Status On-going   ? Target Date 10/31/21   ?  ? PT LONG TERM GOAL #2  ?  Title Improved left gastroc/soleus flexibiility to allow left ankle DF to >= 8 deg   ? Status On-going   ? Target Date 10/31/21   ?  ? PT LONG TERM GOAL #3  ? Title decreased frequency and intensity of left ankle pain by >= 75% with ADLs   ? Status On-going   ? Target Date 10/31/21   ?  ? PT LONG TERM GOAL #4  ? Title improved left ankle strength to 5/5 and no reports of ankle giving way.   ? Status On-going   ? Target Date 10/31/21   ?  ? PT LONG TERM GOAL #5  ? Title -   ? ?  ?  ? ?  ? ? ? ? ? ? ? ? Plan - 10/04/21 0843   ? ? Clinical Impression Statement Pt denies any questions or concerns with initial HEP - STG met. She demonstrated some shakiness with L SLS on airex. Had a tendency to deviate to the medial side of the ankle when balance was lost. Also worked on proximal strengthening for improved stability of the ankles which pt noted good muscle activation and fatigue. Provided some cont'd education on self STM at home to reduce tightness in the lower leg musculature.   ? Personal Factors and Comorbidities Time since onset of injury/illness/exacerbation   ? PT Frequency 2x / week   1-2x per week  ? PT Duration 6 weeks   ? PT Treatment/Interventions ADLs/Self Care Home Management;Cryotherapy;Electrical Stimulation;Iontophoresis 490mml Dexamethasone;Moist Heat;Ultrasound;Neuromuscular re-education;Balance training;Therapeutic exercise;Therapeutic activities;Patient/family education;Manual techniques;Dry needling;Taping   ? PT Next Visit Plan cuboid and talocrural mobs, ankle/foot strengthening and flexibility; HEP - add eversion strength   ? [facility] ? Consulted and Agree with Plan of Care Patient   ? ?  ?  ? ?  ? ? ?Patient will benefit from skilled therapeutic intervention in order to improve the following deficits and impairments:  Decreased range of motion, Pain, Increased muscle spasms, Postural dysfunction, Decreased strength, Decreased mobility, Impaired flexibility, Decreased  activity tolerance ? ?Visit Diagnosis: ?Pain in left ankle and joints of left foot ? ?Stiffness of left ankle, not elsewhere classified ? ?Muscle weakness (generalized) ? ?Cramp and spasm ? ? ? ? ?Problem List ?Patient Active Problem List  ? Diagnosis Date Noted  ? Prolapse of female pelvic organs 05/24/2015  ? Ankle sprain 03/06/2011  ? ? ?Artist Pais, PTA ?10/04/2021, 9:39 AM ? ?[place] ?[facility] ?[address] ?[place], [place], [address] ?Phone: [phone]   Fax:  [phone] ? ?Name: Chelsea Vega ?MRN: [id number] ?Date of Birth: Oct 11, 1955 ? ? ? ?

## 2021-10-07 ENCOUNTER — Other Ambulatory Visit: Payer: Self-pay

## 2021-10-07 ENCOUNTER — Ambulatory Visit: Payer: 59

## 2021-10-07 DIAGNOSIS — M25572 Pain in left ankle and joints of left foot: Secondary | ICD-10-CM

## 2021-10-07 DIAGNOSIS — M25672 Stiffness of left ankle, not elsewhere classified: Secondary | ICD-10-CM

## 2021-10-07 DIAGNOSIS — M6281 Muscle weakness (generalized): Secondary | ICD-10-CM

## 2021-10-07 DIAGNOSIS — R252 Cramp and spasm: Secondary | ICD-10-CM

## 2021-10-07 NOTE — Therapy (Signed)
[place] ?[facility] ?[address] ?[place], [place], [address] ?Phone: [phone]   Fax:  [phone] ? ?Physical Therapy Treatment ? ?Patient Details  ?Name: Chelsea Vega ?MRN: [id number] ?Date of Birth: 04/22/1956 ?Referring Provider (PT): Everlean Alstrom ? ? ?Encounter Date: 10/07/2021 ? ? PT End of Session - 10/07/21 1102   ? ? Visit Number 6   ? Date for PT Re-Evaluation 10/31/21   ? Authorization Type UHC   ? PT Start Time 1019   ? PT Stop Time 1059   ? PT Time Calculation (min) 40 min   ? Activity Tolerance Patient tolerated treatment well   ? Behavior During Therapy [facility] for tasks assessed/performed   ? ?  ?  ? ?  ? ? ?Past Medical History:  ?Diagnosis Date  ? Abnormal vaginal Pap smear   ? DISTANT HX- S/P LEEP  ? Allergic rhinitis   ? Anemia   ? pregnancy  ? Asthma   ? childhood, early 20's  ? Constipation   ? Depression   ? Dysrhythmia   ? occassional PVC's  ? GERD (gastroesophageal reflux disease)   ? Hx of cystitis   ? RECURRENT, DR. TANNENBAUM  ? Insomnia   ? Osteoporosis 10/2008  ? DR. Helane Rima, DEXA 10/2008  ? Postmenopausal   ? PVC's (premature ventricular contractions)   ? HX OF  ? Slow heart rate   ? Tinnitus   ? Trapezius muscle spasm   ? Vitamin D deficiency   ? ? ?Past Surgical History:  ?Procedure Laterality Date  ? ANKLE SURGERY    ? ANTERIOR AND POSTERIOR REPAIR N/A 05/24/2015  ? Procedure: ANTERIOR (CYSTOCELE) AND POSTERIOR REPAIR (RECTOCELE);  Surgeon: Dian Queen, MD;  Location: [facility];  Service: Gynecology;  Laterality: N/A;  ? BREAST CYST ASPIRATION    ? 2005  ? DIAGNOSTIC LAPAROSCOPY  07/14/2010  ? removed tubes and ovaries  ? GANGLION CYST EXCISION    ? SEPTOPLASTY    ? VAGINAL HYSTERECTOMY  05/24/2015  ? Procedure: HYSTERECTOMY VAGINAL;  Surgeon: Dian Queen, MD;  Location: [facility];  Service: Gynecology;;  uterus and cervix only  ? ? ?There were no vitals filed for this visit. ? ? Subjective Assessment - 10/07/21 1018   ? ?  Subjective Last night had some shooting pain along the medial side of the ankle into the arch.   ? Pertinent History osteopenia/OP   ? Diagnostic tests MRI 2017:   ? Patient Stated Goals love to get back to running, get rid of pain   ? Currently in Pain? No/denies   ? ?  ?  ? ?  ? ? ? ? ? ? ? ? ? ? ? ? ? ? ? ? ? ? ? ? [facility] Adult PT Treatment/Exercise - 10/07/21 0001   ? ?  ? Knee/Hip Exercises: Standing  ? Other Standing Knee Exercises 3 way stepping (fwd, lateral both side) x 5 reps R/L   ?  ? Ankle Exercises: Stretches  ? Gastroc Stretch 2 reps;30 seconds   seated with towel  ?  ? Ankle Exercises: Aerobic  ? Recumbent Bike L3x75mn   ?  ? Ankle Exercises: Seated  ? ABC's 2 reps   2# weight at distal foot  ? Other Seated Ankle Exercises L ankle EV and IV with GTB x 10 each   ? ?  ?  ? ?  ? ? ? ? ? ? Balance Exercises - 10/07/21 0001   ? ?  ?  Balance Exercises: Standing  ? SLS Eyes open;Foam/compliant surface;2 reps   20 sec, 30 sec  ? Heel Raises Both;10 reps   2 sets on airex  ? Toe Raise Both;10 reps   2 sets on airex  ? Other Standing Exercises L runner step up on ariex pad 2x10   ? ?  ?  ? ?  ? ? ? ? ? PT Education - 10/07/21 1101   ? ? Education Details SLS added to HEP   ? Person(s) Educated Patient   ? Methods Explanation;Demonstration;Handout   ? Comprehension Verbalized understanding;Returned demonstration   ? ?  ?  ? ?  ? ? ? PT Short Term Goals - 10/04/21 0832   ? ?  ? PT SHORT TERM GOAL #1  ? Title Ind with initial HEP   ? Time 2   ? Period Weeks   ? Status Achieved   10/04/21  ? Target Date 10/03/21   ? ?  ?  ? ?  ? ? ? ? PT Long Term Goals - 09/23/21 1113   ? ?  ? PT LONG TERM GOAL #1  ? Title Independent with ongoing HEP   ? Status On-going   ? Target Date 10/31/21   ?  ? PT LONG TERM GOAL #2  ? Title Improved left gastroc/soleus flexibiility to allow left ankle DF to >= 8 deg   ? Status On-going   ? Target Date 10/31/21   ?  ? PT LONG TERM GOAL #3  ? Title decreased frequency and intensity of left  ankle pain by >= 75% with ADLs   ? Status On-going   ? Target Date 10/31/21   ?  ? PT LONG TERM GOAL #4  ? Title improved left ankle strength to 5/5 and no reports of ankle giving way.   ? Status On-going   ? Target Date 10/31/21   ?  ? PT LONG TERM GOAL #5  ? Title -   ? ?  ?  ? ?  ? ? ? ? ? ? ? ? Plan - 10/07/21 1102   ? ? Clinical Impression Statement Pt reported occasional sharp pains along the medial portion of her ankle with quick stepping motions, thus we worked on stepping strategies and WS onto the L LE to improve stability and reduce sharp pains with these quick reactions. Pt showed better stability today with L SLS so we progressed to runner step up on airex. Also progressed isolated ankle strengthening. Pt responded well to treatment today.   ? Personal Factors and Comorbidities Time since onset of injury/illness/exacerbation   ? PT Frequency 2x / week   1-2x per week  ? PT Duration 6 weeks   ? PT Treatment/Interventions ADLs/Self Care Home Management;Cryotherapy;Electrical Stimulation;Iontophoresis '4mg'$ /ml Dexamethasone;Moist Heat;Ultrasound;Neuromuscular re-education;Balance training;Therapeutic exercise;Therapeutic activities;Patient/family education;Manual techniques;Dry needling;Taping   ? PT Next Visit Plan cuboid and talocrural mobs, ankle/foot strengthening and flexibility;  ? PT Home Exercise Plan [facility]   ? Consulted and Agree with Plan of Care Patient   ? ?  ?  ? ?  ? ? ?Patient will benefit from skilled therapeutic intervention in order to improve the following deficits and impairments:  Decreased range of motion, Pain, Increased muscle spasms, Postural dysfunction, Decreased strength, Decreased mobility, Impaired flexibility, Decreased activity tolerance ? ?Visit Diagnosis: ?Pain in left ankle and joints of left foot ? ?Stiffness of left ankle, not elsewhere classified ? ?Muscle weakness (generalized) ? ?Cramp and spasm ? ? ? ? ?Problem List ?  Patient Active Problem List  ? Diagnosis Date  Noted  ? Prolapse of female pelvic organs 05/24/2015  ? Ankle sprain 03/06/2011  ? ? ?Artist Pais, PTA ?10/07/2021, 11:58 AM ? ?[place] ?[facility] ?[address] ?[place], [place], [address] ?Phone: [phone]   Fax:  [phone] ? ?Name: Chelsea Vega ?MRN: [id number] ?Date of Birth: 07/30/1955 ? ? ? ?

## 2021-10-07 NOTE — Patient Instructions (Signed)
Access Code: [email or web address] ?URL: [email or web address] ?Date: 10/07/2021 ?Prepared by: Clarene Essex ? ?Exercises ?- Single Leg Heel Raise on Step  - 1 x daily - 3-4 x weekly - 3 sets - 10 reps - 3 sec hold ?- Gastroc Stretch on Wall  - 2 x daily - 7 x weekly - 1 sets - 3 reps - 30-60 sec hold ?- Soleus Stretch on Wall  - 2 x daily - 7 x weekly - 1 sets - 3 reps - 30-60 sec hold ?- Long Sitting Ankle Eversion with Resistance  - 1 x daily - 7 x weekly - 3 sets - 10 reps ?- Long Sitting Ankle Dorsiflexion with Anchored Resistance  - 1 x daily - 7 x weekly - 3 sets - 10 reps ?- Long Sitting Ankle Plantar Flexion with Resistance  - 1 x daily - 7 x weekly - 3 sets - 10 reps ?- Long Sitting Ankle Inversion with Resistance  - 1 x daily - 7 x weekly - 3 sets - 10 reps ?- Single Leg Stance on Foam Pad  - 2 x daily - 7 x weekly - 2 sets - 2 reps - 15-30 second hold ?

## 2021-10-10 ENCOUNTER — Encounter: Payer: Self-pay | Admitting: Physical Therapy

## 2021-10-10 ENCOUNTER — Ambulatory Visit: Payer: 59 | Admitting: Physical Therapy

## 2021-10-10 DIAGNOSIS — M25572 Pain in left ankle and joints of left foot: Secondary | ICD-10-CM | POA: Diagnosis not present

## 2021-10-10 DIAGNOSIS — M6281 Muscle weakness (generalized): Secondary | ICD-10-CM

## 2021-10-10 DIAGNOSIS — M25672 Stiffness of left ankle, not elsewhere classified: Secondary | ICD-10-CM

## 2021-10-10 DIAGNOSIS — R252 Cramp and spasm: Secondary | ICD-10-CM

## 2021-10-10 NOTE — Therapy (Signed)
[place] [facility] ?[address] ?[place], [place], [address] ?Phone: [phone]   Fax:  [phone] ? ?Physical Therapy Treatment ? ?Patient Details  ?Name: Chelsea Vega ?MRN: [id number] ?Date of Birth: 30-Jul-1955 ?Referring Provider (PT): Everlean Alstrom ? ? ?Encounter Date: 10/10/2021 ? ? PT End of Session - 10/10/21 1032   ? ? Visit Number 7   ? Date for PT Re-Evaluation 10/31/21   ? Authorization Type UHC   ? PT Start Time 1030   ? PT Stop Time 1106   ? PT Time Calculation (min) 36 min   ? Activity Tolerance Patient tolerated treatment well   ? Behavior During Therapy [facility] for tasks assessed/performed   ? ?  ?  ? ?  ? ? ?Past Medical History:  ?Diagnosis Date  ? Abnormal vaginal Pap smear   ? DISTANT HX- S/P LEEP  ? Allergic rhinitis   ? Anemia   ? pregnancy  ? Asthma   ? childhood, early 20's  ? Constipation   ? Depression   ? Dysrhythmia   ? occassional PVC's  ? GERD (gastroesophageal reflux disease)   ? Hx of cystitis   ? RECURRENT, DR. TANNENBAUM  ? Insomnia   ? Osteoporosis 10/2008  ? DR. Helane Rima, DEXA 10/2008  ? Postmenopausal   ? PVC's (premature ventricular contractions)   ? HX OF  ? Slow heart rate   ? Tinnitus   ? Trapezius muscle spasm   ? Vitamin D deficiency   ? ? ?Past Surgical History:  ?Procedure Laterality Date  ? ANKLE SURGERY    ? ANTERIOR AND POSTERIOR REPAIR N/A 05/24/2015  ? Procedure: ANTERIOR (CYSTOCELE) AND POSTERIOR REPAIR (RECTOCELE);  Surgeon: Dian Queen, MD;  Location: [facility];  Service: Gynecology;  Laterality: N/A;  ? BREAST CYST ASPIRATION    ? 2005  ? DIAGNOSTIC LAPAROSCOPY  07/14/2010  ? removed tubes and ovaries  ? GANGLION CYST EXCISION    ? SEPTOPLASTY    ? VAGINAL HYSTERECTOMY  05/24/2015  ? Procedure: HYSTERECTOMY VAGINAL;  Surgeon: Dian Queen, MD;  Location: [facility];  Service: Gynecology;;  uterus and cervix only  ? ? ?There were no vitals filed for this visit. ? ? Subjective Assessment - 10/10/21 1031   ? ?  Subjective Repeated pain shooting on lateral side of foot last night, something that happens maybe once a month, ached afterwards 6-7/10.   ? Pertinent History osteopenia/OP   ? Diagnostic tests MRI 2017:   ? Patient Stated Goals love to get back to running, get rid of pain   ? Currently in Pain? Yes   ? Pain Score 2    ? Pain Location Ankle   ? Pain Orientation Left   ? ?  ?  ? ?  ? ? ? ? ? ? ? ? ? ? ? ? ? ? ? ? ? ? ? ? [facility] Adult PT Treatment/Exercise - 10/10/21 0001   ? ?  ? Exercises  ? Exercises Ankle   ?  ? Manual Therapy  ? Manual Therapy Myofascial release;Other (comment)   ? Manual therapy comments to L leg to decrease L ankle pain   ? Myofascial Release IASTM with s/s tools to L ant tib and L fibularis muscles   ? Other Manual Therapy skilled palpation and monitoring during dry needling.   ?  ? Ankle Exercises: Aerobic  ? Recumbent Bike L2 x 4 min   ?  ? Ankle Exercises:  Standing  ? Heel Raises 20 reps;Both   ? Heel Raises Limitations 2 x 10 on step   ? Toe Raise 20 reps   ? Toe Raise Limitations leaning against wall 2 x 10   ?  ? Ankle Exercises: Seated  ? BAPS Sitting;Level 2;Limitations   ? BAPS Limitations x20 Inv/ever, x 20 DF/PF, x 20 CW, x 20 CCW   ? ?  ?  ? ?  ? ? ? Trigger Point Dry Needling - 10/10/21 0001   ? ? Consent Given? Yes   ? Education Handout Provided Previously provided   ? Muscles Treated Lower Quadrant Anterior tibialis;Peroneals   ? Dry Needling Comments left   ? Peroneals Response Twitch response elicited;Palpable increased muscle length   ? Anterior tibialis Response Twitch response elicited   ? ?  ?  ? ?  ? ? ? ? ? ? ? ? ? ? PT Short Term Goals - 10/04/21 0832   ? ?  ? PT SHORT TERM GOAL #1  ? Title Ind with initial HEP   ? Time 2   ? Period Weeks   ? Status Achieved   10/04/21  ? Target Date 10/03/21   ? ?  ?  ? ?  ? ? ? ? PT Long Term Goals - 09/23/21 1113   ? ?  ? PT LONG TERM GOAL #1  ? Title Independent with ongoing HEP   ? Status On-going   ? Target Date 10/31/21   ?  ? PT  LONG TERM GOAL #2  ? Title Improved left gastroc/soleus flexibiility to allow left ankle DF to >= 8 deg   ? Status On-going   ? Target Date 10/31/21   ?  ? PT LONG TERM GOAL #3  ? Title decreased frequency and intensity of left ankle pain by >= 75% with ADLs   ? Status On-going   ? Target Date 10/31/21   ?  ? PT LONG TERM GOAL #4  ? Title improved left ankle strength to 5/5 and no reports of ankle giving way.   ? Status On-going   ? Target Date 10/31/21   ?  ? PT LONG TERM GOAL #5  ? Title -   ? ?  ?  ? ?  ? ? ? ? ? ? ? ? Plan - 10/10/21 1203   ? ? Clinical Impression Statement Patient caught in traffic and arrived 10 min late today.  She reports pain on lateral side L ankle today.  She also reports difficulty with eccentric heel raises.  Reviewed heel raises and to perform double leg heel raises until strong enough to progress to single leg heel raises.  Also added toe raises for ant tib strengthening.  Noted improved inv/ever and ankle proprioception with BAPs board today, ready to progress to level 3.  Noted trigger points in ant tib and fibularis muscles today with manual therapy, she consented to dry needling in this area today, reported decreased tightness following interventions.   ? Personal Factors and Comorbidities Time since onset of injury/illness/exacerbation   ? PT Frequency 2x / week   1-2x per week  ? PT Duration 6 weeks   ? PT Treatment/Interventions ADLs/Self Care Home Management;Cryotherapy;Electrical Stimulation;Iontophoresis '4mg'$ /ml Dexamethasone;Moist Heat;Ultrasound;Neuromuscular re-education;Balance training;Therapeutic exercise;Therapeutic activities;Patient/family education;Manual techniques;Dry needling;Taping   ? PT Next Visit Plan cuboid and talocrural mobs, ankle/foot strengthening and flexibility; HEP - add eversion strength   ? PT Home Exercise Plan [facility]   ? Consulted and Agree with  Plan of Care Patient   ? ?  ?  ? ?  ? ? ?Patient will benefit from skilled therapeutic intervention  in order to improve the following deficits and impairments:  Decreased range of motion, Pain, Increased muscle spasms, Postural dysfunction, Decreased strength, Decreased mobility, Impaired flexibility, Decreased activity tolerance ? ?Visit Diagnosis: ?Pain in left ankle and joints of left foot ? ?Stiffness of left ankle, not elsewhere classified ? ?Muscle weakness (generalized) ? ?Cramp and spasm ? ? ? ? ?Problem List ?Patient Active Problem List  ? Diagnosis Date Noted  ? Prolapse of female pelvic organs 05/24/2015  ? Ankle sprain 03/06/2011  ? ? ?Rennie Natter, PT, DPT  ?10/10/2021, 12:10 PM ? ?[place] [facility] ?[place] ?[place], [place], [address] ?Phone: [phone]   Fax:  [phone] ? ?Name: Chelsea Vega ?MRN: [id number] ?Date of Birth: Oct 14, 1955 ? ? ? ?

## 2021-10-14 ENCOUNTER — Ambulatory Visit: Payer: 59 | Attending: Sports Medicine

## 2021-10-14 DIAGNOSIS — M25672 Stiffness of left ankle, not elsewhere classified: Secondary | ICD-10-CM | POA: Diagnosis present

## 2021-10-14 DIAGNOSIS — M25572 Pain in left ankle and joints of left foot: Secondary | ICD-10-CM | POA: Insufficient documentation

## 2021-10-14 DIAGNOSIS — R252 Cramp and spasm: Secondary | ICD-10-CM | POA: Insufficient documentation

## 2021-10-14 DIAGNOSIS — M6281 Muscle weakness (generalized): Secondary | ICD-10-CM | POA: Diagnosis present

## 2021-10-14 NOTE — Therapy (Signed)
[place] ?[facility] ?[place] ?[place], [place], [address] ?Phone: [phone]   Fax:  [phone] ? ?Physical Therapy Treatment ? ?Patient Details  ?Name: Chelsea Vega ?MRN: [id number] ?Date of Birth: October 23, 1955 ?Referring Provider (PT): Everlean Alstrom ? ? ?Encounter Date: 10/14/2021 ? ? PT End of Session - 10/14/21 0930   ? ? Visit Number 8   ? Date for PT Re-Evaluation 10/31/21   ? Authorization Type UHC   ? PT Start Time [phone]   pt late  ? PT Stop Time 0930   ? PT Time Calculation (min) 38 min   ? Activity Tolerance Patient tolerated treatment well   ? Behavior During Therapy [facility] for tasks assessed/performed   ? ?  ?  ? ?  ? ? ?Past Medical History:  ?Diagnosis Date  ? Abnormal vaginal Pap smear   ? DISTANT HX- S/P LEEP  ? Allergic rhinitis   ? Anemia   ? pregnancy  ? Asthma   ? childhood, early 20's  ? Constipation   ? Depression   ? Dysrhythmia   ? occassional PVC's  ? GERD (gastroesophageal reflux disease)   ? Hx of cystitis   ? RECURRENT, DR. TANNENBAUM  ? Insomnia   ? Osteoporosis 10/2008  ? DR. Helane Rima, DEXA 10/2008  ? Postmenopausal   ? PVC's (premature ventricular contractions)   ? HX OF  ? Slow heart rate   ? Tinnitus   ? Trapezius muscle spasm   ? Vitamin D deficiency   ? ? ?Past Surgical History:  ?Procedure Laterality Date  ? ANKLE SURGERY    ? ANTERIOR AND POSTERIOR REPAIR N/A 05/24/2015  ? Procedure: ANTERIOR (CYSTOCELE) AND POSTERIOR REPAIR (RECTOCELE);  Surgeon: Dian Queen, MD;  Location: [facility];  Service: Gynecology;  Laterality: N/A;  ? BREAST CYST ASPIRATION    ? 2005  ? DIAGNOSTIC LAPAROSCOPY  07/14/2010  ? removed tubes and ovaries  ? GANGLION CYST EXCISION    ? SEPTOPLASTY    ? VAGINAL HYSTERECTOMY  05/24/2015  ? Procedure: HYSTERECTOMY VAGINAL;  Surgeon: Dian Queen, MD;  Location: [facility];  Service: Gynecology;;  uterus and cervix only  ? ? ?There were no vitals filed for this visit. ? ? Subjective Assessment - 10/14/21 0852   ? ?  Subjective "Not really noting any pain right now, felt a little earlier but good now."   ? Pertinent History osteopenia/OP   ? Diagnostic tests MRI 2017:   ? Patient Stated Goals love to get back to running, get rid of pain   ? Currently in Pain? No/denies   ? ?  ?  ? ?  ? ? ? ? ? ? ? ? ? ? ? ? ? ? ? ? ? ? ? ? [facility] Adult PT Treatment/Exercise - 10/14/21 0001   ? ?  ? Ankle Exercises: Aerobic  ? Recumbent Bike L4x27mn   ?  ? Ankle Exercises: Seated  ? Heel Raises Both;10 reps;3 seconds   from 8 inch step; 3x10  ? BAPS Sitting;Level 3   ? BAPS Limitations x 20 DF/PF, x 20 CW, x 20 CCW   ? Other Seated Ankle Exercises L toe curls with GTB 2x10   ? Other Seated Ankle Exercises L EV/IV with GTB 2x10 each   ?  ? Ankle Exercises: Machines for Strengthening  ? Cybex Leg Press 20lb 2x10 leg press; 30lb 2x10 calf press   ?  ? Ankle Exercises: Stretches  ?  Gastroc Stretch 1 rep;30 seconds   with prostretch  ? ?  ?  ? ?  ? ? ? ? ? ? ? ? ? ? ? ? PT Short Term Goals - 10/04/21 0832   ? ?  ? PT SHORT TERM GOAL #1  ? Title Ind with initial HEP   ? Time 2   ? Period Weeks   ? Status Achieved   10/04/21  ? Target Date 10/03/21   ? ?  ?  ? ?  ? ? ? ? PT Long Term Goals - 10/14/21 0915   ? ?  ? PT LONG TERM GOAL #1  ? Title Independent with ongoing HEP   ? Status On-going   ? Target Date 10/31/21   ?  ? PT LONG TERM GOAL #2  ? Title Improved left gastroc/soleus flexibiility to allow left ankle DF to >= 8 deg   ? Status On-going   ? Target Date 10/31/21   ?  ? PT LONG TERM GOAL #3  ? Title decreased frequency and intensity of left ankle pain by >= 75% with ADLs   ? Status On-going   pt reports 70% improvement, only on occassion she "tweaks" her ankle when making quick jerky movements  ? Target Date 10/31/21   ?  ? PT LONG TERM GOAL #4  ? Title improved left ankle strength to 5/5 and no reports of ankle giving way.   ? Status On-going   ? Target Date 10/31/21   ?  ? PT LONG TERM GOAL #5  ? Title -   ? ?  ?  ? ?  ? ? ? ? ? ? ? ? Plan -  10/14/21 0933   ? ? Clinical Impression Statement Pt responded well to treatment. She had no pain coming in or with any interventions. Progressed BAPS board to level 3, only a challenge noted from pt. She noted 70% improvement in L ankle pain with ADLs now but occasionally gets sharp pain when making a sudden movement. Pt would continue to benefit from ankle strengthening and proprioceptive exercises to improve daily tolerance for activity.   ? Personal Factors and Comorbidities Time since onset of injury/illness/exacerbation   ? PT Frequency 2x / week   1-2x per week  ? PT Duration 6 weeks   ? PT Treatment/Interventions ADLs/Self Care Home Management;Cryotherapy;Electrical Stimulation;Iontophoresis '4mg'$ /ml Dexamethasone;Moist Heat;Ultrasound;Neuromuscular re-education;Balance training;Therapeutic exercise;Therapeutic activities;Patient/family education;Manual techniques;Dry needling;Taping   ? PT Next Visit Plan cuboid and talocrural mobs, ankle/foot strengthening and flexibility; HEP - add eversion strength   ? PT Home Exercise Plan [facility]   ? Consulted and Agree with Plan of Care Patient   ? ?  ?  ? ?  ? ? ?Patient will benefit from skilled therapeutic intervention in order to improve the following deficits and impairments:  Decreased range of motion, Pain, Increased muscle spasms, Postural dysfunction, Decreased strength, Decreased mobility, Impaired flexibility, Decreased activity tolerance ? ?Visit Diagnosis: ?Pain in left ankle and joints of left foot ? ?Stiffness of left ankle, not elsewhere classified ? ?Muscle weakness (generalized) ? ?Cramp and spasm ? ? ? ? ?Problem List ?Patient Active Problem List  ? Diagnosis Date Noted  ? Prolapse of female pelvic organs 05/24/2015  ? Ankle sprain 03/06/2011  ? ? ?Artist Pais, PTA ?10/14/2021, 10:07 AM ? ?[place] ?[facility] ?[place] ?[place], [place], [address] ?Phone: [phone]   Fax:   [phone] ? ?Name: Jeanni Allshouse  [place] ?MRN: [id number] ?Date of Birth: 04-13-1956 ? ? ? ?

## 2021-10-21 ENCOUNTER — Encounter: Payer: Self-pay | Admitting: Physical Therapy

## 2021-10-21 ENCOUNTER — Ambulatory Visit: Payer: 59 | Admitting: Physical Therapy

## 2021-10-21 DIAGNOSIS — M25572 Pain in left ankle and joints of left foot: Secondary | ICD-10-CM

## 2021-10-21 DIAGNOSIS — M6281 Muscle weakness (generalized): Secondary | ICD-10-CM

## 2021-10-21 DIAGNOSIS — M25672 Stiffness of left ankle, not elsewhere classified: Secondary | ICD-10-CM

## 2021-10-21 DIAGNOSIS — R252 Cramp and spasm: Secondary | ICD-10-CM

## 2021-10-21 NOTE — Therapy (Addendum)
PHYSICAL THERAPY DISCHARGE SUMMARY  Visits from Start of Care: 9  Current functional level related to goals / functional outcomes: From  note below "Patient has made good progress, has returned to walking, and reports 70-75% improvement overall.  She only gets "twinges" now and then.  Her L ankle ROM has improved as has ankle strength, meeting LTG #1 & #2 and almost meeting LTG # 3 & 4.  Today focused on progressing HEP for more dynamic SLS activities/lunges for strengthening, and dry needling to L soleus where still feels tightness end of session.  Based on progress, placing on 30 day hold today."   Remaining deficits: See below   Education / Equipment: HEP  Plan: Patient agrees to discharge.  Patient is being discharged due to meeting the stated rehab goals.  Patient was placed on 30 day hold on 10/21/2021 and has not returned within stated time.     Rennie Natter, PT, DPT 3:25 PM, 12/16/2021        [facility] [address]  [place], [place], [address] Phone: [phone]   Fax:  [phone]  Physical Therapy Treatment  Progress Note Reporting Period 09/19/2021 to 10/21/2021  See note below for Objective Data and Assessment of Progress/Goals.   Patient Details  Name: Chelsea Vega MRN: [id number] Date of Birth: [birth year] Referring Provider (PT): Everlean Alstrom   Encounter Date: 10/21/2021   PT End of Session - 10/21/21 0851     Visit Number 9    Date for PT Re-Evaluation 10/31/21    Authorization Type UHC    PT Start Time 6333    PT Stop Time 0935    PT Time Calculation (min) 48 min             Past Medical History:  Diagnosis Date   Abnormal vaginal Pap smear    DISTANT HX- S/P LEEP   Allergic rhinitis    Anemia    pregnancy   Asthma    childhood, early 20's   Constipation    Depression    Dysrhythmia    occassional PVC's   GERD (gastroesophageal reflux disease)    Hx of cystitis     RECURRENT, DR. TANNENBAUM   Insomnia    Osteoporosis 10/2008   DR. [place], [place] 10/2008   Postmenopausal    PVC's (premature ventricular contractions)    HX OF   Slow heart rate    Tinnitus    Trapezius muscle spasm    Vitamin D deficiency     Past Surgical History:  Procedure Laterality Date   ANKLE SURGERY     ANTERIOR AND POSTERIOR REPAIR N/A 05/24/2015   Procedure: ANTERIOR (CYSTOCELE) AND POSTERIOR REPAIR (RECTOCELE);  Surgeon: Dian Queen, MD;  Location: [facility];  Service: Gynecology;  Laterality: N/A;   BREAST CYST ASPIRATION     2005   DIAGNOSTIC LAPAROSCOPY  07/14/2010   removed tubes and ovaries   GANGLION CYST EXCISION     SEPTOPLASTY     VAGINAL HYSTERECTOMY  05/24/2015   Procedure: HYSTERECTOMY VAGINAL;  Surgeon: Dian Queen, MD;  Location: [facility];  Service: Gynecology;;  uterus and cervix only    There were no vitals filed for this visit.   Subjective Assessment - 10/21/21 0850     Subjective Ankle is doing well, a little "twinge" every once in a while.    Pertinent History osteopenia/OP    Diagnostic tests MRI 2017:    Patient Stated  Goals love to get back to running, get rid of pain    Currently in Pain? No/denies                [facility] PT Assessment - 10/21/21 0001       Assessment   Medical Diagnosis chronic left ankle pain    Referring Provider (PT) Everlean Alstrom    Onset Date/Surgical Date 12/27/15    Hand Dominance Right    Next MD Visit as needed      Observation/Other Assessments   Focus on Therapeutic Outcomes (FOTO)  79 - not significantly different from IE      AROM   Left Ankle Dorsiflexion 8      PROM   Left Ankle Dorsiflexion 10                           [facility] Adult PT Treatment/Exercise - 10/21/21 0001       Knee/Hip Exercises: Standing   Heel Raises Both;3 sets;10 reps    Heel Raises Limitations in lunge position    Forward Lunges Right;Left;2 sets;10 reps    Forward Lunges Limitations other foot  going back on slider for eccentric strengthening, also backwards lunge 2 x 10 bil with foot on slider    Side Lunges 2 sets;10 reps    Side Lunges Limitations with other foot on slider    SLS x 1 min L, x 45 sec R      Manual Therapy   Manual Therapy Myofascial release;Other (comment)    Manual therapy comments to L leg to decrease L ankle pain    Myofascial Release STM/TPR to L gastroc/soleus    Other Manual Therapy skilled palpation and monitoring during dry needling.      Ankle Exercises: Seated   BAPS Sitting;Level 3    BAPS Limitations x 20 DF/PF, x 20 CW, x 20 CCW      Ankle Exercises: Aerobic   Recumbent Bike L4x19mn              Trigger Point Dry Needling - 10/21/21 0001     Consent Given? Yes    Education Handout Provided Previously provided    Muscles Treated Lower Quadrant Soleus    Dry Needling Comments left    Soleus Response Twitch response elicited;Palpable increased muscle length                     PT Short Term Goals - 10/04/21 0832       PT SHORT TERM GOAL #1   Title Ind with initial HEP    Time 2    Period Weeks    Status Achieved   10/04/21   Target Date 10/03/21               PT Long Term Goals - 10/21/21 0855       PT LONG TERM GOAL #1   Title Independent with ongoing HEP    Status Achieved   10/21/21- met for current.   Target Date 10/31/21      PT LONG TERM GOAL #2   Title Improved left gastroc/soleus flexibiility to allow left ankle DF to >= 8 deg    Status Achieved   10/21/21- 10 deg DF   Target Date 10/31/21      PT LONG TERM GOAL #3   Title decreased frequency and intensity of left ankle pain by >= 75% with ADLs    Status Partially  Met   pt reports 70% improvement, only on occassion she "tweaks" her ankle when making quick jerky movements   Target Date 10/31/21      PT LONG TERM GOAL #4   Title improved left ankle strength to 5/5 and no reports of ankle giving way.    Status Partially Met   10/21/21- 4+/5 ankle  Ever, Inv without pain   Target Date 10/31/21      PT LONG TERM GOAL #5   Title -                   Plan - 10/21/21 0940     Clinical Impression Statement Patient has made good progress, has returned to walking, and reports 70-75% improvement overall.  She only gets "twinges" now and then.  Her L ankle ROM has improved as has ankle strength, meeting LTG #1 & #2 and almost meeting LTG # 3 & 4.  Today focused on progressing HEP for more dynamic SLS activities/lunges for strengthening, and dry needling to L soleus where still feels tightness end of session.  Based on progress, placing on 30 day hold today.    Personal Factors and Comorbidities Time since onset of injury/illness/exacerbation    PT Frequency 2x / week   1-2x per week   PT Duration 6 weeks    PT Treatment/Interventions ADLs/Self Care Home Management;Cryotherapy;Electrical Stimulation;Iontophoresis 15m/ml Dexamethasone;Moist Heat;Ultrasound;Neuromuscular re-education;Balance training;Therapeutic exercise;Therapeutic activities;Patient/family education;Manual techniques;Dry needling;Taping    PT Next Visit Plan cuboid and talocrural mobs, ankle/foot strengthening and flexibility; HEP - add eversion strength    PT Home Exercise Plan [email or web address]    Consulted and Agree with Plan of Care Patient             Patient will benefit from skilled therapeutic intervention in order to improve the following deficits and impairments:  Decreased range of motion, Pain, Increased muscle spasms, Postural dysfunction, Decreased strength, Decreased mobility, Impaired flexibility, Decreased activity tolerance  Visit Diagnosis: Pain in left ankle and joints of left foot  Stiffness of left ankle, not elsewhere classified  Muscle weakness (generalized)  Cramp and spasm     Problem List Patient Active Problem List   Diagnosis Date Noted   Prolapse of 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 [place] [address]Phone: [phone]  Fax:  [phone] Name: Chelsea BONHAMMRN: [id number] of Birth: 5December 02, 1957

## 2021-10-28 ENCOUNTER — Encounter: Payer: 59 | Admitting: Physical Therapy

## 2021-12-12 HISTORY — PX: BLEPHAROPLASTY: SUR158

## 2022-09-09 NOTE — Progress Notes (Unsigned)
NEW PATIENT Date of Service/Encounter:  09/11/22 Referring provider: Orpah Melter, MD Primary care provider: Orpah Melter, MD  Subjective:  Chelsea Vega is a 67 y.o. female presenting today for evaluation of rash. History obtained from: chart review and patient.   Rash: started October 2023, recently occurring daily, Initially started on her back as a patch, prescribed triamcinolone and then her scalp started getting itchy.  She was using Nizoral lotion on her scalp, then rash turned into hives. Associates itching Denies other systemic symptoms including no respiratory, gastrointestinal or cardiovascular distress. No changes to personal care products, detergents, diet, medications etc.  She did start Lexapro - however this was one month after initial rash. Therapies tried: triamcinolone cream, Nizoral lotion, zyrtec 1 tablets in AM zyrtec in the afternoon and benadryl 1-2 tablets in PM, ice packs. Benadryl does not make her sleepy-she has to take ambien to go to sleep.  Potential triggers: none, does seem to get worse in evening and at night. Sometimes notices more rash when she is more active. Does not associate fever  joint swelling, weight loss. Occasionally has some wrist pain on right side but she did break that ankle in July.  Lesions resolve in hours.  She does have some bruising on resolution from scratching.  She did have a viral illness in October. Rash started within a week of this illness. She was treated with antibiotics for the illness.  Also given a steroid pack which she did not take.  Pictures of rash reviewed and consistent with urticaria.    Reviewed dermatology referral notes-patient with urticaria, using high-dose AH and topical steroids without relief. No obvious triggers.   Had hay fever as a child and was on chlorpheniramine when she was younger. Now symptoms much milder.  She will take alternating claritin or zyrtec as needed for these symptoms.  Other  allergy screening: Asthma: no Food allergy: no Medication allergy: no Eczema:no  Past Medical History: Past Medical History:  Diagnosis Date   Abnormal vaginal Pap smear    DISTANT HX- S/P LEEP   Allergic rhinitis    Anemia    pregnancy   Asthma    childhood, early 20's   Constipation    Depression    Dysrhythmia    occassional PVC's   GERD (gastroesophageal reflux disease)    Hx of cystitis    RECURRENT, DR. TANNENBAUM   Insomnia    Osteoporosis 10/2008   DR. GREWAL, DEXA 10/2008   Postmenopausal    PVC's (premature ventricular contractions)    HX OF   Slow heart rate    Tinnitus    Trapezius muscle spasm    Vitamin D deficiency    Medication List:  Current Outpatient Medications  Medication Sig Dispense Refill   Calcium Carbonate (CALTRATE 600 PO) Take 1 tablet by mouth daily.       Cholecalciferol 250 MCG (10000 UT) CAPS Take by mouth.     escitalopram (LEXAPRO) 10 MG tablet Take 10 mg by mouth daily.     Estradiol 0.52 MG/0.87 GM (0.06%) GEL Place 1 application onto the skin daily.      fluticasone (FLONASE) 50 MCG/ACT nasal spray Place 1 spray into both nostrils daily.      ibuprofen (ADVIL,MOTRIN) 600 MG tablet Take 1 tablet (600 mg total) by mouth every 6 (six) hours as needed (mild pain). 60 tablet 0   ketoconazole (NIZORAL) 2 % shampoo Apply topically 2 (two) times a week.     loratadine (  CLARITIN) 10 MG tablet Take 10 mg by mouth daily.     OVER THE COUNTER MEDICATION Take 1 tablet by mouth every other day. Vitamin D 10000 units and Vitamin K2     triamcinolone cream (KENALOG) 0.5 % Apply topically.     zolpidem (AMBIEN) 5 MG tablet Take 2.5 mg by mouth at bedtime as needed.     cetirizine (ZYRTEC) 5 MG chewable tablet Chew by mouth.     Cholecalciferol 100 MCG (4000 UT) CAPS Vitamin D3 100 mcg (4,000 unit) capsule   1 capsule every day by oral route.     famotidine (PEPCID) 40 MG tablet 1 tablet as needed by oral route.     ondansetron (ZOFRAN) 4 MG  tablet ondansetron HCl 4 mg tablet  TAKE 1 TAB BY MOUTH EVERY 6 HOURS AS NEEDED 1 HOUR BEFORE MEALS     No current facility-administered medications for this visit.   Known Allergies:  Allergies  Allergen Reactions   Flagyl [Metronidazole] Other (See Comments)    Dizzy   Past Surgical History: Past Surgical History:  Procedure Laterality Date   ANKLE SURGERY     ANTERIOR AND POSTERIOR REPAIR N/A 05/24/2015   Procedure: ANTERIOR (CYSTOCELE) AND POSTERIOR REPAIR (RECTOCELE);  Surgeon: Dian Queen, MD;  Location: [facility];  Service: Gynecology;  Laterality: N/A;   BREAST CYST ASPIRATION     2005   DIAGNOSTIC LAPAROSCOPY  07/14/2010   removed tubes and ovaries   GANGLION CYST EXCISION     SEPTOPLASTY     VAGINAL HYSTERECTOMY  05/24/2015   Procedure: HYSTERECTOMY VAGINAL;  Surgeon: Dian Queen, MD;  Location: [facility];  Service: Gynecology;;  uterus and cervix only   Family History: Family History  Problem Relation Age of Onset   Graves' disease Sister    Asthma Child    Asthma Child    Social History: Chelsea Vega lives in a house built 9 years ago, no water damage, carpet in the bedroom, heat pump gas, indoor dog, no pests, not using dust mite protection the bedding or pillows, no smoke exposure.  She works as an Garment/textile technologist.  + HEPA filter in the home.  [place]   ROS:  All other systems negative except as noted per HPI.  Objective:  Blood pressure 122/74, pulse (!) 56, temperature (!) 97.5 F (36.4 C), temperature source Temporal, resp. rate 17, height '5\' 5"'$  (1.651 m), weight 130 lb (59 kg), SpO2 99 %. Body mass index is 21.63 kg/m. Physical Exam:  General Appearance:  Alert, cooperative, no distress, appears stated age  Head:  Normocephalic, without obvious abnormality, atraumatic  Eyes:  Conjunctiva clear, EOM's intact  Nose: Nares normal, normal mucosa  Throat: Lips, tongue normal; teeth and gums normal, normal posterior oropharynx   Neck: Supple, symmetrical  Lungs:   clear to auscultation bilaterally, Respirations unlabored, no coughing  Heart:  regular rate and rhythm and no murmur, Appears well perfused  Extremities: No edema  Skin: Skin color, texture, turgor normal, no rashes or lesions on visualized portions of skin  Neurologic: No gross deficits     Diagnostics: Labs-see below  Assessment and Plan  Chronic Idiopathic Urticaria: Suspect precipitated secondary to viral illness - this is defined as hives lasting more than 6 weeks without an identifiable trigger - hives can be from a number of different sources including infections, allergies, vibration, temperature, pressure among many others other possible causes - often an identifiable cause is not determined - some  potential triggers include: stress, illness, NSAIDs, aspirin, hormonal changes - you do not have any red flag symptoms to make [place] concerned about secondary causes of hives, but we will screen for these for reassurance with: "CBCd, CMP, ESR, CRP, thryoid labs, alpha gal-mammalian meat delayed food allergy, and CU index-often elevated in autoimmune hives". There is a possibility your insurance will not cover these. - approximately 50% of patients with chronic hives can have some associated swelling of the face/lips/eyelids (this is not a cause for alarm and does not typically progress onto systemic allergic reactions)  Therapy Plan:  - start zyrtec (cetirizine) to max dose of '20mg'$  (2 pills) twice daily- this is maximum dose- - avoid benadryl due to unwanted side effects - can use ice packs, sarna cream to  help relieve itching - avoid overusing triamcinolone as this is unlikely helping - can increase or decrease dosing depending on symptom control to a maximum dose of 4 tablets of antihistamine daily. Wait until hives free for at least one month prior to decreasing dose.   - if hives are still uncontrolled with the above regimen, please arrange an  appointment for discussion of Xolair (omalizumab)- an injectable medication for hives  Can use one of the following in place of zyrtec if desires: Claritin (loratadine) 10 mg, Xyzal (levocetirizine) 5 mg or Allegra (fexofenadine) 180 mg daily as needed  Follow up : 4-8 weeks, sooner if needed It was a pleasure meeting you in clinic today! Thank you for allowing me to participate in your care.  This note in its entirety was forwarded to the Provider who requested this consultation.  Thank you for your kind referral. I appreciate the opportunity to take part in [facility] care. Please do not hesitate to contact me with questions.  Sincerely,  Sigurd Sos, MD [facility] [place] [facility] [place]

## 2022-09-11 ENCOUNTER — Encounter: Payer: Self-pay | Admitting: Internal Medicine

## 2022-09-11 ENCOUNTER — Ambulatory Visit: Payer: 59 | Admitting: Internal Medicine

## 2022-09-11 VITALS — BP 122/74 | HR 56 | Temp 97.5°F | Resp 17 | Ht 65.0 in | Wt 130.0 lb

## 2022-09-11 DIAGNOSIS — L508 Other urticaria: Secondary | ICD-10-CM | POA: Diagnosis not present

## 2022-09-11 NOTE — Patient Instructions (Addendum)
Chronic Idiopathic Urticaria: - this is defined as hives lasting more than 6 weeks without an identifiable trigger - hives can be from a number of different sources including infections, allergies, vibration, temperature, pressure among many others other possible causes - often an identifiable cause is not determined - some potential triggers include: stress, illness, NSAIDs, aspirin, hormonal changes - you do not have any red flag symptoms to make [place] concerned about secondary causes of hives, but we will screen for these for reassurance with: "CBCd, CMP, ESR, CRP, thryoid labs, alpha gal-mammalian meat delayed food allergy, and CU index-often elevated in autoimmune hives". There is a possibility your insurance will not cover these. - approximately 50% of patients with chronic hives can have some associated swelling of the face/lips/eyelids (this is not a cause for alarm and does not typically progress onto systemic allergic reactions)  Therapy Plan:  - start zyrtec (cetirizine) to max dose of '20mg'$  (2 pills) twice daily- this is maximum dose- - avoid benadryl due to unwanted side effects - can use ice packs, sarna cream to  help relieve itching - avoid overusing triamcinolone as this is unlikely helping - can increase or decrease dosing depending on symptom control to a maximum dose of 4 tablets of antihistamine daily. Wait until hives free for at least one month prior to decreasing dose.   - if hives are still uncontrolled with the above regimen, please arrange an appointment for discussion of Xolair (omalizumab)- an injectable medication for hives  Can use one of the following in place of zyrtec if desires: Claritin (loratadine) 10 mg, Xyzal (levocetirizine) 5 mg or Allegra (fexofenadine) 180 mg daily as needed  Follow up : 4-8 weeks, sooner if needed It was a pleasure meeting you in clinic today! Thank you for allowing me to participate in your care.  Sigurd Sos, MD [facility] 

## 2022-09-18 ENCOUNTER — Encounter: Payer: Self-pay | Admitting: Internal Medicine

## 2022-09-19 LAB — CMP14+EGFR
ALT: 26 IU/L (ref 0–32)
AST: 27 IU/L (ref 0–40)
Albumin/Globulin Ratio: 2.1 (ref 1.2–2.2)
Albumin: 5.2 g/dL — ABNORMAL HIGH (ref 3.9–4.9)
Alkaline Phosphatase: 81 IU/L (ref 44–121)
BUN/Creatinine Ratio: 20 (ref 12–28)
BUN: 16 mg/dL (ref 8–27)
Bilirubin Total: 0.7 mg/dL (ref 0.0–1.2)
CO2: 24 mmol/L (ref 20–29)
Calcium: 9.9 mg/dL (ref 8.7–10.3)
Chloride: 99 mmol/L (ref 96–106)
Creatinine, Ser: 0.82 mg/dL (ref 0.57–1.00)
Globulin, Total: 2.5 g/dL (ref 1.5–4.5)
Glucose: 81 mg/dL (ref 70–99)
Potassium: 4.1 mmol/L (ref 3.5–5.2)
Sodium: 138 mmol/L (ref 134–144)
Total Protein: 7.7 g/dL (ref 6.0–8.5)
eGFR: 80 mL/min/{1.73_m2} (ref >59)

## 2022-09-19 LAB — THYROID PANEL WITH TSH
Free Thyroxine Index: 2.1 (ref 1.2–4.9)
T3 Uptake Ratio: 26 % (ref 24–39)
T4, Total: 7.9 ug/dL (ref 4.5–12.0)
TSH: 2.41 u[IU]/mL (ref 0.450–4.500)

## 2022-09-19 LAB — CBC WITH DIFFERENTIAL
Basophils Absolute: 0.1 10*3/uL (ref 0.0–0.2)
Basos: 1 %
EOS (ABSOLUTE): 0.3 10*3/uL (ref 0.0–0.4)
Eos: 4 %
Hematocrit: 43.9 % (ref 34.0–46.6)
Hemoglobin: 15.3 g/dL (ref 11.1–15.9)
Immature Grans (Abs): 0 10*3/uL (ref 0.0–0.1)
Immature Granulocytes: 0 %
Lymphocytes Absolute: 1.5 10*3/uL (ref 0.7–3.1)
Lymphs: 24 %
MCH: 32 pg (ref 26.6–33.0)
MCHC: 34.9 g/dL (ref 31.5–35.7)
MCV: 92 fL (ref 79–97)
Monocytes Absolute: 0.5 10*3/uL (ref 0.1–0.9)
Monocytes: 8 %
Neutrophils Absolute: 4 10*3/uL (ref 1.4–7.0)
Neutrophils: 63 %
RBC: 4.78 x10E6/uL (ref 3.77–5.28)
RDW: 12 % (ref 11.7–15.4)
WBC: 6.4 10*3/uL (ref 3.4–10.8)

## 2022-09-19 LAB — ALPHA-GAL PANEL
Allergen Lamb IgE: <0.1 kU/L
Beef IgE: <0.1 kU/L
IgE (Immunoglobulin E), Serum: 60 IU/mL (ref 6–495)
O215-IgE Alpha-Gal: <0.1 kU/L
Pork IgE: <0.1 kU/L

## 2022-09-19 LAB — CHRONIC URTICARIA: cu index: <2.9 (ref <10)

## 2022-09-19 LAB — TRYPTASE: Tryptase: 5.6 ug/L (ref 2.2–13.2)

## 2022-09-19 LAB — THYROID PEROXIDASE ANTIBODY: Thyroperoxidase Ab SerPl-aCnc: 43 IU/mL — ABNORMAL HIGH (ref 0–34)

## 2022-09-19 LAB — SEDIMENTATION RATE: Sed Rate: 6 mm/hr (ref 0–40)

## 2022-09-29 LAB — HM MAMMOGRAPHY

## 2022-09-30 LAB — HM PAP SMEAR

## 2022-10-15 NOTE — Telephone Encounter (Signed)
It is fine to take 2 allegra in the morning and 2 zyrtec in the evening.  We could also try adding famotidine 20mg  twice a day.  This works a little differently than the Allegra or Zyrtec and blocking histamine and may add some additional benefit.

## 2022-10-21 NOTE — Telephone Encounter (Signed)
Can she come at  1:30 tomorrow?  I can see about starting xolair for her hives.

## 2022-10-22 ENCOUNTER — Ambulatory Visit (INDEPENDENT_AMBULATORY_CARE_PROVIDER_SITE_OTHER): Payer: Medicare Other | Admitting: Internal Medicine

## 2022-10-22 DIAGNOSIS — L501 Idiopathic urticaria: Secondary | ICD-10-CM

## 2022-10-22 DIAGNOSIS — E038 Other specified hypothyroidism: Secondary | ICD-10-CM | POA: Diagnosis not present

## 2022-10-22 MED ORDER — EPINEPHRINE 0.3 MG/0.3ML IJ SOAJ: 0.3000 mg | INTRAMUSCULAR | 1 refills | Status: DC | PRN

## 2022-10-22 MED ORDER — OMALIZUMAB 150 MG/ML ~~LOC~~ SOSY
300.0000 mg | PREFILLED_SYRINGE | SUBCUTANEOUS | Status: AC
  Administered 2022-10-22 – 2023-02-25 (×6): 300 mg via SUBCUTANEOUS

## 2022-10-22 NOTE — Telephone Encounter (Signed)
Thanks for seeing her sooner.  If it doesn't work out, happy to squeeze her in Thursday or Friday.

## 2022-10-22 NOTE — Patient Instructions (Addendum)
Chronic Idiopathic Urticaria: Not well-controlled   Therapy Plan:  -Start Xolair 300 mg subcu every 4 weeks, initial dose given today with sample  -Biologics coordinator Tammy will reach out to about the approval process. - Continue zyrtec (cetirizine) to max dose of 20mg  (2 pills) twice daily- this is maximum dose- -continue Pepcid 20 mg twice daily - avoid benadryl due to unwanted side effects - can use ice packs, sarna cream to  help relieve itching - avoid overusing triamcinolone as this is unlikely helping - can increase or decrease dosing depending on symptom control to a maximum dose of 4 tablets of antihistamine daily. Wait until hives free for at least one month prior to decreasing dose.   - if hives are still uncontrolled with the above regimen, please arrange an appointment for discussion of Xolair (omalizumab)- an injectable medication for hives  Can use one of the following in place of zyrtec if desires: Claritin (loratadine) 10 mg, Xyzal (levocetirizine) 5 mg or Allegra (fexofenadine) 180 mg daily as needed  Follow-up with endocrinology as planned  Follow up : in 3 months with Dr. Maurine Minister  It was a pleasure meeting you in clinic today! Thank you for allowing me to participate in your care.  Thank you so much for letting me partake in your care today.  Don't hesitate to reach out if you have any additional concerns!  Ferol Luz, MD  [facility] [place] [place]

## 2022-10-22 NOTE — Progress Notes (Signed)
Follow Up Note  RE: Chelsea Vega MRN: [id number] DOB: [birth year] Date of Office Visit: 10/22/2022  Referring provider: Joycelyn Rua, MD Primary care provider: Joycelyn Rua, MD  Chief Complaint: No chief complaint on file.  History of Present Illness: I had the pleasure of seeing Chelsea Vega for a follow up visit at the [facility] on 10/22/2022. She is a 67 y.o. female, who is being followed for idiopathic urticaria. Her previous allergy office visit was on 09/11/2022 with Dr. Maurine Minister. Today is a regular follow up visit.  History obtained from patient, chart review.  Since last visit patient reports persistent urticaria significantly impairing her sleep and quality of life.  Pepcid 20 mg was added onto Allegra 360 mg in the morning Zyrtec 20 mg at night without adequate relief.  She has been using ice packs and needing Ambien to sleep.  She was referred to endocrinology for hypothyroidism based on her urticaria labs.  Due to severe symptoms she was asked to come in today for Xolair start.  She is concerned that Xolair may affect environmental or food allergy testing in the future.  She understands that we need to get her hives under control first before considering any testing.  She has been referred to endocrinology but is not had her appointment yet.    Assessment and Plan: [name] is a 67 y.o. female with: Idiopathic urticaria  Other specified hypothyroidism   Plan: Patient Instructions  Chronic Idiopathic Urticaria: Not well-controlled   Therapy Plan:  -Start Xolair 300 mg subcu every 4 weeks, initial dose given today with sample  -Biologics coordinator Tammy will reach out to about the approval process. - Continue zyrtec (cetirizine) to max dose of 20mg  (2 pills) twice daily- this is maximum dose- -continue Pepcid 20 mg twice daily - avoid benadryl due to unwanted side effects - can use ice packs, sarna cream to  help relieve itching - avoid overusing  triamcinolone as this is unlikely helping - can increase or decrease dosing depending on symptom control to a maximum dose of 4 tablets of antihistamine daily. Wait until hives free for at least one month prior to decreasing dose.   - if hives are still uncontrolled with the above regimen, please arrange an appointment for discussion of Xolair (omalizumab)- an injectable medication for hives  Can use one of the following in place of zyrtec if desires: Claritin (loratadine) 10 mg, Xyzal (levocetirizine) 5 mg or Allegra (fexofenadine) 180 mg daily as needed  Follow-up with endocrinology as planned  Follow up : in 3 months with Dr. Maurine Minister  It was a pleasure meeting you in clinic today! Thank you for allowing me to participate in your care.  Thank you so much for letting me partake in your care today.  Don't hesitate to reach out if you have any additional concerns!  Ferol Luz, MD  [facility] [place] [place]       Meds ordered this encounter  Medications   omalizumab Geoffry Paradise) prefilled syringe 300 mg   EPINEPHrine 0.3 mg/0.3 mL IJ SOAJ injection    Sig: Inject 0.3 mg into the muscle as needed for anaphylaxis.    Dispense:  1 each    Refill:  1    Lab Orders  No laboratory test(s) ordered today   Diagnostics: None done  Medication List:  Current Outpatient Medications  Medication Sig Dispense Refill   EPINEPHrine 0.3 mg/0.3 mL IJ SOAJ injection Inject 0.3 mg into  the muscle as needed for anaphylaxis. 1 each 1   Calcium Carbonate (CALTRATE 600 PO) Take 1 tablet by mouth daily.       cetirizine (ZYRTEC) 5 MG chewable tablet Chew by mouth.     Cholecalciferol 100 MCG (4000 UT) CAPS Vitamin D3 100 mcg (4,000 unit) capsule   1 capsule every day by oral route.     Cholecalciferol 250 MCG (10000 UT) CAPS Take by mouth.     escitalopram (LEXAPRO) 10 MG tablet Take 10 mg by mouth daily.     Estradiol 0.52 MG/0.87 GM (0.06%) GEL Place 1 application onto the skin  daily.      famotidine (PEPCID) 40 MG tablet 1 tablet as needed by oral route.     fluticasone (FLONASE) 50 MCG/ACT nasal spray Place 1 spray into both nostrils daily.      ibuprofen (ADVIL,MOTRIN) 600 MG tablet Take 1 tablet (600 mg total) by mouth every 6 (six) hours as needed (mild pain). 60 tablet 0   ketoconazole (NIZORAL) 2 % shampoo Apply topically 2 (two) times a week.     loratadine (CLARITIN) 10 MG tablet Take 10 mg by mouth daily.     ondansetron (ZOFRAN) 4 MG tablet ondansetron HCl 4 mg tablet  TAKE 1 TAB BY MOUTH EVERY 6 HOURS AS NEEDED 1 HOUR BEFORE MEALS     OVER THE COUNTER MEDICATION Take 1 tablet by mouth every other day. Vitamin D 86754 units and Vitamin K2     triamcinolone cream (KENALOG) 0.5 % Apply topically.     zolpidem (AMBIEN) 5 MG tablet Take 2.5 mg by mouth at bedtime as needed.     Current Facility-Administered Medications  Medication Dose Route Frequency Provider Last Rate Last Admin   omalizumab Geoffry Paradise) prefilled syringe 300 mg  300 mg Subcutaneous Q28 days Ferol Luz, MD   300 mg at 10/22/22 1338   Allergies: Allergies  Allergen Reactions   Flagyl [Metronidazole] Other (See Comments)    Dizzy   I reviewed her past medical history, social history, family history, and environmental history and no significant changes have been reported from her previous visit.  ROS: All others negative except as noted per HPI.   Objective: There were no vitals taken for this visit. There is no height or weight on file to calculate BMI. General Appearance:  Alert, cooperative, no distress, appears stated age  Head:  Normocephalic, without obvious abnormality, atraumatic  Eyes:  Conjunctiva clear, EOM's intact  Nose: Nares normal,   Throat: Lips, tongue normal; teeth and gums normal,   Neck: Supple, symmetrical  Lungs:   , Respirations unlabored, no coughing  Heart:  , Appears well perfused  Extremities: No edema  Skin: Skin color, texture, turgor normal,  "erythematous macules and plaques on back with excoriation marks  Neurologic: No gross deficits   Previous notes and tests were reviewed. The plan was reviewed with the patient/family, and all questions/concerned were addressed.  It was my pleasure to see Levonda today and participate in her care. Please feel free to contact me with any questions or concerns.  Sincerely,  Ferol Luz, MD  Allergy & Immunology  [facility] Office: [phone]

## 2022-10-27 ENCOUNTER — Ambulatory Visit: Payer: 59 | Admitting: Internal Medicine

## 2022-10-29 ENCOUNTER — Ambulatory Visit (INDEPENDENT_AMBULATORY_CARE_PROVIDER_SITE_OTHER): Payer: Medicare Other | Admitting: Internal Medicine

## 2022-10-29 ENCOUNTER — Telehealth: Payer: Self-pay

## 2022-10-29 ENCOUNTER — Encounter: Payer: Self-pay | Admitting: Internal Medicine

## 2022-10-29 VITALS — BP 120/82 | HR 62 | Ht 65.0 in | Wt 126.2 lb

## 2022-10-29 DIAGNOSIS — E063 Autoimmune thyroiditis: Secondary | ICD-10-CM

## 2022-10-29 DIAGNOSIS — L508 Other urticaria: Secondary | ICD-10-CM

## 2022-10-29 LAB — T4, FREE: Free T4: 0.75 ng/dL (ref 0.60–1.60)

## 2022-10-29 LAB — TSH: TSH: 1.56 u[IU]/mL (ref 0.35–5.50)

## 2022-10-29 LAB — VITAMIN D 25 HYDROXY (VIT D DEFICIENCY, FRACTURES): VITD: 106 ng/mL (ref 30.00–100.00)

## 2022-10-29 LAB — T3, FREE: T3, Free: 2.9 pg/mL (ref 2.3–4.2)

## 2022-10-29 NOTE — Progress Notes (Unsigned)
Patient ID: Chelsea Vega, female   DOB: 08-27-1955, 67 y.o.   MRN: [id number]  HPI  Chelsea Vega is a 67 y.o.-year-old female, referred by her PCP, Dr. Joycelyn Rua, for management of recently diagnosed Hashimoto's thyroiditis.  Patient describes she initially had a URI in 05/2022.  She had cough and time but also developed itching and a rash in an 10 cm area in her lower back.  Cough was treated with a Z-Pak and the rash with triamcinolone cream and they both improved.  However, the rash then reappeared in the same spot and in 07/2022 it started to spread to legs, arms, breast, stomach and then worsened in 08/2022, when it included her scalp.  Recently, he started to have falls.  She is scratching excessively at night and has to sleep with ice packs.  During the day, the itching is better, but it worsens towards the evening, especially if she is more active.  She actually has bruises on her body due to scratching.  Patient also describes that she has anxiety, gets hot, and has nausea when the itching is worse.  Saw allergist, who obtained a normal tryptase and a normal alpha-gal test.  LFTs were normal.  Hemoglobin was also normal.  She did not have elevated eosinophiles.  Other possible causes for chronic urticaria were investigated with negative results (chronic urticaria index <2.9).  She is currently on Zyrtec and Pepcid and as needed steroid cream.  During this investigation, she has been dx'ed with Hashimoto's thyroiditis hypothyroidism in 08/2022 by elevated TPO antibodies; she is not on Levothyroxine as her thyroid function tests are normal.  She sees Dr. Vincente Poli (OB/GYN) and she suggested a referral to endocrinology.  Patient has idiopathic urticaria and her TFTs and thyroid antibodies were checked during investigation for this condition.  She continues to have hives despite taking antihistamines.  Of note, a tryptase level was normal.  I reviewed pt's thyroid tests: Lab Results   Component Value Date   TSH 2.410 09/11/2022   TSH 1.68 08/11/2006    Component     Latest Ref Rng 09/11/2022  Thyroperoxidase Ab SerPl-aCnc     0 - 34 IU/mL 43 (H)     Pt denies feeling nodules in neck, hoarseness, dysphagia/odynophagia.  She has + FH of thyroid disorders in: sister - Graves ds.. No FH of thyroid cancer. + FH of autoimmune ds. - Raynaud's phenomenon. No h/o radiation tx to head or neck. No recent use of iodine supplements. On Fish oil.  Pt. also has a history of osteoarthritis, osteoporosis, GERD (detected on barium swallow 04/2009), eyelid ptosis (s/p surgery-Dr. Galen Manila), gallstones, bradycardia, depression. She is on 10,000 units vitamin D daily.  ROS: Constitutional: + Weight loss, + decreased appetite, + fatigue, + hot flashes Eyes: no blurry vision, no xerophthalmia ENT: no sore throat, + see HPI, + tinnitus in the right ear Cardiovascular: no CP/SOB/palpitations/leg swelling Respiratory: no cough/SOB Gastrointestinal: + N/no V/no D/+ C/+ acid reflux Musculoskeletal: + muscle/joint aches in left ankle and left shoulder Skin: + rash and dry skin, + easy bruising, + itching Neurological: no tremors/numbness/tingling/dizziness Psychiatric: + both:  depression/anxiety + Low libido  PCP: She has an appointment to establish care with Dr. Ruthine Dose tomorrow.  Past Medical History:  Diagnosis Date   Abnormal vaginal Pap smear    DISTANT HX- S/P LEEP   Allergic rhinitis    Anemia    pregnancy   Asthma    childhood, early 20's  Constipation    Depression    Dysrhythmia    occassional PVC's   GERD (gastroesophageal reflux disease)    Hx of cystitis    RECURRENT, DR. TANNENBAUM   Insomnia    Osteoporosis 10/2008   DR. GREWAL, DEXA 10/2008   Postmenopausal    PVC's (premature ventricular contractions)    HX OF   Slow heart rate    Tinnitus    Trapezius muscle spasm    Vitamin D deficiency    Past Surgical History:  Procedure Laterality Date    ANKLE SURGERY     ANTERIOR AND POSTERIOR REPAIR N/A 05/24/2015   Procedure: ANTERIOR (CYSTOCELE) AND POSTERIOR REPAIR (RECTOCELE);  Surgeon: Marcelle Overlie, MD;  Location: [facility];  Service: Gynecology;  Laterality: N/A;   BREAST CYST ASPIRATION     2005   DIAGNOSTIC LAPAROSCOPY  07/14/2010   removed tubes and ovaries   GANGLION CYST EXCISION     SEPTOPLASTY     VAGINAL HYSTERECTOMY  05/24/2015   Procedure: HYSTERECTOMY VAGINAL;  Surgeon: Marcelle Overlie, MD;  Location: [facility];  Service: Gynecology;;  uterus and cervix only   Social History   Socioeconomic History   Marital status: Married    Spouse name: Not on file   Number of children: 3   Years of education: Not on file   Highest education level: Not on file  Occupational History   Occupation: NURSE  Tobacco Use   Smoking status: Never   Smokeless tobacco: Never  Vaping Use   Vaping Use: Never used  Substance and Sexual Activity   Alcohol use: Yes    Comment: occ. 1-2 wine, beer   Drug use: No   Sexual activity: Yes    Birth control/protection: Surgical  Other Topics Concern   Not on file  Social History Narrative   Not on file   Social Determinants of Health   Financial Resource Strain: Not on file  Food Insecurity: Not on file  Transportation Needs: Not on file  Physical Activity: Not on file  Stress: Not on file  Social Connections: Not on file  Intimate Partner Violence: Not on file   Current Outpatient Medications on File Prior to Visit  Medication Sig Dispense Refill   Calcium Carbonate (CALTRATE 600 PO) Take 1 tablet by mouth daily.       EPINEPHrine 0.3 mg/0.3 mL IJ SOAJ injection Inject 0.3 mg into the muscle as needed for anaphylaxis. 1 each 1   escitalopram (LEXAPRO) 10 MG tablet Take 10 mg by mouth daily.     Estradiol 0.52 MG/0.87 GM (0.06%) GEL Place 1 application onto the skin daily.      fluticasone (FLONASE) 50 MCG/ACT nasal spray Place 1 spray into both nostrils daily.      ibuprofen  (ADVIL,MOTRIN) 600 MG tablet Take 1 tablet (600 mg total) by mouth every 6 (six) hours as needed (mild pain). 60 tablet 0   OVER THE COUNTER MEDICATION Take 1 tablet by mouth every other day. Vitamin D 40981 units and Vitamin K2     triamcinolone cream (KENALOG) 0.5 % Apply topically.     zolpidem (AMBIEN) 5 MG tablet Take 2.5 mg by mouth at bedtime as needed.     Current Facility-Administered Medications on File Prior to Visit  Medication Dose Route Frequency Provider Last Rate Last Admin   omalizumab Geoffry Paradise) prefilled syringe 300 mg  300 mg Subcutaneous Q28 days Ferol Luz, MD   300 mg at 10/22/22 1338   Allergies  Allergen  Reactions   Flagyl [Metronidazole] Other (See Comments)    Dizzy   Family History  Problem Relation Age of Onset   Graves' disease Sister    Asthma Child    Asthma Child     PE: BP 120/82 (BP Location: Left Arm, Patient Position: Sitting, Cuff Size: Normal)   Pulse 62   Ht 5\' 5"  (1.651 m)   Wt 126 lb 3.2 oz (57.2 kg)   SpO2 98%   BMI 21.00 kg/m  Wt Readings from Last 3 Encounters:  10/29/22 126 lb 3.2 oz (57.2 kg)  09/11/22 130 lb (59 kg)  08/22/21 130 lb (59 kg)   Constitutional: normal weight, in NAD Eyes:  EOMI, no exophthalmos ENT: no neck masses, no cervical lymphadenopathy Cardiovascular: RRR, No MRG Respiratory: CTA B Musculoskeletal: no deformities Skin: + Bruises on upper arms, dry skin, scratch marks -erythema Neurological: no tremor with outstretched hands  ASSESSMENT: 1. Hashimoto thyroiditis  2. Chronic urticaria  PLAN: 1. Hashimoto thyroiditis - I reviewed her thyroid function test and antibody results along with the patient.  Her TPO antibodies were mildly elevated, pointing towards a diagnosis of Hashimoto's thyroiditis which has not yet evolved to hypothyroidism. - I explained that this is an autoimmune disorder, in which she develops antibodies against her own thyroid. The antibodies bind to the thyroid tissue and cause  inflammation, and, eventually, destruction of the gland and hypothyroidism. We don't know how long this process can be, it can last from months to years. As of now, based on the last results that I have, her thyroid tests are normal. We will repeat them today, however. I will also repeat her thyroid antibody levels. - I also explained that thyroid enlargement especially at the beginning of her Hashimoto thyroiditis course is not uncommon, and it has a waxing and waning character.  - We discussed about treatment for Hashimoto thyroiditis, which is actually limited to thyroid hormones in case her TFTs are abnormal. Supplements like selenium has been tried with various results, some showing improvement in the TPO antibodies. However, there are no randomized controlled trials of this are consistent results between trials. We also discussed about ways to improve her immune system (relaxation, diet, exercise, sleep) to reduce the Ab titer and, subsequently, the thyroid inflammation. - If the thyroid function test returned normal today, my suggestion would be to try selenium for 2 months.  If this is not helping, we could even try to start her on levothyroxine low-dose to see if her symptoms improve.  However, we did discuss about the risk of thyroidal suppression and the potential loss of function and lifelong levothyroxine dependence -I will have her return in 2 months -I will let her know about the results through MyChart  2.  Chronic urticaria -For the last approximately 6 months -Patient has significant quality of life impairment due to this.   The itching includes the scalp and the entire body, including palms.  It is worse at night.  She is unable to sleep and she developed bruises from scratching -Systemic mastocytosis was ruled out with a normal tryptase -Cholestasis was ruled out with normal LFTs -Alpha-gal allergy was ruled out with a normal alpha gal panel -Anemia was ruled out with a normal  hemoglobin -A parasitic infection was ruled out by normal eosinophils -She also had a normal chronic urticaria panel -She does not feel that any medications that she is taking could be causing this.  She takes occasional Ambien low-dose, but symptoms are  not correlating with this. -At today's visit, we will try to improve her thyroid antibody titer by using selenium and if this is not working, maybe a low-dose of levothyroxine, however, I explained that Hashimoto's thyroiditis and chronic bradycardia can stem from the same immune system dysregulation, but they may not be directly related. -I noticed after the patient already left that she is taking 10,000 units of vitamin D daily.  I do not have a recent vitamin D level for her so we will add this to today's labs.  She did not have a high calcium on her labs from 08/2022, though.  - Total time spent for the visit: 1 hour, in precharting, post charting, obtaining medical information from the chart and from the patient, reviewing her  previous labs, imaging evaluations, and treatments, reviewing her symptoms, counseling her about her conditions (please see the discussed topics above), and developing a plan to further investigate and treat them; she had a number of questions which I addressed.   Carlus Pavlov, MD PhD [facility] Endocrinology

## 2022-10-29 NOTE — Telephone Encounter (Signed)
Patient advised and will reduce to 3000iu daily

## 2022-10-29 NOTE — Telephone Encounter (Signed)
Lab called with critical lab on Vitamin D of 106.

## 2022-10-29 NOTE — Patient Instructions (Addendum)
Please stop at the lab.  Please return in 2 months.

## 2022-10-30 ENCOUNTER — Encounter: Payer: Self-pay | Admitting: Family Medicine

## 2022-10-30 ENCOUNTER — Encounter: Payer: Self-pay | Admitting: Internal Medicine

## 2022-10-30 ENCOUNTER — Ambulatory Visit (INDEPENDENT_AMBULATORY_CARE_PROVIDER_SITE_OTHER): Payer: Medicare Other | Admitting: Family Medicine

## 2022-10-30 VITALS — BP 112/76 | HR 56 | Temp 97.7°F | Ht 65.0 in | Wt 124.2 lb

## 2022-10-30 DIAGNOSIS — L501 Idiopathic urticaria: Secondary | ICD-10-CM

## 2022-10-30 DIAGNOSIS — E063 Autoimmune thyroiditis: Secondary | ICD-10-CM

## 2022-10-30 DIAGNOSIS — E531 Pyridoxine deficiency: Secondary | ICD-10-CM

## 2022-10-30 DIAGNOSIS — E538 Deficiency of other specified B group vitamins: Secondary | ICD-10-CM | POA: Diagnosis not present

## 2022-10-30 DIAGNOSIS — K8020 Calculus of gallbladder without cholecystitis without obstruction: Secondary | ICD-10-CM

## 2022-10-30 LAB — THYROID PEROXIDASE ANTIBODY: Thyroperoxidase Ab SerPl-aCnc: 22 IU/mL — ABNORMAL HIGH (ref <9)

## 2022-10-30 LAB — THYROGLOBULIN ANTIBODY: Thyroglobulin Ab: 1 IU/mL (ref <=1)

## 2022-10-30 NOTE — Progress Notes (Signed)
New Patient Office Visit  Subjective:  Patient ID: Chelsea Vega, female    DOB: 1956/02/19  Age: 67 y.o. MRN: [id number]  CC:  Chief Complaint  Patient presents with   Establish Care    Initial visit to establish care with new pcp No food, had coffee with creamer    HPI Chelsea Vega presents for new patient anxiety-prevent [place].  Sis ultrasound tech and bro radiologist  Anxiety-on Lexapro 5 mg.  Xanax makes "a zombie".  Lexapro after hives started.  Idiopathic urticaria-seeing allergist.  Many causes ruled out already   bad during night and itching like crazy and sleeps w/ice packs.  No sweats. Ok during day.  Starts in eve. Takes 1/4-1/2 Ambien at nightly to help.   Rash on lower back in Nov after upper respiratory infection (URI).  Zpk.  Triamcinolone-improved but Dec flared again and then all over including scalp. Saw Derm.  On Xolair Hashimoto's thyroiditis-saw Endo yesterday(reviewed note)  poss contributing to hives 30 years ago-fibromyalgia/CFS -resolved but B12 was low.  B6 low.  Mgma poss PA.   Current Outpatient Medications:    Calcium Carbonate (CALTRATE 600 PO), Take 1 tablet by mouth daily.  Chewable tablet, Disp: , Rfl:    cetirizine (ZYRTEC) 5 MG tablet, Take 5 mg by mouth daily. 2 tablets in pm, Disp: , Rfl:    EPINEPHrine 0.3 mg/0.3 mL IJ SOAJ injection, Inject 0.3 mg into the muscle as needed for anaphylaxis., Disp: 1 each, Rfl: 1   escitalopram (LEXAPRO) 10 MG tablet, Take 10 mg by mouth daily. 5 mg in am, Disp: , Rfl:    Estradiol 0.52 MG/0.87 GM (0.06%) GEL, Place 1 application onto the skin daily. , Disp: , Rfl:    famotidine (PEPCID) 40 MG tablet, Take 40 mg by mouth 2 (two) times daily., Disp: , Rfl:    fexofenadine (ALLEGRA) 180 MG tablet, Take 180 mg by mouth daily. 2 tablets in pm, Disp: , Rfl:    fluticasone (FLONASE) 50 MCG/ACT nasal spray, Place 1 spray into both nostrils daily. , Disp: , Rfl:    ibuprofen (ADVIL,MOTRIN) 600 MG tablet, Take 1 tablet (600  mg total) by mouth every 6 (six) hours as needed (mild pain)., Disp: 60 tablet, Rfl: 0   Omega-3 Fatty Acids (FISH OIL PO), Take 1 tablet by mouth daily., Disp: , Rfl:    triamcinolone cream (KENALOG) 0.5 %, Apply topically., Disp: , Rfl:    UNABLE TO FIND, daily. Med Name: THYROID SUPPORT COMPLEX WITH 200 MG SELENIUM, Disp: , Rfl:    zolpidem (AMBIEN) 5 MG tablet, Take 2.5 mg by mouth at bedtime as needed., Disp: , Rfl:    OVER THE COUNTER MEDICATION, Take 1 tablet by mouth every other day. Vitamin D 40981 units and Vitamin K2 (Patient not taking: Reported on 10/30/2022), Disp: , Rfl:   Current Facility-Administered Medications:    omalizumab Geoffry Paradise) prefilled syringe 300 mg, 300 mg, Subcutaneous, Q28 days, Ferol Luz, MD, 300 mg at 10/22/22 1338  Past Medical History:  Diagnosis Date   Abnormal vaginal Pap smear    DISTANT HX- S/P LEEP   Allergic rhinitis    Allergy young child   Hay Fever   Anemia    pregnancy   Anxiety Nov. 2023   Hives have caused it to worsen   Arthritis 2006   Left ankle- chronic pain   Asthma    childhood, early 20's   Constipation    Depression    Dysrhythmia  occassional PVC's   Gallstone    GERD (gastroesophageal reflux disease)    Hx of cystitis    RECURRENT, DR. TANNENBAUM   Hyperlipidemia Sept. 2023   No need to treat   Insomnia    Osteoporosis 10/2008   DR. GREWAL, DEXA 10/2008   Postmenopausal    PVC's (premature ventricular contractions)    HX OF   Slow heart rate    Thyroid disease Nov. 2023   Elevated Thyroid antibodies   Tinnitus    Trapezius muscle spasm    Vitamin D deficiency     Past Surgical History:  Procedure Laterality Date   ANKLE SURGERY Right 05/2011   repair of tendon   ANTERIOR AND POSTERIOR REPAIR N/A 05/24/2015   Procedure: ANTERIOR (CYSTOCELE) AND POSTERIOR REPAIR (RECTOCELE);  Surgeon: Marcelle Overlie, MD;  Location: [facility];  Service: Gynecology;  Laterality: N/A;   BLEPHAROPLASTY  12/2021   BREAST  CYST ASPIRATION     2005   COSMETIC SURGERY  June 2023   Blepharoplasty   DIAGNOSTIC LAPAROSCOPY  07/14/2010   removed tubes and ovaries   GANGLION CYST EXCISION Right 2005   right wrist   SEPTOPLASTY  2009   VAGINAL HYSTERECTOMY  05/24/2015   Procedure: HYSTERECTOMY VAGINAL;  Surgeon: Marcelle Overlie, MD;  Location: [facility];  Service: Gynecology;;  uterus and cervix only    Family History  Problem Relation Age of Onset   Cancer Mother 76       ovarian   Hyperlipidemia Mother    Anxiety disorder Mother    Asthma Mother    COPD Mother    Depression Mother    Hearing loss Mother    Miscarriages / [place] Mother    Cancer Father 15       prostate   Alcohol abuse Father    Arthritis Father    Vision loss Father    Varicose Veins Father    Arthritis Sister    Hyperlipidemia Sister    Hypertension Sister    Graves' disease Sister    Depression Sister    Hypertension Sister    Cancer Brother 61       prostate   Depression Brother    Asthma Child    Asthma Child     Social History   Socioeconomic History   Marital status: Married    Spouse name: Not on file   Number of children: 3   Years of education: Not on file   Highest education level: Not on file  Occupational History   Occupation: NURSE   Occupation: Charity fundraiser    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 at age [age].  Vaping Use   Vaping Use: Never used  Substance and Sexual Activity   Alcohol use: Yes    Alcohol/week: 12.0 standard drinks of alcohol    Types: 4 Glasses of wine, 2 Cans of beer, 6 Standard drinks or equivalent per week    Comment: Usually [place] Seltzer1-2/d   Drug use: No   Sexual activity: Not Currently    Birth control/protection: Surgical  Other Topics Concern   Not on file  Social History Narrative   2 grands   Social  Determinants of Health   Financial Resource Strain: Not on file  Food Insecurity: Not on file  Transportation Needs: Not on  file  Physical Activity: Not on file  Stress: Not on file  Social Connections: Not on file  Intimate Partner Violence: Not on file    ROS  ROS: Gen: no fever, chills .   A little more fatigued when starts walking-hasn't been walking. More in legs. Skin:HPI ENT: no ear pain, ear drainage, nasal congestion, rhinorrhea, sinus pressure, sore throat Eyes: no blurry vision, double vision Resp: no cough, wheeze,SOB CV: no CP, palpitations, LE edema,   occasional "pulling sens" left breast and more when bending.  Mamm normal.  Walks dog w/left hand.  Going on intermitt for 2 years. GI: 2-3 weeks ago-greasey food and 1 hour of colicky pain.  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 And 2-3 years ago.  Doesn't want surgery yet.  GU: no dysuria, urgency, frequency, hematuria MSK: no joint pain, myalgias, back pain Neuro: no dizziness, headache, weakness, vertigo Psych: no SI   Objective:   Today's Vitals: BP 112/76   Pulse (!) 56   Temp 97.7 F (36.5 C) (Temporal)   Ht  (1.651 m)   Wt 124 lb 4 oz (56.4 kg)   SpO2 99%   BMI 20.68 kg/m   Physical Exam  Gen: WDWN NAD HEENT: NCAT, conjunctiva not injected, sclera nonicteric NECK:  supple, no thyromegaly, no nodes, no carotid bruits CARDIAC: RRR, S1S2+, no murmur. DP 2+B LUNGS: CTAB. No wheezes ABDOMEN:  BS+, soft, NTND, No HSM, no masses EXT:  no edema MSK: no gross abnormalities.  NEURO: A&O x3.  CN II-XII intact.  PSYCH: normal mood. Good eye contact  Skin: no current rash, but ecchymoses in different phases of healing from scratching  Reviewed chart, labs  Assessment & Plan:  Hashimoto's thyroiditis  Idiopathic urticaria  Vitamin B6 deficiency  Vitamin B12 deficiency   Hashimoto's thyroiditis-antibodies + but TSH normal.  Followed by endocrine.   Idiopathic urticaria-has seen  Derm.  Seeing allergist.  Workup negative thus far. Is really interfering with life especially sleep.   After a lot of research/thought, will check hepatitis C, HIV as well.  Stop all supplements and estrogen for 2 weeks to see if any improvement. Kep food diary B6 deficiency in past-wants checked B12 deficiency in past-wants checked Gallstones-chronic.  Many years.  May have had first attack.  Avoid greasy foods.  Doesn't want to see surgeon yet.  Will monitor.  Follow-up: Return in about 6 months (around 05/01/2023) for annual physical.   Angelena Sole, MD

## 2022-10-30 NOTE — Patient Instructions (Signed)
Welcome to [facility] at [facility]! It was a pleasure meeting you today.  As discussed, Please schedule a 6 month follow up visit today.  PLEASE NOTE:  If you had any LAB tests please let us know if you have not heard back within a few days. You may see your results on MyChart before we have a chance to review them but we will give you a call once they are reviewed by us. If we ordered any REFERRALS today, please let us know if you have not heard from their office within the next week.  Let us know through MyChart if you are needing REFILLS, or have your pharmacy send us the request. You can also use MyChart to communicate with me or any office staff.  Please try these tips to maintain a healthy lifestyle:  Eat most of your calories during the day when you are active. Eliminate processed foods including packaged sweets (pies, cakes, cookies), reduce intake of potatoes, white bread, white pasta, and white rice. Look for whole grain options, oat flour or almond flour.  Each meal should contain half fruits/vegetables, one quarter protein, and one quarter carbs (no bigger than a computer mouse).  Cut down on sweet beverages. This includes juice, soda, and sweet tea. Also watch fruit intake, though this is a healthier sweet option, it still contains natural sugar! Limit to 3 servings daily.  Drink at least 1 glass of water with each meal and aim for at least 8 glasses per day  Exercise at least 150 minutes every week.   

## 2022-11-03 ENCOUNTER — Telehealth: Payer: Self-pay | Admitting: *Deleted

## 2022-11-03 ENCOUNTER — Encounter: Payer: Self-pay | Admitting: Internal Medicine

## 2022-11-03 NOTE — Telephone Encounter (Signed)
-----   Message from Ferol Luz, MD sent at 10/22/2022  3:33 PM EDT ----- Can we start this patient on xolair  every 4 weeks for urticaria. Failled zyrtec/allegra 4 times a day and pepcid  twice daily

## 2022-11-03 NOTE — Telephone Encounter (Signed)
L/m for patient to contact me to discuss Xolair for urticaria

## 2022-11-04 ENCOUNTER — Other Ambulatory Visit: Payer: Self-pay

## 2022-11-04 ENCOUNTER — Encounter: Payer: Self-pay | Admitting: Physical Therapy

## 2022-11-04 ENCOUNTER — Other Ambulatory Visit (INDEPENDENT_AMBULATORY_CARE_PROVIDER_SITE_OTHER): Payer: Medicare Other

## 2022-11-04 ENCOUNTER — Ambulatory Visit: Payer: Medicare Other | Attending: Orthopedic Surgery | Admitting: Physical Therapy

## 2022-11-04 DIAGNOSIS — G8929 Other chronic pain: Secondary | ICD-10-CM | POA: Insufficient documentation

## 2022-11-04 DIAGNOSIS — M25512 Pain in left shoulder: Secondary | ICD-10-CM | POA: Insufficient documentation

## 2022-11-04 DIAGNOSIS — R293 Abnormal posture: Secondary | ICD-10-CM | POA: Insufficient documentation

## 2022-11-04 DIAGNOSIS — E538 Deficiency of other specified B group vitamins: Secondary | ICD-10-CM

## 2022-11-04 DIAGNOSIS — M6281 Muscle weakness (generalized): Secondary | ICD-10-CM | POA: Insufficient documentation

## 2022-11-04 DIAGNOSIS — L501 Idiopathic urticaria: Secondary | ICD-10-CM | POA: Diagnosis not present

## 2022-11-04 DIAGNOSIS — E531 Pyridoxine deficiency: Secondary | ICD-10-CM

## 2022-11-04 LAB — HEPATIC FUNCTION PANEL
ALT: 19 U/L (ref 0–35)
AST: 22 U/L (ref 0–37)
Albumin: 4.4 g/dL (ref 3.5–5.2)
Alkaline Phosphatase: 54 U/L (ref 39–117)
Bilirubin, Direct: 0.1 mg/dL (ref 0.0–0.3)
Total Bilirubin: 0.6 mg/dL (ref 0.2–1.2)
Total Protein: 6.7 g/dL (ref 6.0–8.3)

## 2022-11-04 LAB — VITAMIN B12: Vitamin B-12: 166 pg/mL — ABNORMAL LOW (ref 211–911)

## 2022-11-04 NOTE — Therapy (Signed)
OUTPATIENT PHYSICAL THERAPY SHOULDER EVALUATION   Patient Name: Chelsea Vega MRN: [id number] DOB:Jan 26, 1956, 67 y.o., female Today's Date: 11/04/2022  END OF SESSION:  PT End of Session - 11/04/22 1332     Visit Number 1    Number of Visits 13    Date for PT Re-Evaluation 12/16/22    Authorization Type [employer] MCR    Authorization Time Period 11/04/22 to 12/16/22    Progress Note Due on Visit 10    PT Start Time 1316    PT Stop Time 1356    PT Time Calculation (min) 40 min    Activity Tolerance Patient tolerated treatment well    Behavior During Therapy [facility] for tasks assessed/performed             Past Medical History:  Diagnosis Date   Abnormal vaginal Pap smear    DISTANT HX- S/P LEEP   Allergic rhinitis    Allergy young child   Hay Fever   Anemia    pregnancy   Anxiety Nov. 2023   Hives have caused it to worsen   Arthritis 2006   Left ankle- chronic pain   Asthma    childhood, early 20's   Constipation    Depression    Dysrhythmia    occassional PVC's   Gallstone    GERD (gastroesophageal reflux disease)    Hx of cystitis    RECURRENT, DR. TANNENBAUM   Hyperlipidemia Sept. 2023   No need to treat   Insomnia    Osteoporosis 10/2008   DR. GREWAL, DEXA 10/2008   Postmenopausal    PVC's (premature ventricular contractions)    HX OF   Slow heart rate    Thyroid disease Nov. 2023   Elevated Thyroid antibodies   Tinnitus    Trapezius muscle spasm    Vitamin D deficiency    Past Surgical History:  Procedure Laterality Date   ANKLE SURGERY Right 05/2011   repair of tendon   ANTERIOR AND POSTERIOR REPAIR N/A 05/24/2015   Procedure: ANTERIOR (CYSTOCELE) AND POSTERIOR REPAIR (RECTOCELE);  Surgeon: Marcelle Overlie, MD;  Location: [facility];  Service: Gynecology;  Laterality: N/A;   BLEPHAROPLASTY  12/2021   BREAST CYST ASPIRATION     2005   COSMETIC SURGERY  June 2023   Blepharoplasty   DIAGNOSTIC LAPAROSCOPY  07/14/2010   removed tubes and ovaries    GANGLION CYST EXCISION Right 2005   right wrist   SEPTOPLASTY  2009   VAGINAL HYSTERECTOMY  05/24/2015   Procedure: HYSTERECTOMY VAGINAL;  Surgeon: Marcelle Overlie, MD;  Location: [facility];  Service: Gynecology;;  uterus and cervix only   Patient Active Problem List   Diagnosis Date Noted   Idiopathic urticaria 10/30/2022   Vitamin B6 deficiency 10/30/2022   Vitamin B12 deficiency 10/30/2022   Calculus of gallbladder without cholecystitis without obstruction 10/30/2022   Hashimoto's thyroiditis 10/29/2022   Prolapse of female pelvic organs 05/24/2015   Ankle sprain 03/06/2011    PCP: Jeani Sow MD   REFERRING PROVIDER: Sheral Apley, MD  REFERRING DIAG: M77.8 (ICD-10-CM) - Shoulder tendonitis, left  THERAPY DIAG:  Chronic left shoulder pain  Muscle weakness (generalized)  Abnormal posture  Rationale for Evaluation and Treatment: Rehabilitation  ONSET DATE: 10/15/2022  SUBJECTIVE:  SUBJECTIVE STATEMENT:  Shoulder started several months back, Dr. Eulah Pont injected it. It took a couple of weeks to kick in, but the injection helped. Noticed it when I was reaching for my seatbelt originally. Its a lot better now, I've not been restricting myself since getting the shot.   Hand dominance: Right  PERTINENT HISTORY:   PAIN:  Are you having pain? No  PRECAUTIONS: None  WEIGHT BEARING RESTRICTIONS: No  FALLS:  Has patient fallen in last 6 months? No  LIVING ENVIRONMENT: Lives with: lives with their spouse Lives in: House/apartment Stairs: no STE, flight inside but does not go up too often  Has following equipment at home: None  OCCUPATION: Part time nursing work   PLOF: Independent, Independent with basic ADLs, Independent with gait, and Independent with transfers  PATIENT GOALS:  get stronger, avoid another flare up   NEXT MD VISIT: Dr. Eulah Pont around May 15th   OBJECTIVE:   DIAGNOSTIC FINDINGS:    PATIENT SURVEYS:  Quick Dash 16  COGNITION: Overall cognitive status: Within functional limits for tasks assessed     SENSATION: Not tested  POSTURE: Flat lumbar and T spines, forward head with rounded shoulders   UPPER EXTREMITY ROM:   Active ROM Right eval Left eval  Shoulder flexion 138* 138*  Shoulder extension    Shoulder abduction 160* 160*  Shoulder adduction    Shoulder internal rotation FIR T7 FIR T7  Shoulder external rotation FER T4 FER T4   Elbow flexion    Elbow extension    Wrist flexion    Wrist extension    Wrist ulnar deviation    Wrist radial deviation    Wrist pronation    Wrist supination    (Blank rows = not tested)  UPPER EXTREMITY MMT:  MMT Right eval Left eval  Shoulder flexion 4+ 4  Shoulder extension 3 3  Shoulder abduction 4+ 3  Shoulder adduction    Shoulder internal rotation 4+ 4+  Shoulder external rotation 4- 3+  Middle trapezius 3+ 3+  Lower trapezius 2 2  Elbow flexion    Elbow extension    Wrist flexion    Wrist extension    Wrist ulnar deviation    Wrist radial deviation    Wrist pronation    Wrist supination    Grip strength (lbs)    (Blank rows = not tested)  SHOULDER SPECIAL TESTS:  Empty can test negative B   JOINT MOBILITY TESTING:    PALPATION:     TODAY'S TREATMENT:                                                                                                                                         DATE:   Eval  Objective measures/appropriate education   TherEX  UBE L3.5 3 min forward/3 min backward Scapular retractions red TB x12 with 3 second holds Shoulder extensions red TB  x12 with 3 second holds Scap retraction + ER x12 with 3 second holds Shoulder Is at wall x12 with 3 second holds Shoulder Ts at wall  x12 with 2 second holds Shoulder Ys at wall x12 with 2  second holds     PATIENT EDUCATION: Education details: exam findings, POC, HEP  Person educated: Patient Education method: Explanation, Demonstration, and Handouts Education comprehension: verbalized understanding, returned demonstration, and needs further education  HOME EXERCISE PROGRAM: Access Code: [email or web address] URL: [email or web address] Date: 11/04/2022 Prepared by: Nedra Hai  Exercises - Scapular Retraction with Resistance  - 2 x daily - 7 x weekly - 1 sets - 12 reps - 3 hold - Shoulder extension with resistance - Neutral  - 2 x daily - 7 x weekly - 1 sets - 12 reps - 3 hold - Shoulder External Rotation and Scapular Retraction with Resistance  - 1 x daily - 7 x weekly - 3 sets - 10 reps  ASSESSMENT:  CLINICAL IMPRESSION: Patient is a 67 y.o. F who was seen today for physical therapy evaluation and treatment for skilled PT care of L shoulder tendinitis. Exam is straight forward and as expected, she does have mild B shoulder ROM limitations but still very functional, main impairment is functional strength. Will benefit from skilled PT services to address all impairments, reduce pain, and optimize overall function.    OBJECTIVE IMPAIRMENTS: decreased strength, increased fascial restrictions, impaired UE functional use, postural dysfunction, and pain.   ACTIVITY LIMITATIONS: carrying, lifting, sleeping, and reach over head  PARTICIPATION LIMITATIONS: cleaning, laundry, shopping, community activity, occupation, and yard work  PERSONAL FACTORS: Age, Behavior pattern, Education, Fitness, Past/current experiences, Profession, Sex, Social background, and Time since onset of injury/illness/exacerbation are also affecting patient's functional outcome.   REHAB POTENTIAL: Good  CLINICAL DECISION MAKING: Stable/uncomplicated  EVALUATION COMPLEXITY: Low   GOALS: Goals reviewed with patient? Yes  SHORT TERM GOALS: Target date: 11/25/2022    Will be compliant with  appropriate progressive HEP  Baseline: Goal status: INITIAL  2.  Pain to be resolved, 0/10 Baseline:  Goal status: INITIAL  3.  Will demonstrate improved awareness of postural mechanics with functional task performance  Baseline:  Goal status: INITIAL   LONG TERM GOALS: Target date: 12/16/2022    MMT will improve by at least 1 grade in all weak groups  Baseline:  Goal status: INITIAL  2.  Will be able to push/pull without restrictions at work without increase in pain  Baseline:  Goal status: INITIAL  3.  Will be compliant in advanced HEP vs gym based exercise programming  Baseline:  Goal status: INITIAL  4.  QuickDASH score to improve by at least 50% to show improvement in functional status  Baseline:  Goal status: INITIAL    PLAN:  PT FREQUENCY: 2x/week  PT DURATION: 6 weeks  PLANNED INTERVENTIONS: Therapeutic exercises, Therapeutic activity, Neuromuscular re-education, Balance training, Gait training, Patient/Family education, Self Care, Joint mobilization, Dry Needling, Cryotherapy, Moist heat, Taping, Ionotophoresis /ml Dexamethasone, Manual therapy, and Re-evaluation  PLAN FOR NEXT SESSION: focus on functional strength/postural training   Nedra Hai PT DPT PN2

## 2022-11-04 NOTE — Telephone Encounter (Signed)
L/m again for patient to call me to discuss

## 2022-11-04 NOTE — Telephone Encounter (Signed)
Spoke to patient regarding patient assistance and although she filled out form in clinic it was outdated and will email her new consent to sign and return to me. I will be in touch regarding status of patient assistance

## 2022-11-05 ENCOUNTER — Ambulatory Visit: Payer: Self-pay | Admitting: Family Medicine

## 2022-11-06 NOTE — Progress Notes (Signed)
B 6 is still pending.  B12 is really low-does she want to try B12 daily OTC (available over the counter without a prescription) and recheck 1-2 months or start injections?

## 2022-11-07 LAB — VITAMIN B6: Vitamin B6: 16.4 ng/mL (ref 2.1–21.7)

## 2022-11-07 LAB — HEPATITIS C ANTIBODY: Hepatitis C Ab: NONREACTIVE

## 2022-11-07 LAB — HIV ANTIBODY (ROUTINE TESTING W REFLEX): HIV 1&2 Ab, 4th Generation: NONREACTIVE

## 2022-11-07 NOTE — Progress Notes (Signed)
B6 normal.   Do the B12 shots weekly x 4 then monthly and recheck B12 in 3 months-do same day as shot, but before shot

## 2022-11-10 ENCOUNTER — Ambulatory Visit: Payer: Self-pay | Admitting: Family Medicine

## 2022-11-11 ENCOUNTER — Encounter: Payer: Self-pay | Admitting: Physical Therapy

## 2022-11-11 ENCOUNTER — Ambulatory Visit: Payer: Medicare Other | Attending: Orthopedic Surgery | Admitting: Physical Therapy

## 2022-11-11 DIAGNOSIS — M25512 Pain in left shoulder: Secondary | ICD-10-CM | POA: Insufficient documentation

## 2022-11-11 DIAGNOSIS — R293 Abnormal posture: Secondary | ICD-10-CM | POA: Diagnosis present

## 2022-11-11 DIAGNOSIS — G8929 Other chronic pain: Secondary | ICD-10-CM | POA: Diagnosis present

## 2022-11-11 DIAGNOSIS — M6281 Muscle weakness (generalized): Secondary | ICD-10-CM | POA: Insufficient documentation

## 2022-11-11 NOTE — Therapy (Signed)
OUTPATIENT PHYSICAL THERAPY SHOULDER TREATMENT   Patient Name: Chelsea Vega MRN: [id number] DOB:1956/03/06, 67 y.o., female Today's Date: 11/11/2022  END OF SESSION:  PT End of Session - 11/11/22 0937     Visit Number 2    Number of Visits 13    Date for PT Re-Evaluation 12/16/22    Authorization Type [employer] MCR    Authorization Time Period 11/04/22 to 12/16/22    Progress Note Due on Visit 10    PT Start Time 0932    PT Stop Time 1011    PT Time Calculation (min) 39 min    Activity Tolerance Patient tolerated treatment well    Behavior During Therapy [facility] for tasks assessed/performed              Past Medical History:  Diagnosis Date   Abnormal vaginal Pap smear    DISTANT HX- S/P LEEP   Allergic rhinitis    Allergy young child   Hay Fever   Anemia    pregnancy   Anxiety Nov. 2023   Hives have caused it to worsen   Arthritis 2006   Left ankle- chronic pain   Asthma    childhood, early 20's   Constipation    Depression    Dysrhythmia    occassional PVC's   Gallstone    GERD (gastroesophageal reflux disease)    Hx of cystitis    RECURRENT, DR. TANNENBAUM   Hyperlipidemia Sept. 2023   No need to treat   Insomnia    Osteoporosis 10/2008   DR. GREWAL, DEXA 10/2008   Postmenopausal    PVC's (premature ventricular contractions)    HX OF   Slow heart rate    Thyroid disease Nov. 2023   Elevated Thyroid antibodies   Tinnitus    Trapezius muscle spasm    Vitamin D deficiency    Past Surgical History:  Procedure Laterality Date   ANKLE SURGERY Right 05/2011   repair of tendon   ANTERIOR AND POSTERIOR REPAIR N/A 05/24/2015   Procedure: ANTERIOR (CYSTOCELE) AND POSTERIOR REPAIR (RECTOCELE);  Surgeon: Marcelle Overlie, MD;  Location: [facility];  Service: Gynecology;  Laterality: N/A;   BLEPHAROPLASTY  12/2021   BREAST CYST ASPIRATION     2005   COSMETIC SURGERY  June 2023   Blepharoplasty   DIAGNOSTIC LAPAROSCOPY  07/14/2010   removed tubes and ovaries    GANGLION CYST EXCISION Right 2005   right wrist   SEPTOPLASTY  2009   VAGINAL HYSTERECTOMY  05/24/2015   Procedure: HYSTERECTOMY VAGINAL;  Surgeon: Marcelle Overlie, MD;  Location: [facility];  Service: Gynecology;;  uterus and cervix only   Patient Active Problem List   Diagnosis Date Noted   Idiopathic urticaria 10/30/2022   Vitamin B6 deficiency 10/30/2022   Vitamin B12 deficiency 10/30/2022   Calculus of gallbladder without cholecystitis without obstruction 10/30/2022   Hashimoto's thyroiditis 10/29/2022   Prolapse of female pelvic organs 05/24/2015   Ankle sprain 03/06/2011    PCP: Jeani Sow MD   REFERRING PROVIDER: Sheral Apley, MD  REFERRING DIAG: M77.8 (ICD-10-CM) - Shoulder tendonitis, left  THERAPY DIAG:  Chronic left shoulder pain  Muscle weakness (generalized)  Abnormal posture  Rationale for Evaluation and Treatment: Rehabilitation  ONSET DATE: 10/15/2022  SUBJECTIVE:  SUBJECTIVE STATEMENT:  Everything is feeling ok, missed some HEP exercises this weekend due to being at the beach but have been better at keeping up with them since we got back. Was sore at first from HEP but not anymore.   Hand dominance: Right  PERTINENT HISTORY:   PAIN:  Are you having pain? No  PRECAUTIONS: None  WEIGHT BEARING RESTRICTIONS: No  FALLS:  Has patient fallen in last 6 months? No  LIVING ENVIRONMENT: Lives with: lives with their spouse Lives in: House/apartment Stairs: no STE, flight inside but does not go up too often  Has following equipment at home: None  OCCUPATION: Part time nursing work   PLOF: Independent, Independent with basic ADLs, Independent with gait, and Independent with transfers  PATIENT GOALS: get stronger, avoid another flare up   NEXT MD VISIT: Dr.  Eulah Pont around May 15th   OBJECTIVE:   DIAGNOSTIC FINDINGS:    PATIENT SURVEYS:  Quick Dash 16  COGNITION: Overall cognitive status: Within functional limits for tasks assessed     SENSATION: Not tested  POSTURE: Flat lumbar and T spines, forward head with rounded shoulders   UPPER EXTREMITY ROM:   Active ROM Right eval Left eval  Shoulder flexion 138* 138*  Shoulder extension    Shoulder abduction 160* 160*  Shoulder adduction    Shoulder internal rotation FIR T7 FIR T7  Shoulder external rotation FER T4 FER T4   Elbow flexion    Elbow extension    Wrist flexion    Wrist extension    Wrist ulnar deviation    Wrist radial deviation    Wrist pronation    Wrist supination    (Blank rows = not tested)  UPPER EXTREMITY MMT:  MMT Right eval Left eval  Shoulder flexion 4+ 4  Shoulder extension 3 3  Shoulder abduction 4+ 3  Shoulder adduction    Shoulder internal rotation 4+ 4+  Shoulder external rotation 4- 3+  Middle trapezius 3+ 3+  Lower trapezius 2 2  Elbow flexion    Elbow extension    Wrist flexion    Wrist extension    Wrist ulnar deviation    Wrist radial deviation    Wrist pronation    Wrist supination    Grip strength (lbs)    (Blank rows = not tested)  SHOULDER SPECIAL TESTS:  Empty can test negative B   JOINT MOBILITY TESTING:    PALPATION:     TODAY'S TREATMENT:                                                                                                                                         DATE:   11/11/22  TherEx  UBE L4 x3 min forward/3 min backwards [place] 6 (minus the Y exercises in prone due to pain) x10 each 0#  Shoulder Ys at wall palms flat 0# x10 Shoulder Ys at  wall thumbs out 0# x10 Serratus punches 3# x10 supine Serratus shoulder circles CW/CCW 3# x10 each Serratus shoulder alphabet 3# x2  3 way reaches red TB x10 B Rhomboid shoulder flexion red TB x10  Shoulder flexion 3# back to wall x15 Shoulder  ABD 3# R side to wall x15  Corner stretch 3x30 seconds Thoracic excursions x20 all directions        Eval  Objective measures/appropriate education   TherEX  UBE L3.5 3 min forward/3 min backward Scapular retractions red TB x12 with 3 second holds Shoulder extensions red TB x12 with 3 second holds Scap retraction + ER x12 with 3 second holds Shoulder Is at wall x12 with 3 second holds Shoulder Ts at wall  x12 with 2 second holds Shoulder Ys at wall x12 with 2 second holds     PATIENT EDUCATION: Education details: exam findings, POC, HEP  Person educated: Patient Education method: Explanation, Demonstration, and Handouts Education comprehension: verbalized understanding, returned demonstration, and needs further education  HOME EXERCISE PROGRAM: Access Code: [email or web address] URL: [email or web address] Date: 11/04/2022 Prepared by: Nedra Hai  Exercises - Scapular Retraction with Resistance  - 2 x daily - 7 x weekly - 1 sets - 12 reps - 3 hold - Shoulder extension with resistance - Neutral  - 2 x daily - 7 x weekly - 1 sets - 12 reps - 3 hold - Shoulder External Rotation and Scapular Retraction with Resistance  - 1 x daily - 7 x weekly - 3 sets - 10 reps  ASSESSMENT:  CLINICAL IMPRESSION:   Shaylon arrives doing OK, sounds like HEP is starting to get a bit easier. Focused on strength today as per POC. Will continue efforts and will progress as appropriate/tolerated.   OBJECTIVE IMPAIRMENTS: decreased strength, increased fascial restrictions, impaired UE functional use, postural dysfunction, and pain.   ACTIVITY LIMITATIONS: carrying, lifting, sleeping, and reach over head  PARTICIPATION LIMITATIONS: cleaning, laundry, shopping, community activity, occupation, and yard work  PERSONAL FACTORS: Age, Behavior pattern, Education, Fitness, Past/current experiences, Profession, Sex, Social background, and Time since onset of injury/illness/exacerbation are also  affecting patient's functional outcome.   REHAB POTENTIAL: Good  CLINICAL DECISION MAKING: Stable/uncomplicated  EVALUATION COMPLEXITY: Low   GOALS: Goals reviewed with patient? Yes  SHORT TERM GOALS: Target date: 11/25/2022    Will be compliant with appropriate progressive HEP  Baseline: Goal status: INITIAL  2.  Pain to be resolved, 0/10 Baseline:  Goal status: INITIAL  3.  Will demonstrate improved awareness of postural mechanics with functional task performance  Baseline:  Goal status: INITIAL   LONG TERM GOALS: Target date: 12/16/2022    MMT will improve by at least 1 grade in all weak groups  Baseline:  Goal status: INITIAL  2.  Will be able to push/pull without restrictions at work without increase in pain  Baseline:  Goal status: INITIAL  3.  Will be compliant in advanced HEP vs gym based exercise programming  Baseline:  Goal status: INITIAL  4.  QuickDASH score to improve by at least 50% to show improvement in functional status  Baseline:  Goal status: INITIAL    PLAN:  PT FREQUENCY: 2x/week  PT DURATION: 6 weeks  PLANNED INTERVENTIONS: Therapeutic exercises, Therapeutic activity, Neuromuscular re-education, Balance training, Gait training, Patient/Family education, Self Care, Joint mobilization, Dry Needling, Cryotherapy, Moist heat, Taping, Ionotophoresis 4mg /ml Dexamethasone, Manual therapy, and Re-evaluation  PLAN FOR NEXT SESSION: focus on functional strength/postural training, progress postural training in HEP?   Nedra Hai  PT DPT PN2

## 2022-11-12 ENCOUNTER — Ambulatory Visit (INDEPENDENT_AMBULATORY_CARE_PROVIDER_SITE_OTHER): Payer: Medicare Other

## 2022-11-12 DIAGNOSIS — E538 Deficiency of other specified B group vitamins: Secondary | ICD-10-CM | POA: Diagnosis not present

## 2022-11-12 MED ORDER — CYANOCOBALAMIN 1000 MCG/ML IJ SOLN
1000.0000 ug | Freq: Once | INTRAMUSCULAR | Status: AC
  Administered 2022-11-12: 1000 ug via INTRAMUSCULAR

## 2022-11-12 NOTE — Progress Notes (Signed)
Pt tolerated b12 injection well. 

## 2022-11-14 ENCOUNTER — Encounter: Payer: 59 | Admitting: Physical Therapy

## 2022-11-18 ENCOUNTER — Ambulatory Visit: Payer: Medicare Other | Attending: Orthopedic Surgery

## 2022-11-18 DIAGNOSIS — M25512 Pain in left shoulder: Secondary | ICD-10-CM | POA: Insufficient documentation

## 2022-11-18 DIAGNOSIS — G8929 Other chronic pain: Secondary | ICD-10-CM

## 2022-11-18 DIAGNOSIS — M6281 Muscle weakness (generalized): Secondary | ICD-10-CM | POA: Diagnosis present

## 2022-11-18 DIAGNOSIS — R293 Abnormal posture: Secondary | ICD-10-CM | POA: Insufficient documentation

## 2022-11-18 NOTE — Therapy (Signed)
OUTPATIENT PHYSICAL THERAPY SHOULDER TREATMENT   Patient Name: Chelsea Vega MRN: [id number] DOB:1956/01/03, 67 y.o., female Today's Date: 11/18/2022  END OF SESSION:  PT End of Session - 11/18/22 0901     Visit Number 3    Number of Visits 13    Date for PT Re-Evaluation 12/16/22    Authorization Type [employer] MCR    Authorization Time Period 11/04/22 to 12/16/22    Progress Note Due on Visit 10    PT Start Time 0848    PT Stop Time 0929    PT Time Calculation (min) 41 min    Activity Tolerance Patient tolerated treatment well    Behavior During Therapy [facility] for tasks assessed/performed              Past Medical History:  Diagnosis Date   Abnormal vaginal Pap smear    DISTANT HX- S/P LEEP   Allergic rhinitis    Allergy young child   Hay Fever   Anemia    pregnancy   Anxiety Nov. 2023   Hives have caused it to worsen   Arthritis 2006   Left ankle- chronic pain   Asthma    childhood, early 20's   Constipation    Depression    Dysrhythmia    occassional PVC's   Gallstone    GERD (gastroesophageal reflux disease)    Hx of cystitis    RECURRENT, DR. TANNENBAUM   Hyperlipidemia Sept. 2023   No need to treat   Insomnia    Osteoporosis 10/2008   DR. GREWAL, DEXA 10/2008   Postmenopausal    PVC's (premature ventricular contractions)    HX OF   Slow heart rate    Thyroid disease Nov. 2023   Elevated Thyroid antibodies   Tinnitus    Trapezius muscle spasm    Vitamin D deficiency    Past Surgical History:  Procedure Laterality Date   ANKLE SURGERY Right 05/2011   repair of tendon   ANTERIOR AND POSTERIOR REPAIR N/A 05/24/2015   Procedure: ANTERIOR (CYSTOCELE) AND POSTERIOR REPAIR (RECTOCELE);  Surgeon: Marcelle Overlie, MD;  Location: [facility];  Service: Gynecology;  Laterality: N/A;   BLEPHAROPLASTY  12/2021   BREAST CYST ASPIRATION     2005   COSMETIC SURGERY  June 2023   Blepharoplasty   DIAGNOSTIC LAPAROSCOPY  07/14/2010   removed tubes and ovaries    GANGLION CYST EXCISION Right 2005   right wrist   SEPTOPLASTY  2009   VAGINAL HYSTERECTOMY  05/24/2015   Procedure: HYSTERECTOMY VAGINAL;  Surgeon: Marcelle Overlie, MD;  Location: [facility];  Service: Gynecology;;  uterus and cervix only   Patient Active Problem List   Diagnosis Date Noted   Idiopathic urticaria 10/30/2022   Vitamin B6 deficiency 10/30/2022   Vitamin B12 deficiency 10/30/2022   Calculus of gallbladder without cholecystitis without obstruction 10/30/2022   Hashimoto's thyroiditis 10/29/2022   Prolapse of female pelvic organs 05/24/2015   Ankle sprain 03/06/2011    PCP: Jeani Sow MD   REFERRING PROVIDER: Sheral Apley, MD  REFERRING DIAG: M77.8 (ICD-10-CM) - Shoulder tendonitis, left  THERAPY DIAG:  Chronic left shoulder pain  Muscle weakness (generalized)  Abnormal posture  Rationale for Evaluation and Treatment: Rehabilitation  ONSET DATE: 10/15/2022  SUBJECTIVE:  SUBJECTIVE STATEMENT:  HEP going well. Still reaching behind back that aggravates it. NO pain today.  Hand dominance: Right  PERTINENT HISTORY:   PAIN:  Are you having pain? No  PRECAUTIONS: None  WEIGHT BEARING RESTRICTIONS: No  FALLS:  Has patient fallen in last 6 months? No  LIVING ENVIRONMENT: Lives with: lives with their spouse Lives in: House/apartment Stairs: no STE, flight inside but does not go up too often  Has following equipment at home: None  OCCUPATION: Part time nursing work   PLOF: Independent, Independent with basic ADLs, Independent with gait, and Independent with transfers  PATIENT GOALS: get stronger, avoid another flare up   NEXT MD VISIT: Dr. Eulah Pont around May 15th   OBJECTIVE:   DIAGNOSTIC FINDINGS:    PATIENT SURVEYS:  Quick Dash 16  COGNITION: Overall  cognitive status: Within functional limits for tasks assessed     SENSATION: Not tested  POSTURE: Flat lumbar and T spines, forward head with rounded shoulders   UPPER EXTREMITY ROM:   Active ROM Right eval Left eval  Shoulder flexion 138* 138*  Shoulder extension    Shoulder abduction 160* 160*  Shoulder adduction    Shoulder internal rotation FIR T7 FIR T7  Shoulder external rotation FER T4 FER T4   Elbow flexion    Elbow extension    Wrist flexion    Wrist extension    Wrist ulnar deviation    Wrist radial deviation    Wrist pronation    Wrist supination    (Blank rows = not tested)  UPPER EXTREMITY MMT:  MMT Right eval Left eval  Shoulder flexion 4+ 4  Shoulder extension 3 3  Shoulder abduction 4+ 3  Shoulder adduction    Shoulder internal rotation 4+ 4+  Shoulder external rotation 4- 3+  Middle trapezius 3+ 3+  Lower trapezius 2 2  Elbow flexion    Elbow extension    Wrist flexion    Wrist extension    Wrist ulnar deviation    Wrist radial deviation    Wrist pronation    Wrist supination    Grip strength (lbs)    (Blank rows = not tested)  SHOULDER SPECIAL TESTS:  Empty can test negative B   JOINT MOBILITY TESTING:    PALPATION:     TODAY'S TREATMENT:                                                                                                                                         DATE:  11/18/22 UBE L4.0 3 min each way Standing rows green TB 10x3" Standing shoulder extension green TB 10x3" Standing B ER green TB 10x3" I,T,Y standing at doorframe 10x each direction Serratus slides up wall with pillowcase 2x10 Seated rows high grip 2x10 20# Body blade arm to side small oscillation 2x for 10 seconds S/L L shld ER 2# x 10  S/L  L shld abduction x 10 S/L L shld horiz ABD x 10 S/L L shld flexion x 10  11/11/22  TherEx  UBE L4 x3 min forward/3 min backwards Blackburn 6 (minus the Y exercises in prone due to pain) x10 each 0#   Shoulder Ys at wall palms flat 0# x10 Shoulder Ys at wall thumbs out 0# x10 Serratus punches 3# x10 supine Serratus shoulder circles CW/CCW 3# x10 each Serratus shoulder alphabet 3# x2  3 way reaches red TB x10 B Rhomboid shoulder flexion red TB x10  Shoulder flexion 3# back to wall x15 Shoulder ABD 3# R side to wall x15  Corner stretch 3x30 seconds Thoracic excursions x20 all directions        Eval  Objective measures/appropriate education   TherEX  UBE L3.5 3 min forward/3 min backward Scapular retractions red TB x12 with 3 second holds Shoulder extensions red TB x12 with 3 second holds Scap retraction + ER x12 with 3 second holds Shoulder Is at wall x12 with 3 second holds Shoulder Ts at wall  x12 with 2 second holds Shoulder Ys at wall x12 with 2 second holds     PATIENT EDUCATION: Education details: exam findings, POC, HEP  Person educated: Patient Education method: Explanation, Demonstration, and Handouts Education comprehension: verbalized understanding, returned demonstration, and needs further education  HOME EXERCISE PROGRAM: Access Code: [email or web address] URL: [email or web address] Date: 11/04/2022 Prepared by: Nedra Hai  Exercises - Scapular Retraction with Resistance  - 2 x daily - 7 x weekly - 1 sets - 12 reps - 3 hold - Shoulder extension with resistance - Neutral  - 2 x daily - 7 x weekly - 1 sets - 12 reps - 3 hold - Shoulder External Rotation and Scapular Retraction with Resistance  - 1 x daily - 7 x weekly - 3 sets - 10 reps  ASSESSMENT:  CLINICAL IMPRESSION: Advanced through exercises focusing on postural strength and shoulder stabilization. The S/L external rotation was the most challenging exercise. Progressed the HEP exercises to green TB. She would continue to benefit from skilled therapy to address deficits.  OBJECTIVE IMPAIRMENTS: decreased strength, increased fascial restrictions, impaired UE functional use, postural  dysfunction, and pain.   ACTIVITY LIMITATIONS: carrying, lifting, sleeping, and reach over head  PARTICIPATION LIMITATIONS: cleaning, laundry, shopping, community activity, occupation, and yard work  PERSONAL FACTORS: Age, Behavior pattern, Education, Fitness, Past/current experiences, Profession, Sex, Social background, and Time since onset of injury/illness/exacerbation are also affecting patient's functional outcome.   REHAB POTENTIAL: Good  CLINICAL DECISION MAKING: Stable/uncomplicated  EVALUATION COMPLEXITY: Low   GOALS: Goals reviewed with patient? Yes  SHORT TERM GOALS: Target date: 11/25/2022    Will be compliant with appropriate progressive HEP  Baseline: Goal status: INITIAL  2.  Pain to be resolved, 0/10 Baseline:  Goal status: INITIAL  3.  Will demonstrate improved awareness of postural mechanics with functional task performance  Baseline:  Goal status: INITIAL   LONG TERM GOALS: Target date: 12/16/2022    MMT will improve by at least 1 grade in all weak groups  Baseline:  Goal status: INITIAL  2.  Will be able to push/pull without restrictions at work without increase in pain  Baseline:  Goal status: INITIAL  3.  Will be compliant in advanced HEP vs gym based exercise programming  Baseline:  Goal status: INITIAL  4.  QuickDASH score to improve by at least 50% to show improvement in functional status  Baseline:  Goal status: INITIAL  PLAN:  PT FREQUENCY: 2x/week  PT DURATION: 6 weeks  PLANNED INTERVENTIONS: Therapeutic exercises, Therapeutic activity, Neuromuscular re-education, Balance training, Gait training, Patient/Family education, Self Care, Joint mobilization, Dry Needling, Cryotherapy, Moist heat, Taping, Ionotophoresis 4mg /ml Dexamethasone, Manual therapy, and Re-evaluation  PLAN FOR NEXT SESSION: focus on functional strength/postural training, progress postural training in HEP?   Verta Ellen, PTA

## 2022-11-19 ENCOUNTER — Ambulatory Visit (INDEPENDENT_AMBULATORY_CARE_PROVIDER_SITE_OTHER): Payer: Medicare Other

## 2022-11-19 DIAGNOSIS — E538 Deficiency of other specified B group vitamins: Secondary | ICD-10-CM | POA: Diagnosis not present

## 2022-11-19 MED ORDER — CYANOCOBALAMIN 1000 MCG/ML IJ SOLN
1000.0000 ug | Freq: Once | INTRAMUSCULAR | Status: AC
  Administered 2022-11-19: 1000 ug via INTRAMUSCULAR

## 2022-11-19 NOTE — Progress Notes (Signed)
Chelsea Vega 67 yr old female presents to office today for 2nd of 4 weekly B12 injections per Lutricia Horsfall, MD. Administered CYANOCOBALAMIN 1,000 mcg IM right arm. Patient tolerated well.

## 2022-11-20 ENCOUNTER — Ambulatory Visit (INDEPENDENT_AMBULATORY_CARE_PROVIDER_SITE_OTHER): Payer: Medicare Other

## 2022-11-20 DIAGNOSIS — L501 Idiopathic urticaria: Secondary | ICD-10-CM | POA: Diagnosis not present

## 2022-11-21 ENCOUNTER — Ambulatory Visit: Payer: Medicare Other | Admitting: Physical Therapy

## 2022-11-21 ENCOUNTER — Encounter: Payer: Self-pay | Admitting: Physical Therapy

## 2022-11-21 DIAGNOSIS — M6281 Muscle weakness (generalized): Secondary | ICD-10-CM

## 2022-11-21 DIAGNOSIS — R293 Abnormal posture: Secondary | ICD-10-CM

## 2022-11-21 DIAGNOSIS — M25512 Pain in left shoulder: Secondary | ICD-10-CM | POA: Diagnosis not present

## 2022-11-21 DIAGNOSIS — G8929 Other chronic pain: Secondary | ICD-10-CM

## 2022-11-21 NOTE — Therapy (Signed)
OUTPATIENT PHYSICAL THERAPY SHOULDER TREATMENT   Patient Name: Chelsea Vega MRN: [id number] DOB:1956/03/25, 67 y.o., female Today's Date: 11/21/2022  END OF SESSION:  PT End of Session - 11/21/22 0845     Visit Number 4    Number of Visits 13    Date for PT Re-Evaluation 12/16/22    Authorization Type [employer] MCR    Authorization Time Period 11/04/22 to 12/16/22    Progress Note Due on Visit 10    PT Start Time 0845    PT Stop Time 0933    PT Time Calculation (min) 48 min    Activity Tolerance Patient tolerated treatment well    Behavior During Therapy [facility] for tasks assessed/performed              Past Medical History:  Diagnosis Date   Abnormal vaginal Pap smear    DISTANT HX- S/P LEEP   Allergic rhinitis    Allergy young child   Hay Fever   Anemia    pregnancy   Anxiety Nov. 2023   Hives have caused it to worsen   Arthritis 2006   Left ankle- chronic pain   Asthma    childhood, early 20's   Constipation    Depression    Dysrhythmia    occassional PVC's   Gallstone    GERD (gastroesophageal reflux disease)    Hx of cystitis    RECURRENT, DR. TANNENBAUM   Hyperlipidemia Sept. 2023   No need to treat   Insomnia    Osteoporosis 10/2008   DR. GREWAL, DEXA 10/2008   Postmenopausal    PVC's (premature ventricular contractions)    HX OF   Slow heart rate    Thyroid disease Nov. 2023   Elevated Thyroid antibodies   Tinnitus    Trapezius muscle spasm    Vitamin D deficiency    Past Surgical History:  Procedure Laterality Date   ANKLE SURGERY Right 05/2011   repair of tendon   ANTERIOR AND POSTERIOR REPAIR N/A 05/24/2015   Procedure: ANTERIOR (CYSTOCELE) AND POSTERIOR REPAIR (RECTOCELE);  Surgeon: Marcelle Overlie, MD;  Location: [facility];  Service: Gynecology;  Laterality: N/A;   BLEPHAROPLASTY  12/2021   BREAST CYST ASPIRATION     2005   COSMETIC SURGERY  June 2023   Blepharoplasty   DIAGNOSTIC LAPAROSCOPY  07/14/2010   removed tubes and ovaries    GANGLION CYST EXCISION Right 2005   right wrist   SEPTOPLASTY  2009   VAGINAL HYSTERECTOMY  05/24/2015   Procedure: HYSTERECTOMY VAGINAL;  Surgeon: Marcelle Overlie, MD;  Location: [facility];  Service: Gynecology;;  uterus and cervix only   Patient Active Problem List   Diagnosis Date Noted   Idiopathic urticaria 10/30/2022   Vitamin B6 deficiency 10/30/2022   Vitamin B12 deficiency 10/30/2022   Calculus of gallbladder without cholecystitis without obstruction 10/30/2022   Hashimoto's thyroiditis 10/29/2022   Prolapse of female pelvic organs 05/24/2015   Ankle sprain 03/06/2011    PCP: Jeani Sow MD   REFERRING PROVIDER: Sheral Apley, MD  REFERRING DIAG: M77.8 (ICD-10-CM) - Shoulder tendonitis, left  THERAPY DIAG:  Chronic left shoulder pain  Muscle weakness (generalized)  Abnormal posture  Rationale for Evaluation and Treatment: Rehabilitation  ONSET DATE: 10/15/2022  SUBJECTIVE:  SUBJECTIVE STATEMENT:  HEP going well. Trying to pay attention to posture.  Reaching is still the problem - reaching for seatbelt or behind.  No pain today.  Hand dominance: Right  PERTINENT HISTORY:   PAIN:  Are you having pain? No  PRECAUTIONS: None  WEIGHT BEARING RESTRICTIONS: No  FALLS:  Has patient fallen in last 6 months? No  LIVING ENVIRONMENT: Lives with: lives with their spouse Lives in: House/apartment Stairs: no STE, flight inside but does not go up too often  Has following equipment at home: None  OCCUPATION: Part time nursing work   PLOF: Independent, Independent with basic ADLs, Independent with gait, and Independent with transfers  PATIENT GOALS: get stronger, avoid another flare up   NEXT MD VISIT: Dr. Eulah Pont around May 15th   OBJECTIVE:   DIAGNOSTIC FINDINGS:     PATIENT SURVEYS:  Quick Dash 16  COGNITION: Overall cognitive status: Within functional limits for tasks assessed     SENSATION: Not tested  POSTURE: Flat lumbar and T spines, forward head with rounded shoulders   UPPER EXTREMITY ROM:   Active ROM Right eval Left eval  Shoulder flexion 138* 138*  Shoulder extension    Shoulder abduction 160* 160*  Shoulder adduction    Shoulder internal rotation FIR T7 FIR T7  Shoulder external rotation FER T4 FER T4   Elbow flexion    Elbow extension    Wrist flexion    Wrist extension    Wrist ulnar deviation    Wrist radial deviation    Wrist pronation    Wrist supination    (Blank rows = not tested)  UPPER EXTREMITY MMT:  MMT Right eval Left eval  Shoulder flexion 4+ 4  Shoulder extension 3 3  Shoulder abduction 4+ 3  Shoulder adduction    Shoulder internal rotation 4+ 4+  Shoulder external rotation 4- 3+  Middle trapezius 3+ 3+  Lower trapezius 2 2  Elbow flexion    Elbow extension    Wrist flexion    Wrist extension    Wrist ulnar deviation    Wrist radial deviation    Wrist pronation    Wrist supination    Grip strength (lbs)    (Blank rows = not tested)  SHOULDER SPECIAL TESTS:  Empty can test negative B   JOINT MOBILITY TESTING:    PALPATION:     TODAY'S TREATMENT:                                                                                                                                         DATE:   11/21/22 Therapeutic Exercise: to improve strength and mobility.  Demo, verbal and tactile cues throughout for technique. UBE L4 x 6 min (x3 min f/b) Standing rows green TB 2 x 10x3" Standing shoulder extension green TB 2 x 10x3" Standing B ER green TB 2 x 10x3" S/L L shld  ER 1# 2 x 10  S/L L shld horiz ABD  1# 2 x 10 S/L L shld flexion  #1 2 x 10 Manual Therapy: to decrease muscle spasm and pain and improve mobility STM/TPR to R supraspinatus, skilled palpation and monitoring during dry  needling. Trigger Point Dry-Needling  Treatment instructions: Expect mild to moderate muscle soreness. S/S of pneumothorax if dry needled over a lung field, and to seek immediate medical attention should they occur. Patient verbalized understanding of these instructions and education. Patient Consent Given: Yes Education handout provided: Yes Muscles treated: R supraspinatus Electrical stimulation performed: No Parameters: N/A Treatment response/outcome: Twitch Response Elicited and Palpable Increase in Muscle Length   11/18/22 UBE L4.0 3 min each way Standing rows green TB 10x3" Standing shoulder extension green TB 10x3" Standing B ER green TB 10x3" I,T,Y standing at doorframe 10x each direction Serratus slides up wall with pillowcase 2x10 Seated rows high grip 2x10 20# Body blade arm to side small oscillation 2x for 10 seconds S/L L shld ER 2# x 10  S/L L shld abduction x 10 S/L L shld horiz ABD x 10 S/L L shld flexion x 10  11/11/22  TherEx  UBE L4 x3 min forward/3 min backwards Blackburn 6 (minus the Y exercises in prone due to pain) x10 each 0#  Shoulder Ys at wall palms flat 0# x10 Shoulder Ys at wall thumbs out 0# x10 Serratus punches 3# x10 supine Serratus shoulder circles CW/CCW 3# x10 each Serratus shoulder alphabet 3# x2  3 way reaches red TB x10 B Rhomboid shoulder flexion red TB x10  Shoulder flexion 3# back to wall x15 Shoulder ABD 3# R side to wall x15  Corner stretch 3x30 seconds Thoracic excursions x20 all directions   PATIENT EDUCATION: Education details: HEP update, TrDN Person educated: Patient Education method: Explanation, Demonstration, Verbal cues, and Handouts Education comprehension: verbalized understanding, returned demonstration, and needs further education  HOME EXERCISE PROGRAM: Access Code: [email or web address] URL: [email or web address] Date: 11/21/2022 Prepared by: Harrie Foreman  Exercises - Scapular Retraction with  Resistance  - 2 x daily - 7 x weekly - 1 sets - 12 reps - 3 hold - Shoulder extension with resistance - Neutral  - 2 x daily - 7 x weekly - 1 sets - 12 reps - 3 hold - Shoulder External Rotation and Scapular Retraction with Resistance  - 1 x daily - 7 x weekly - 3 sets - 10 reps - Sidelying Shoulder Flexion 15 Degrees  - 1 x daily - 7 x weekly - 3 sets - 10 reps - Sidelying Shoulder Abduction Palm Forward  - 1 x daily - 7 x weekly - 3 sets - 10 reps - Sidelying Shoulder External Rotation Dumbbell  - 1 x daily - 7 x weekly - 3 sets - 10 reps  Patient Education - Office Posture  ASSESSMENT:  CLINICAL IMPRESSION: ARCELIA ESSINGTON reports good compliance with GTB exercises, meeting STG #1.  Reviewed and progress exercises today, tolerated well.  Noted tightness/tenderness in supraspinatus, After explanation of DN rational, procedures, outcomes and potential side effects, patient verbalized consent to DN treatment in conjunction with manual STM/DTM and TPR to reduce ttp/muscle tension. Muscles treated as indicated above. DN produced normal response with good twitches elicited resulting in palpable reduction in pain/ttp and muscle tension, with patient noting less pain upon initiation of movement following DN. Pt educated to expect mild to moderate muscle soreness for up to 24-48 hrs and instructed to continue prescribed home  exercise program and current activity level with pt verbalizing understanding of theses instructions.  Also educated on posture and affect on shoulder function and office posture, although typically works on laptop sitting on couch.  EMILE PERON continues to demonstrate potential for improvement and would benefit from continued skilled therapy to address impairments.    OBJECTIVE IMPAIRMENTS: decreased strength, increased fascial restrictions, impaired UE functional use, postural dysfunction, and pain.   ACTIVITY LIMITATIONS: carrying, lifting, sleeping, and reach over  head  PARTICIPATION LIMITATIONS: cleaning, laundry, shopping, community activity, occupation, and yard work  PERSONAL FACTORS: Age, Behavior pattern, Education, Fitness, Past/current experiences, Profession, Sex, Social background, and Time since onset of injury/illness/exacerbation are also affecting patient's functional outcome.   REHAB POTENTIAL: Good  CLINICAL DECISION MAKING: Stable/uncomplicated  EVALUATION COMPLEXITY: Low   GOALS: Goals reviewed with patient? Yes  SHORT TERM GOALS: Target date: 11/25/2022    Will be compliant with appropriate progressive HEP  Baseline: Goal status: MET 11/21/22- reports good compliance.   2.  Pain to be resolved, 0/10 Baseline:  Goal status: IN PROGRESS 11/21/22- no pain today, improving overall  3.  Will demonstrate improved awareness of postural mechanics with functional task performance  Baseline:  Goal status: IN PROGRESS 11/21/22 - trying   LONG TERM GOALS: Target date: 12/16/2022    MMT will improve by at least 1 grade in all weak groups  Baseline:  Goal status: IN PROGRESS  2.  Will be able to push/pull without restrictions at work without increase in pain  Baseline:  Goal status: IN PROGRESS  3.  Will be compliant in advanced HEP vs gym based exercise programming  Baseline:  Goal status: IN PROGRESS  4.  QuickDASH score to improve by at least 50% to show improvement in functional status  Baseline:  Goal status: IN PROGRESS    PLAN:  PT FREQUENCY: 2x/week  PT DURATION: 6 weeks  PLANNED INTERVENTIONS: Therapeutic exercises, Therapeutic activity, Neuromuscular re-education, Balance training, Gait training, Patient/Family education, Self Care, Joint mobilization, Dry Needling, Cryotherapy, Moist heat, Taping, Ionotophoresis 4mg /ml Dexamethasone, Manual therapy, and Re-evaluation  PLAN FOR NEXT SESSION: focus on functional strength/postural training, progress postural training in HEP.   Jena Gauss, PT,  DPT 11/21/2022 10:24 AM

## 2022-11-25 ENCOUNTER — Encounter: Payer: 59 | Admitting: Physical Therapy

## 2022-11-26 ENCOUNTER — Ambulatory Visit (INDEPENDENT_AMBULATORY_CARE_PROVIDER_SITE_OTHER): Payer: Medicare Other

## 2022-11-26 ENCOUNTER — Ambulatory Visit: Payer: Medicare Other | Admitting: Physical Therapy

## 2022-11-26 ENCOUNTER — Encounter: Payer: Self-pay | Admitting: Physical Therapy

## 2022-11-26 DIAGNOSIS — R293 Abnormal posture: Secondary | ICD-10-CM

## 2022-11-26 DIAGNOSIS — M6281 Muscle weakness (generalized): Secondary | ICD-10-CM

## 2022-11-26 DIAGNOSIS — M25512 Pain in left shoulder: Secondary | ICD-10-CM | POA: Diagnosis not present

## 2022-11-26 DIAGNOSIS — E538 Deficiency of other specified B group vitamins: Secondary | ICD-10-CM | POA: Diagnosis not present

## 2022-11-26 DIAGNOSIS — G8929 Other chronic pain: Secondary | ICD-10-CM

## 2022-11-26 MED ORDER — CYANOCOBALAMIN 1000 MCG/ML IJ SOLN
1000.0000 ug | Freq: Once | INTRAMUSCULAR | Status: AC
  Administered 2022-11-26: 1000 ug via INTRAMUSCULAR

## 2022-11-26 NOTE — Therapy (Signed)
OUTPATIENT PHYSICAL THERAPY SHOULDER TREATMENT   Patient Name: Chelsea Vega MRN: [id number] DOB:Jan 27, 1956, 67 y.o., female Today's Date: 11/26/2022  END OF SESSION:  PT End of Session - 11/26/22 1618     Visit Number 5    Number of Visits 13    Date for PT Re-Evaluation 12/16/22    Authorization Type U[employer] MCR    Authorization Time Period 11/04/22 to 12/16/22    Progress Note Due on Visit 10    PT Start Time 1617    PT Stop Time 1659    PT Time Calculation (min) 42 min    Activity Tolerance Patient tolerated treatment well    Behavior During Therapy [facility] for tasks assessed/performed              Past Medical History:  Diagnosis Date   Abnormal vaginal Pap smear    DISTANT HX- S/P LEEP   Allergic rhinitis    Allergy young child   Hay Fever   Anemia    pregnancy   Anxiety Nov. 2023   Hives have caused it to worsen   Arthritis 2006   Left ankle- chronic pain   Asthma    childhood, early 20's   Constipation    Depression    Dysrhythmia    occassional PVC's   Gallstone    GERD (gastroesophageal reflux disease)    Hx of cystitis    RECURRENT, DR. TANNENBAUM   Hyperlipidemia Sept. 2023   No need to treat   Insomnia    Osteoporosis 10/2008   DR. GREWAL, DEXA 10/2008   Postmenopausal    PVC's (premature ventricular contractions)    HX OF   Slow heart rate    Thyroid disease Nov. 2023   Elevated Thyroid antibodies   Tinnitus    Trapezius muscle spasm    Vitamin D deficiency    Past Surgical History:  Procedure Laterality Date   ANKLE SURGERY Right 05/2011   repair of tendon   ANTERIOR AND POSTERIOR REPAIR N/A 05/24/2015   Procedure: ANTERIOR (CYSTOCELE) AND POSTERIOR REPAIR (RECTOCELE);  Surgeon: Marcelle Overlie, MD;  Location: [facility];  Service: Gynecology;  Laterality: N/A;   BLEPHAROPLASTY  12/2021   BREAST CYST ASPIRATION     2005   COSMETIC SURGERY  June 2023   Blepharoplasty   DIAGNOSTIC LAPAROSCOPY  07/14/2010   removed tubes and ovaries    GANGLION CYST EXCISION Right 2005   right wrist   SEPTOPLASTY  2009   VAGINAL HYSTERECTOMY  05/24/2015   Procedure: HYSTERECTOMY VAGINAL;  Surgeon: Marcelle Overlie, MD;  Location: [facility];  Service: Gynecology;;  uterus and cervix only   Patient Active Problem List   Diagnosis Date Noted   Idiopathic urticaria 10/30/2022   Vitamin B6 deficiency 10/30/2022   Vitamin B12 deficiency 10/30/2022   Calculus of gallbladder without cholecystitis without obstruction 10/30/2022   Hashimoto's thyroiditis 10/29/2022   Prolapse of female pelvic organs 05/24/2015   Ankle sprain 03/06/2011    PCP: Jeani Sow MD   REFERRING PROVIDER: Sheral Apley, MD  REFERRING DIAG: M77.8 (ICD-10-CM) - Shoulder tendonitis, left  THERAPY DIAG:  Chronic left shoulder pain  Muscle weakness (generalized)  Abnormal posture  Rationale for Evaluation and Treatment: Rehabilitation  ONSET DATE: 10/15/2022  SUBJECTIVE:  SUBJECTIVE STATEMENT:  Saw Dr. Eulah Pont who told her she could taper her PT down and just do HEP, if pain returned could do another injection.  Has a little pain but nothing like what it was.  Did a lot of yard work Sunday afternoon which may have irritated it.   Hand dominance: Right  PERTINENT HISTORY:   PAIN:  Are you having pain? Yes: NPRS scale: 2-3/10 Pain location: L shoulder Pain description: sore Aggravating factors: reaching back for seatbelt  PRECAUTIONS: None  WEIGHT BEARING RESTRICTIONS: No  FALLS:  Has patient fallen in last 6 months? No  LIVING ENVIRONMENT: Lives with: lives with their spouse Lives in: House/apartment Stairs: no STE, flight inside but does not go up too often  Has following equipment at home: None  OCCUPATION: Part time nursing work   PLOF: Independent,  Independent with basic ADLs, Independent with gait, and Independent with transfers  PATIENT GOALS: get stronger, avoid another flare up   NEXT MD VISIT: Dr. Eulah Pont around May 15th   OBJECTIVE:   DIAGNOSTIC FINDINGS:    PATIENT SURVEYS:  Quick Dash 16  COGNITION: Overall cognitive status: Within functional limits for tasks assessed     SENSATION: Not tested  POSTURE: Flat lumbar and T spines, forward head with rounded shoulders   UPPER EXTREMITY ROM:   Active ROM Right eval Left eval  Shoulder flexion 138* 138*  Shoulder extension    Shoulder abduction 160* 160*  Shoulder adduction    Shoulder internal rotation FIR T7 FIR T7  Shoulder external rotation FER T4 FER T4   Elbow flexion    Elbow extension    Wrist flexion    Wrist extension    Wrist ulnar deviation    Wrist radial deviation    Wrist pronation    Wrist supination    (Blank rows = not tested)  UPPER EXTREMITY MMT:  MMT Right eval Left eval   Shoulder flexion 4+ 4   Shoulder extension 3 3   Shoulder abduction 4+ 3   Shoulder adduction     Shoulder internal rotation 4+ 4+   Shoulder external rotation 4- 3+   Middle trapezius 3+ 3+   Lower trapezius 2 2   Elbow flexion     Elbow extension     Wrist flexion     Wrist extension     Wrist ulnar deviation     Wrist radial deviation     Wrist pronation     Wrist supination     Grip strength (lbs)     (Blank rows = not tested)  SHOULDER SPECIAL TESTS:  Empty can test negative B    TODAY'S TREATMENT:                                                                                                                                         DATE:   11/26/2022 Therapeutic Exercise:  to improve strength and mobility.  Demo, verbal and tactile cues throughout for technique. UBE L4 x 6 min (x3 min f/b) Open book stretches s/L x 10 bil  On foam roller - back stroke for thoracic mobilization for shoulder ROM x 20, pec stretch  Manual Therapy: to  decrease muscle spasm and pain and improve mobility STM/TPR to R supraspinatus, R delts, skilled palpation and monitoring during dry needling. Trigger Point Dry-Needling  Treatment instructions: Expect mild to moderate muscle soreness. S/S of pneumothorax if dry needled over a lung field, and to seek immediate medical attention should they occur. Patient verbalized understanding of these instructions and education. Patient Consent Given: Yes Education handout provided: previously provided  Muscles treated: R supraspinatus, R posterior delt Electrical stimulation performed: No Parameters: N/A Treatment response/outcome: Twitch Response Elicited and Palpable Increase in Muscle Length  11/21/22 Therapeutic Exercise: to improve strength and mobility.  Demo, verbal and tactile cues throughout for technique. UBE L4 x 6 min (x3 min f/b) Standing rows green TB 2 x 10x3" Standing shoulder extension green TB 2 x 10x3" Standing B ER green TB 2 x 10x3" S/L L shld ER 1# 2 x 10  S/L L shld horiz ABD  1# 2 x 10 S/L L shld flexion  #1 2 x 10 Manual Therapy: to decrease muscle spasm and pain and improve mobility STM/TPR to R supraspinatus, skilled palpation and monitoring during dry needling. Trigger Point Dry-Needling  Treatment instructions: Expect mild to moderate muscle soreness. S/S of pneumothorax if dry needled over a lung field, and to seek immediate medical attention should they occur. Patient verbalized understanding of these instructions and education. Patient Consent Given: Yes Education handout provided: Yes Muscles treated: R supraspinatus Electrical stimulation performed: No Parameters: N/A Treatment response/outcome: Twitch Response Elicited and Palpable Increase in Muscle Length   11/18/22 UBE L4.0 3 min each way Standing rows green TB 10x3" Standing shoulder extension green TB 10x3" Standing B ER green TB 10x3" I,T,Y standing at doorframe 10x each direction Serratus slides up wall  with pillowcase 2x10 Seated rows high grip 2x10 20# Body blade arm to side small oscillation 2x for 10 seconds S/L L shld ER 2# x 10  S/L L shld abduction x 10 S/L L shld horiz ABD x 10 S/L L shld flexion x 10  11/11/22  TherEx  UBE L4 x3 min forward/3 min backwards Blackburn 6 (minus the Y exercises in prone due to pain) x10 each 0#  Shoulder Ys at wall palms flat 0# x10 Shoulder Ys at wall thumbs out 0# x10 Serratus punches 3# x10 supine Serratus shoulder circles CW/CCW 3# x10 each Serratus shoulder alphabet 3# x2  3 way reaches red TB x10 B Rhomboid shoulder flexion red TB x10  Shoulder flexion 3# back to wall x15 Shoulder ABD 3# R side to wall x15  Corner stretch 3x30 seconds Thoracic excursions x20 all directions   PATIENT EDUCATION: Education details: HEP updatePerson educated: Patient Education method: Programmer, multimedia, Facilities manager, Verbal cues, and Handouts Education comprehension: verbalized understanding, returned demonstration, and needs further education  HOME EXERCISE PROGRAM: Access Code: [email or web address] URL: [email or web address] Date: 11/26/2022 Prepared by: Harrie Foreman  Exercises - Scapular Retraction with Resistance  - 2 x daily - 7 x weekly - 1 sets - 12 reps - 3 hold - Shoulder extension with resistance - Neutral  - 2 x daily - 7 x weekly - 1 sets - 12 reps - 3 hold - Shoulder External Rotation and Scapular Retraction with Resistance  -  1 x daily - 7 x weekly - 3 sets - 10 reps - Sidelying Shoulder Flexion 15 Degrees  - 1 x daily - 7 x weekly - 3 sets - 10 reps - Sidelying Shoulder Abduction Palm Forward  - 1 x daily - 7 x weekly - 3 sets - 10 reps - Sidelying Shoulder External Rotation Dumbbell  - 1 x daily - 7 x weekly - 3 sets - 10 reps - Sidelying Open Book Thoracic Lumbar Rotation and Extension  - 1 x daily - 7 x weekly - 1 sets - 10 reps  Patient Education - Office Posture  ASSESSMENT:  CLINICAL IMPRESSION: Chelsea Vega reports  good response to TrDN last session, no soreness afterwards, did not have any soreness until yesterday after yardwork.  Today worked on exercises for thoracic mobilization to improved shoulder function, tolerated well followed by manual therapy.   Chelsea Vega continues to demonstrate potential for improvement and would benefit from continued skilled therapy to address impairments.    OBJECTIVE IMPAIRMENTS: decreased strength, increased fascial restrictions, impaired UE functional use, postural dysfunction, and pain.   ACTIVITY LIMITATIONS: carrying, lifting, sleeping, and reach over head  PARTICIPATION LIMITATIONS: cleaning, laundry, shopping, community activity, occupation, and yard work  PERSONAL FACTORS: Age, Behavior pattern, Education, Fitness, Past/current experiences, Profession, Sex, Social background, and Time since onset of injury/illness/exacerbation are also affecting patient's functional outcome.   REHAB POTENTIAL: Good  CLINICAL DECISION MAKING: Stable/uncomplicated  EVALUATION COMPLEXITY: Low   GOALS: Goals reviewed with patient? Yes  SHORT TERM GOALS: Target date: 11/25/2022    Will be compliant with appropriate progressive HEP  Baseline: Goal status: MET 11/21/22- reports good compliance.   2.  Pain to be resolved, 0/10 Baseline:  Goal status: IN PROGRESS 11/21/22- no pain today, improving overall  3.  Will demonstrate improved awareness of postural mechanics with functional task performance  Baseline:  Goal status: MET 11/21/22 - trying 11/26/22- met   LONG TERM GOALS: Target date: 12/16/2022    MMT will improve by at least 1 grade in all weak groups  Baseline:  Goal status: IN PROGRESS  2.  Will be able to push/pull without restrictions at work without increase in pain  Baseline:  Goal status: IN PROGRESS  3.  Will be compliant in advanced HEP vs gym based exercise programming  Baseline:  Goal status: IN PROGRESS  4.  QuickDASH score to improve by at  least 50% to show improvement in functional status  Baseline:  Goal status: IN PROGRESS    PLAN:  PT FREQUENCY: 2x/week  PT DURATION: 6 weeks  PLANNED INTERVENTIONS: Therapeutic exercises, Therapeutic activity, Neuromuscular re-education, Balance training, Gait training, Patient/Family education, Self Care, Joint mobilization, Dry Needling, Cryotherapy, Moist heat, Taping, Ionotophoresis 4mg /ml Dexamethasone, Manual therapy, and Re-evaluation  PLAN FOR NEXT SESSION: focus on functional strength/postural training, progress postural training in HEP.   Jena Gauss, PT, DPT 11/26/2022  5:00 PM

## 2022-11-26 NOTE — Progress Notes (Signed)
Pt here for B12 injection for Dr. Ruthine Dose, MD. Injection given in right deltoid. Pt tolerated well.

## 2022-11-28 ENCOUNTER — Ambulatory Visit: Payer: Medicare Other | Admitting: Physical Therapy

## 2022-11-28 ENCOUNTER — Encounter: Payer: Self-pay | Admitting: Physical Therapy

## 2022-11-28 DIAGNOSIS — M25512 Pain in left shoulder: Secondary | ICD-10-CM | POA: Diagnosis not present

## 2022-11-28 DIAGNOSIS — R293 Abnormal posture: Secondary | ICD-10-CM

## 2022-11-28 DIAGNOSIS — M6281 Muscle weakness (generalized): Secondary | ICD-10-CM

## 2022-11-28 DIAGNOSIS — G8929 Other chronic pain: Secondary | ICD-10-CM

## 2022-11-28 NOTE — Therapy (Signed)
OUTPATIENT PHYSICAL THERAPY SHOULDER TREATMENT   Patient Name: Chelsea Vega MRN: [id number] DOB:09/27/1955, 67 y.o., female Today's Date: 11/28/2022  END OF SESSION:  PT End of Session - 11/28/22 0850     Visit Number 6    Number of Visits 13    Date for PT Re-Evaluation 12/16/22    Authorization Type [employer] MCR    Authorization Time Period 11/04/22 to 12/16/22    Progress Note Due on Visit 10    PT Start Time 0849    PT Stop Time 0930    PT Time Calculation (min) 41 min    Activity Tolerance Patient tolerated treatment well    Behavior During Therapy [facility] for tasks assessed/performed              Past Medical History:  Diagnosis Date   Abnormal vaginal Pap smear    DISTANT HX- S/P LEEP   Allergic rhinitis    Allergy young child   Hay Fever   Anemia    pregnancy   Anxiety Nov. 2023   Hives have caused it to worsen   Arthritis 2006   Left ankle- chronic pain   Asthma    childhood, early 20's   Constipation    Depression    Dysrhythmia    occassional PVC's   Gallstone    GERD (gastroesophageal reflux disease)    Hx of cystitis    RECURRENT, DR. TANNENBAUM   Hyperlipidemia Sept. 2023   No need to treat   Insomnia    Osteoporosis 10/2008   DR. GREWAL, DEXA 10/2008   Postmenopausal    PVC's (premature ventricular contractions)    HX OF   Slow heart rate    Thyroid disease Nov. 2023   Elevated Thyroid antibodies   Tinnitus    Trapezius muscle spasm    Vitamin D deficiency    Past Surgical History:  Procedure Laterality Date   ANKLE SURGERY Right 05/2011   repair of tendon   ANTERIOR AND POSTERIOR REPAIR N/A 05/24/2015   Procedure: ANTERIOR (CYSTOCELE) AND POSTERIOR REPAIR (RECTOCELE);  Surgeon: Marcelle Overlie, MD;  Location: [facility];  Service: Gynecology;  Laterality: N/A;   BLEPHAROPLASTY  12/2021   BREAST CYST ASPIRATION     2005   COSMETIC SURGERY  June 2023   Blepharoplasty   DIAGNOSTIC LAPAROSCOPY  07/14/2010   removed tubes and ovaries    GANGLION CYST EXCISION Right 2005   right wrist   SEPTOPLASTY  2009   VAGINAL HYSTERECTOMY  05/24/2015   Procedure: HYSTERECTOMY VAGINAL;  Surgeon: Marcelle Overlie, MD;  Location: [facility];  Service: Gynecology;;  uterus and cervix only   Patient Active Problem List   Diagnosis Date Noted   Idiopathic urticaria 10/30/2022   Vitamin B6 deficiency 10/30/2022   Vitamin B12 deficiency 10/30/2022   Calculus of gallbladder without cholecystitis without obstruction 10/30/2022   Hashimoto's thyroiditis 10/29/2022   Prolapse of female pelvic organs 05/24/2015   Ankle sprain 03/06/2011    PCP: Jeani Sow MD   REFERRING PROVIDER: Sheral Apley, MD  REFERRING DIAG: M77.8 (ICD-10-CM) - Shoulder tendonitis, left  THERAPY DIAG:  Chronic left shoulder pain  Muscle weakness (generalized)  Abnormal posture  Rationale for Evaluation and Treatment: Rehabilitation  ONSET DATE: 10/15/2022  SUBJECTIVE:  SUBJECTIVE STATEMENT:  Feeling good today.   Hand dominance: Right  PERTINENT HISTORY:   PAIN:  Are you having pain? No  PRECAUTIONS: None  WEIGHT BEARING RESTRICTIONS: No  FALLS:  Has patient fallen in last 6 months? No  LIVING ENVIRONMENT: Lives with: lives with their spouse Lives in: House/apartment Stairs: no STE, flight inside but does not go up too often  Has following equipment at home: None  OCCUPATION: Part time nursing work   PLOF: Independent, Independent with basic ADLs, Independent with gait, and Independent with transfers  PATIENT GOALS: get stronger, avoid another flare up   NEXT MD VISIT: Dr. Eulah Pont around May 15th   OBJECTIVE:   DIAGNOSTIC FINDINGS:    PATIENT SURVEYS:  Quick Dash 16  COGNITION: Overall cognitive status: Within functional limits for tasks  assessed     SENSATION: Not tested  POSTURE: Flat lumbar and T spines, forward head with rounded shoulders   UPPER EXTREMITY ROM:   Active ROM Right eval Left eval  Shoulder flexion 138* 138*  Shoulder extension    Shoulder abduction 160* 160*  Shoulder adduction    Shoulder internal rotation FIR T7 FIR T7  Shoulder external rotation FER T4 FER T4   Elbow flexion    Elbow extension    Wrist flexion    Wrist extension    Wrist ulnar deviation    Wrist radial deviation    Wrist pronation    Wrist supination    (Blank rows = not tested)  UPPER EXTREMITY MMT:  MMT Right eval Left eval   Shoulder flexion 4+ 4   Shoulder extension 3 3   Shoulder abduction 4+ 3   Shoulder adduction     Shoulder internal rotation 4+ 4+   Shoulder external rotation 4- 3+   Middle trapezius 3+ 3+   Lower trapezius 2 2   Elbow flexion     Elbow extension     Wrist flexion     Wrist extension     Wrist ulnar deviation     Wrist radial deviation     Wrist pronation     Wrist supination     Grip strength (lbs)     (Blank rows = not tested)  SHOULDER SPECIAL TESTS:  Empty can test negative B    TODAY'S TREATMENT:                                                                                                                                         DATE:   11/28/2022 Therapeutic Exercise: to improve strength and mobility.  Demo, verbal and tactile cues throughout for technique. UBE L4 x 6 min (x3 min f/b) Open book stretches s/L x 10 bil  S/L Shoulder ER 2# 3 x 10 S/L shoulder Abduction 2# 2 x 10 LUE S/L shoulder flexion 2# 2 x 10 LUE Manual Therapy: to decrease muscle spasm and pain  and improve mobility IASTM to pecs and lats to improve shoulder mobility and tightness.     11/26/2022 Therapeutic Exercise: to improve strength and mobility.  Demo, verbal and tactile cues throughout for technique. UBE L4 x 6 min (x3 min f/b) Open book stretches s/L x 10 bil  On foam roller - back  stroke for thoracic mobilization for shoulder ROM x 20, pec stretch  Manual Therapy: to decrease muscle spasm and pain and improve mobility STM/TPR to R supraspinatus, R delts, skilled palpation and monitoring during dry needling. Trigger Point Dry-Needling  Treatment instructions: Expect mild to moderate muscle soreness. S/S of pneumothorax if dry needled over a lung field, and to seek immediate medical attention should they occur. Patient verbalized understanding of these instructions and education. Patient Consent Given: Yes Education handout provided: previously provided  Muscles treated: R supraspinatus, R posterior delt Electrical stimulation performed: No Parameters: N/A Treatment response/outcome: Twitch Response Elicited and Palpable Increase in Muscle Length  11/21/22 Therapeutic Exercise: to improve strength and mobility.  Demo, verbal and tactile cues throughout for technique. UBE L4 x 6 min (x3 min f/b) Standing rows green TB 2 x 10x3" Standing shoulder extension green TB 2 x 10x3" Standing B ER green TB 2 x 10x3" S/L L shld ER 1# 2 x 10  S/L L shld horiz ABD  1# 2 x 10 S/L L shld flexion  #1 2 x 10 Manual Therapy: to decrease muscle spasm and pain and improve mobility STM/TPR to R supraspinatus, skilled palpation and monitoring during dry needling. Trigger Point Dry-Needling  Treatment instructions: Expect mild to moderate muscle soreness. S/S of pneumothorax if dry needled over a lung field, and to seek immediate medical attention should they occur. Patient verbalized understanding of these instructions and education. Patient Consent Given: Yes Education handout provided: Yes Muscles treated: R supraspinatus Electrical stimulation performed: No Parameters: N/A Treatment response/outcome: Twitch Response Elicited and Palpable Increase in Muscle Length   11/18/22 UBE L4.0 3 min each way Standing rows green TB 10x3" Standing shoulder extension green TB 10x3" Standing B ER  green TB 10x3" I,T,Y standing at doorframe 10x each direction Serratus slides up wall with pillowcase 2x10 Seated rows high grip 2x10 20# Body blade arm to side small oscillation 2x for 10 seconds S/L L shld ER 2# x 10  S/L L shld abduction x 10 S/L L shld horiz ABD x 10 S/L L shld flexion x 10  11/11/22  TherEx  UBE L4 x3 min forward/3 min backwards Blackburn 6 (minus the Y exercises in prone due to pain) x10 each 0#  Shoulder Ys at wall palms flat 0# x10 Shoulder Ys at wall thumbs out 0# x10 Serratus punches 3# x10 supine Serratus shoulder circles CW/CCW 3# x10 each Serratus shoulder alphabet 3# x2  3 way reaches red TB x10 B Rhomboid shoulder flexion red TB x10  Shoulder flexion 3# back to wall x15 Shoulder ABD 3# R side to wall x15  Corner stretch 3x30 seconds Thoracic excursions x20 all directions   PATIENT EDUCATION: Education details: Scientist, product/process development Person educated: Patient Education method: Programmer, multimedia, Facilities manager, Verbal cues, and Handouts Education comprehension: verbalized understanding, returned demonstration, and needs further education  HOME EXERCISE PROGRAM: Access Code: [email or web address] URL: [email or web address] Date: 11/26/2022 Prepared by: Harrie Foreman  Exercises - Scapular Retraction with Resistance  - 2 x daily - 7 x weekly - 1 sets - 12 reps - 3 hold - Shoulder extension with resistance - Neutral  - 2 x daily -  7 x weekly - 1 sets - 12 reps - 3 hold - Shoulder External Rotation and Scapular Retraction with Resistance  - 1 x daily - 7 x weekly - 3 sets - 10 reps - Sidelying Shoulder Flexion 15 Degrees  - 1 x daily - 7 x weekly - 3 sets - 10 reps - Sidelying Shoulder Abduction Palm Forward  - 1 x daily - 7 x weekly - 3 sets - 10 reps - Sidelying Shoulder External Rotation Dumbbell  - 1 x daily - 7 x weekly - 3 sets - 10 reps - Sidelying Open Book Thoracic Lumbar Rotation and Extension  - 1 x daily - 7 x weekly - 1 sets - 10  reps  Patient Education - Office Posture  ASSESSMENT:  CLINICAL IMPRESSION: Chelsea Vega reports shoulder is doing well.  Today reveiwed S/L exercises, some corrections needed for proper form.  Noted significant tightness in pecs and lats, demonstrated pec stretch in doorway to help.    Chelsea Vega continues to demonstrate potential for improvement and would benefit from continued skilled therapy to address impairments.    OBJECTIVE IMPAIRMENTS: decreased strength, increased fascial restrictions, impaired UE functional use, postural dysfunction, and pain.   ACTIVITY LIMITATIONS: carrying, lifting, sleeping, and reach over head  PARTICIPATION LIMITATIONS: cleaning, laundry, shopping, community activity, occupation, and yard work  PERSONAL FACTORS: Age, Behavior pattern, Education, Fitness, Past/current experiences, Profession, Sex, Social background, and Time since onset of injury/illness/exacerbation are also affecting patient's functional outcome.   REHAB POTENTIAL: Good  CLINICAL DECISION MAKING: Stable/uncomplicated  EVALUATION COMPLEXITY: Low   GOALS: Goals reviewed with patient? Yes  SHORT TERM GOALS: Target date: 11/25/2022    Will be compliant with appropriate progressive HEP  Baseline: Goal status: MET 11/21/22- reports good compliance.   2.  Pain to be resolved, 0/10 Baseline:  Goal status: IN PROGRESS 11/21/22- no pain today, improving overall  3.  Will demonstrate improved awareness of postural mechanics with functional task performance  Baseline:  Goal status: MET 11/21/22 - trying 11/26/22- met   LONG TERM GOALS: Target date: 12/16/2022    MMT will improve by at least 1 grade in all weak groups  Baseline:  Goal status: IN PROGRESS  2.  Will be able to push/pull without restrictions at work without increase in pain  Baseline:  Goal status: IN PROGRESS  3.  Will be compliant in advanced HEP vs gym based exercise programming  Baseline:  Goal status: IN  PROGRESS  4.  QuickDASH score to improve by at least 50% to show improvement in functional status  Baseline:  Goal status: IN PROGRESS    PLAN:  PT FREQUENCY: 2x/week  PT DURATION: 6 weeks  PLANNED INTERVENTIONS: Therapeutic exercises, Therapeutic activity, Neuromuscular re-education, Balance training, Gait training, Patient/Family education, Self Care, Joint mobilization, Dry Needling, Cryotherapy, Moist heat, Taping, Ionotophoresis 4mg /ml Dexamethasone, Manual therapy, and Re-evaluation  PLAN FOR NEXT SESSION: focus on functional strength/postural training, progress postural training in HEP.   Jena Gauss, PT, DPT 11/28/2022  9:37 AM

## 2022-12-02 ENCOUNTER — Encounter: Payer: 59 | Admitting: Physical Therapy

## 2022-12-05 ENCOUNTER — Ambulatory Visit (INDEPENDENT_AMBULATORY_CARE_PROVIDER_SITE_OTHER): Payer: Medicare Other | Admitting: *Deleted

## 2022-12-05 DIAGNOSIS — E538 Deficiency of other specified B group vitamins: Secondary | ICD-10-CM

## 2022-12-05 MED ORDER — CYANOCOBALAMIN 1000 MCG/ML IJ SOLN
1000.0000 ug | Freq: Once | INTRAMUSCULAR | Status: AC
  Administered 2022-12-05: 1000 ug via INTRAMUSCULAR

## 2022-12-05 NOTE — Progress Notes (Signed)
Per orders of Dr. Kulik, injection of B 12 given in right deltoid per patient preference by Orville Widmann S Phillips Goulette, CMA. Patient tolerated injection well. Patient reminded to schedule next injection.   

## 2022-12-09 ENCOUNTER — Encounter: Payer: Self-pay | Admitting: Family Medicine

## 2022-12-10 ENCOUNTER — Ambulatory Visit: Payer: Medicare Other | Admitting: Physical Therapy

## 2022-12-10 ENCOUNTER — Encounter: Payer: Self-pay | Admitting: Physical Therapy

## 2022-12-10 DIAGNOSIS — R293 Abnormal posture: Secondary | ICD-10-CM

## 2022-12-10 DIAGNOSIS — M25512 Pain in left shoulder: Secondary | ICD-10-CM | POA: Diagnosis not present

## 2022-12-10 DIAGNOSIS — G8929 Other chronic pain: Secondary | ICD-10-CM

## 2022-12-10 DIAGNOSIS — M6281 Muscle weakness (generalized): Secondary | ICD-10-CM

## 2022-12-10 NOTE — Therapy (Signed)
OUTPATIENT PHYSICAL THERAPY SHOULDER TREATMENT   Patient Name: Chelsea Vega MRN: [id number] DOB:07-02-1956, 67 y.o., female Today's Date: 12/10/2022  END OF SESSION:  PT End of Session - 12/10/22 1024     Visit Number 7    Number of Visits 13    Date for PT Re-Evaluation 12/16/22    Authorization Type [employer] MCR    Authorization Time Period 11/04/22 to 12/16/22    Progress Note Due on Visit 10    PT Start Time 1022    PT Stop Time 1100    PT Time Calculation (min) 38 min    Activity Tolerance Patient tolerated treatment well    Behavior During Therapy [facility] for tasks assessed/performed              Past Medical History:  Diagnosis Date   Abnormal vaginal Pap smear    DISTANT HX- S/P LEEP   Allergic rhinitis    Allergy young child   Hay Fever   Anemia    pregnancy   Anxiety Nov. 2023   Hives have caused it to worsen   Arthritis 2006   Left ankle- chronic pain   Asthma    childhood, early 20's   Constipation    Depression    Dysrhythmia    occassional PVC's   Gallstone    GERD (gastroesophageal reflux disease)    Hx of cystitis    RECURRENT, DR. TANNENBAUM   Hyperlipidemia Sept. 2023   No need to treat   Insomnia    Osteoporosis 10/2008   DR. GREWAL, DEXA 10/2008   Postmenopausal    PVC's (premature ventricular contractions)    HX OF   Slow heart rate    Thyroid disease Nov. 2023   Elevated Thyroid antibodies   Tinnitus    Trapezius muscle spasm    Vitamin D deficiency    Past Surgical History:  Procedure Laterality Date   ANKLE SURGERY Right 05/2011   repair of tendon   ANTERIOR AND POSTERIOR REPAIR N/A 05/24/2015   Procedure: ANTERIOR (CYSTOCELE) AND POSTERIOR REPAIR (RECTOCELE);  Surgeon: Marcelle Overlie, MD;  Location: [facility];  Service: Gynecology;  Laterality: N/A;   BLEPHAROPLASTY  12/2021   BREAST CYST ASPIRATION     2005   COSMETIC SURGERY  June 2023   Blepharoplasty   DIAGNOSTIC LAPAROSCOPY  07/14/2010   removed tubes and ovaries    GANGLION CYST EXCISION Right 2005   right wrist   SEPTOPLASTY  2009   VAGINAL HYSTERECTOMY  05/24/2015   Procedure: HYSTERECTOMY VAGINAL;  Surgeon: Marcelle Overlie, MD;  Location: [facility];  Service: Gynecology;;  uterus and cervix only   Patient Active Problem List   Diagnosis Date Noted   Idiopathic urticaria 10/30/2022   Vitamin B6 deficiency 10/30/2022   Vitamin B12 deficiency 10/30/2022   Calculus of gallbladder without cholecystitis without obstruction 10/30/2022   Hashimoto's thyroiditis 10/29/2022   Prolapse of female pelvic organs 05/24/2015   Ankle sprain 03/06/2011    PCP: Jeani Sow MD   REFERRING PROVIDER: Sheral Apley, MD  REFERRING DIAG: M77.8 (ICD-10-CM) - Shoulder tendonitis, left  THERAPY DIAG:  Chronic left shoulder pain  Muscle weakness (generalized)  Abnormal posture  Rationale for Evaluation and Treatment: Rehabilitation  ONSET DATE: 10/15/2022  SUBJECTIVE:  SUBJECTIVE STATEMENT:  Was a little flared up after last session for a couple of days, but then settled down.  Has been doing a lot of yard work.   Hand dominance: Right  PERTINENT HISTORY:   PAIN:  Are you having pain? No just pulling/tight  PRECAUTIONS: None  WEIGHT BEARING RESTRICTIONS: No  FALLS:  Has patient fallen in last 6 months? No  LIVING ENVIRONMENT: Lives with: lives with their spouse Lives in: House/apartment Stairs: no STE, flight inside but does not go up too often  Has following equipment at home: None  OCCUPATION: Part time nursing work   PLOF: Independent, Independent with basic ADLs, Independent with gait, and Independent with transfers  PATIENT GOALS: get stronger, avoid another flare up   NEXT MD VISIT: Dr. Eulah Pont around May 15th   OBJECTIVE:   DIAGNOSTIC  FINDINGS:    PATIENT SURVEYS:  Quick Dash 16  COGNITION: Overall cognitive status: Within functional limits for tasks assessed     SENSATION: Not tested  POSTURE: Flat lumbar and T spines, forward head with rounded shoulders   UPPER EXTREMITY ROM:   Active ROM Right eval Left eval  Shoulder flexion 138* 138*  Shoulder extension    Shoulder abduction 160* 160*  Shoulder adduction    Shoulder internal rotation FIR T7 FIR T7  Shoulder external rotation FER T4 FER T4   Elbow flexion    Elbow extension    Wrist flexion    Wrist extension    Wrist ulnar deviation    Wrist radial deviation    Wrist pronation    Wrist supination    (Blank rows = not tested)  UPPER EXTREMITY MMT:  MMT Right eval Left eval   Shoulder flexion 4+ 4   Shoulder extension 3 3   Shoulder abduction 4+ 3   Shoulder adduction     Shoulder internal rotation 4+ 4+   Shoulder external rotation 4- 3+   Middle trapezius 3+ 3+   Lower trapezius 2 2   Elbow flexion     Elbow extension     Wrist flexion     Wrist extension     Wrist ulnar deviation     Wrist radial deviation     Wrist pronation     Wrist supination     Grip strength (lbs)     (Blank rows = not tested)  SHOULDER SPECIAL TESTS:  Empty can test negative B    TODAY'S TREATMENT:                                                                                                                                         DATE:  12/10/2022 Therapeutic Exercise: to improve strength and mobility.  Demo, verbal and tactile cues throughout for technique. UBE L4 x 6 min (82f/3b) Standing shoulder extension GTB 2 x 10 Standing rows GTB 2 x 10  Standing bil shoulder  ER 2 x 10 Standing shoulder horizontal abduction 2 x 10  Standing shoulder dislocates with GTB x 10 - tightness in L shoulder Levator scapulae stretch (L side) 2 x 30 sec  Manual Therapy: to decrease muscle spasm and pain and improve mobility S/L mobilizaiton to L scapula,  subscap release, TPR to L levator  11/28/2022 Therapeutic Exercise: to improve strength and mobility.  Demo, verbal and tactile cues throughout for technique. UBE L4 x 6 min (x3 min f/b) Open book stretches s/L x 10 bil  S/L Shoulder ER 2# 3 x 10 S/L shoulder Abduction 2# 2 x 10 LUE S/L shoulder flexion 2# 2 x 10 LUE Manual Therapy: to decrease muscle spasm and pain and improve mobility IASTM to pecs and lats to improve shoulder mobility and tightness.     11/26/2022 Therapeutic Exercise: to improve strength and mobility.  Demo, verbal and tactile cues throughout for technique. UBE L4 x 6 min (x3 min f/b) Open book stretches s/L x 10 bil  On foam roller - back stroke for thoracic mobilization for shoulder ROM x 20, pec stretch  Manual Therapy: to decrease muscle spasm and pain and improve mobility STM/TPR to R supraspinatus, R delts, skilled palpation and monitoring during dry needling. Trigger Point Dry-Needling  Treatment instructions: Expect mild to moderate muscle soreness. S/S of pneumothorax if dry needled over a lung field, and to seek immediate medical attention should they occur. Patient verbalized understanding of these instructions and education. Patient Consent Given: Yes Education handout provided: previously provided  Muscles treated: R supraspinatus, R posterior delt Electrical stimulation performed: No Parameters: N/A Treatment response/outcome: Twitch Response Elicited and Palpable Increase in Muscle Length  11/21/22 Therapeutic Exercise: to improve strength and mobility.  Demo, verbal and tactile cues throughout for technique. UBE L4 x 6 min (x3 min f/b) Standing rows green TB 2 x 10x3" Standing shoulder extension green TB 2 x 10x3" Standing B ER green TB 2 x 10x3" S/L L shld ER 1# 2 x 10  S/L L shld horiz ABD  1# 2 x 10 S/L L shld flexion  #1 2 x 10 Manual Therapy: to decrease muscle spasm and pain and improve mobility STM/TPR to R supraspinatus, skilled palpation  and monitoring during dry needling. Trigger Point Dry-Needling  Treatment instructions: Expect mild to moderate muscle soreness. S/S of pneumothorax if dry needled over a lung field, and to seek immediate medical attention should they occur. Patient verbalized understanding of these instructions and education. Patient Consent Given: Yes Education handout provided: Yes Muscles treated: R supraspinatus Electrical stimulation performed: No Parameters: N/A Treatment response/outcome: Twitch Response Elicited and Palpable Increase in Muscle Length   11/18/22 UBE L4.0 3 min each way Standing rows green TB 10x3" Standing shoulder extension green TB 10x3" Standing B ER green TB 10x3" I,T,Y standing at doorframe 10x each direction Serratus slides up wall with pillowcase 2x10 Seated rows high grip 2x10 20# Body blade arm to side small oscillation 2x for 10 seconds S/L L shld ER 2# x 10  S/L L shld abduction x 10 S/L L shld horiz ABD x 10 S/L L shld flexion x 10  11/11/22  TherEx  UBE L4 x3 min forward/3 min backwards [place] 6 (minus the Y exercises in prone due to pain) x10 each 0#  Shoulder Ys at wall palms flat 0# x10 Shoulder Ys at wall thumbs out 0# x10 Serratus punches 3# x10 supine Serratus shoulder circles CW/CCW 3# x10 each Serratus shoulder alphabet 3# x2  3 way reaches red TB x10 B Rhomboid shoulder flexion red TB x10  Shoulder flexion 3# back to wall x15 Shoulder ABD 3# R side to wall x15  Corner stretch 3x30 seconds Thoracic excursions x20 all directions   PATIENT EDUCATION: Education details: Scientist, product/process development Person educated: Patient Education method: Programmer, multimedia, Facilities manager, Verbal cues, and Handouts Education comprehension: verbalized understanding, returned demonstration, and needs further education  HOME EXERCISE PROGRAM: Access Code: [email or web address] URL: [email or web address] Date: 11/26/2022 Prepared by: Harrie Foreman  Exercises - Scapular  Retraction with Resistance  - 2 x daily - 7 x weekly - 1 sets - 12 reps - 3 hold - Shoulder extension with resistance - Neutral  - 2 x daily - 7 x weekly - 1 sets - 12 reps - 3 hold - Shoulder External Rotation and Scapular Retraction with Resistance  - 1 x daily - 7 x weekly - 3 sets - 10 reps - Sidelying Shoulder Flexion 15 Degrees  - 1 x daily - 7 x weekly - 3 sets - 10 reps - Sidelying Shoulder Abduction Palm Forward  - 1 x daily - 7 x weekly - 3 sets - 10 reps - Sidelying Shoulder External Rotation Dumbbell  - 1 x daily - 7 x weekly - 3 sets - 10 reps - Sidelying Open Book Thoracic Lumbar Rotation and Extension  - 1 x daily - 7 x weekly - 1 sets - 10 reps  Patient Education - Office Posture  ASSESSMENT:  CLINICAL IMPRESSION: MASAE COMI continues to report some tightness in L shoulder and pulling in posterior shoulder, noted large trigger point in L levator scapulae addressed with manual therapy today.   Chelsea Vega continues to demonstrate potential for improvement and would benefit from continued skilled therapy to address impairments.    OBJECTIVE IMPAIRMENTS: decreased strength, increased fascial restrictions, impaired UE functional use, postural dysfunction, and pain.   ACTIVITY LIMITATIONS: carrying, lifting, sleeping, and reach over head  PARTICIPATION LIMITATIONS: cleaning, laundry, shopping, community activity, occupation, and yard work  PERSONAL FACTORS: Age, Behavior pattern, Education, Fitness, Past/current experiences, Profession, Sex, Social background, and Time since onset of injury/illness/exacerbation are also affecting patient's functional outcome.   REHAB POTENTIAL: Good  CLINICAL DECISION MAKING: Stable/uncomplicated  EVALUATION COMPLEXITY: Low   GOALS: Goals reviewed with patient? Yes  SHORT TERM GOALS: Target date: 11/25/2022    Will be compliant with appropriate progressive HEP  Baseline: Goal status: MET 11/21/22- reports good compliance.   2.   Pain to be resolved, 0/10 Baseline:  Goal status: IN PROGRESS 11/21/22- no pain today, improving overall  3.  Will demonstrate improved awareness of postural mechanics with functional task performance  Baseline:  Goal status: MET 11/21/22 - trying 11/26/22- met   LONG TERM GOALS: Target date: 12/16/2022    MMT will improve by at least 1 grade in all weak groups  Baseline:  Goal status: IN PROGRESS  2.  Will be able to push/pull without restrictions at work without increase in pain  Baseline:  Goal status: IN PROGRESS  3.  Will be compliant in advanced HEP vs gym based exercise programming  Baseline:  Goal status: IN PROGRESS  4.  QuickDASH score to improve by at least 50% to show improvement in functional status  Baseline:  Goal status: IN PROGRESS    PLAN:  PT FREQUENCY: 2x/week  PT DURATION: 6 weeks  PLANNED INTERVENTIONS: Therapeutic exercises, Therapeutic activity, Neuromuscular re-education, Balance training, Gait training, Patient/Family education, Self Care, Joint mobilization, Dry Needling,  Cryotherapy, Moist heat, Taping, Ionotophoresis 4mg /ml Dexamethasone, Manual therapy, and Re-evaluation  PLAN FOR NEXT SESSION: focus on functional strength/postural training, progress postural training in HEP.   Jena Gauss, PT, DPT 12/10/2022  12:15 PM

## 2022-12-15 ENCOUNTER — Encounter: Payer: Self-pay | Admitting: Physical Therapy

## 2022-12-15 ENCOUNTER — Ambulatory Visit: Payer: Medicare Other | Attending: Orthopedic Surgery | Admitting: Physical Therapy

## 2022-12-15 DIAGNOSIS — M6281 Muscle weakness (generalized): Secondary | ICD-10-CM | POA: Diagnosis present

## 2022-12-15 DIAGNOSIS — R293 Abnormal posture: Secondary | ICD-10-CM | POA: Diagnosis present

## 2022-12-15 DIAGNOSIS — G8929 Other chronic pain: Secondary | ICD-10-CM | POA: Diagnosis present

## 2022-12-15 DIAGNOSIS — M25512 Pain in left shoulder: Secondary | ICD-10-CM | POA: Insufficient documentation

## 2022-12-15 NOTE — Therapy (Addendum)
OUTPATIENT PHYSICAL THERAPY SHOULDER TREATMENT Progress Note Reporting Period 11/04/22 to 12/15/22  See note below for Objective Data and Assessment of Progress/Goals.      Patient Name: Chelsea Vega MRN: [id number] DOB:[birth year], 67 y.o., female Today's Date: 12/15/2022  END OF SESSION:  PT End of Session - 12/15/22 1020     Visit Number 8    Number of Visits 13    Date for PT Re-Evaluation 12/16/22    Authorization Type [employer] MCR    Authorization Time Period 11/04/22 to 12/16/22    Progress Note Due on Visit 10    PT Start Time 1019    PT Stop Time 1101    PT Time Calculation (min) 42 min    Activity Tolerance Patient tolerated treatment well    Behavior During Therapy [facility] for tasks assessed/performed              Past Medical History:  Diagnosis Date   Abnormal vaginal Pap smear    DISTANT HX- S/P LEEP   Allergic rhinitis    Allergy young child   Hay Fever   Anemia    pregnancy   Anxiety Nov. 2023   Hives have caused it to worsen   Arthritis 2006   Left ankle- chronic pain   Asthma    childhood, early 20's   Constipation    Depression    Dysrhythmia    occassional PVC's   Gallstone    GERD (gastroesophageal reflux disease)    Hx of cystitis    RECURRENT, DR. TANNENBAUM   Hyperlipidemia Sept. 2023   No need to treat   Insomnia    Osteoporosis 10/2008   DR. GREWAL, DEXA 10/2008   Postmenopausal    PVC's (premature ventricular contractions)    HX OF   Slow heart rate    Thyroid disease Nov. 2023   Elevated Thyroid antibodies   Tinnitus    Trapezius muscle spasm    Vitamin D deficiency    Past Surgical History:  Procedure Laterality Date   ANKLE SURGERY Right 05/2011   repair of tendon   ANTERIOR AND POSTERIOR REPAIR N/A 05/24/2015   Procedure: ANTERIOR (CYSTOCELE) AND POSTERIOR REPAIR (RECTOCELE);  Surgeon: Marcelle Overlie, MD;  Location: [facility];  Service: Gynecology;  Laterality: N/A;   BLEPHAROPLASTY  12/2021   BREAST CYST ASPIRATION      2005   COSMETIC SURGERY  June 2023   Blepharoplasty   DIAGNOSTIC LAPAROSCOPY  07/14/2010   removed tubes and ovaries   GANGLION CYST EXCISION Right 2005   right wrist   SEPTOPLASTY  2009   VAGINAL HYSTERECTOMY  05/24/2015   Procedure: HYSTERECTOMY VAGINAL;  Surgeon: Marcelle Overlie, MD;  Location: [facility];  Service: Gynecology;;  uterus and cervix only   Patient Active Problem List   Diagnosis Date Noted   Idiopathic urticaria 10/30/2022   Vitamin B6 deficiency 10/30/2022   Vitamin B12 deficiency 10/30/2022   Calculus of gallbladder without cholecystitis without obstruction 10/30/2022   Hashimoto's thyroiditis 10/29/2022   Prolapse of female pelvic organs 05/24/2015   Ankle sprain 03/06/2011    PCP: Jeani Sow MD   REFERRING PROVIDER: Sheral Apley, MD  REFERRING DIAG: M77.8 (ICD-10-CM) - Shoulder tendonitis, left  THERAPY DIAG:  Chronic left shoulder pain  Muscle weakness (generalized)  Abnormal posture  Rationale for Evaluation and Treatment: Rehabilitation  ONSET DATE: 10/15/2022  SUBJECTIVE:  SUBJECTIVE STATEMENT:  Today is good, couple days ago had some pain/tightness in L pectoralis.   Hand dominance: Right  PERTINENT HISTORY:   PAIN:  Are you having pain? No just pulling/tight  PRECAUTIONS: None  WEIGHT BEARING RESTRICTIONS: No  FALLS:  Has patient fallen in last 6 months? No  LIVING ENVIRONMENT: Lives with: lives with their spouse Lives in: House/apartment Stairs: no STE, flight inside but does not go up too often  Has following equipment at home: None  OCCUPATION: Part time nursing work   PLOF: Independent, Independent with basic ADLs, Independent with gait, and Independent with transfers  PATIENT GOALS: get stronger, avoid another flare up   NEXT MD  VISIT: Dr. Eulah Pont around May 15th   OBJECTIVE:   DIAGNOSTIC FINDINGS:    PATIENT SURVEYS:  Quick Dash 16  COGNITION: Overall cognitive status: Within functional limits for tasks assessed     SENSATION: Not tested  POSTURE: Flat lumbar and T spines, forward head with rounded shoulders   UPPER EXTREMITY ROM:   Active ROM Right eval Left eval  Shoulder flexion 138* 138*  Shoulder extension    Shoulder abduction 160* 160*  Shoulder adduction    Shoulder internal rotation FIR T7 FIR T7  Shoulder external rotation FER T4 FER T4   Elbow flexion    Elbow extension    Wrist flexion    Wrist extension    Wrist ulnar deviation    Wrist radial deviation    Wrist pronation    Wrist supination    (Blank rows = not tested)  UPPER EXTREMITY MMT:  MMT Right eval Left eval Right 12/15/22 Left 12/15/22  Shoulder flexion 4+ 4 5 5   Shoulder extension 3 3 4+ 4+  Shoulder abduction 4+ 3 5 5   Shoulder adduction      Shoulder internal rotation 4+ 4+ 5 5  Shoulder external rotation 4- 3+ 5 5  Middle trapezius 3+ 3+ 4+ 4+  Lower trapezius 2 2 4+ 4+  (Blank rows = not tested)  SHOULDER SPECIAL TESTS:  Empty can test negative B    TODAY'S TREATMENT:                                                                                                                                         DATE:  12/15/2022 Therapeutic Exercise: to improve strength and mobility.  Demo, verbal and tactile cues throughout for technique. UBE L4 x 6 min (39f/3b) Review of HEP Demo of resistance equipment, including trial of Lat pull downs, demo of chest press, leg curls, leg press, rows.  Discussion of aquatic programs and [employer], yoga.  Information given.  Therapeutic Activity:  review of goals and progress, quickDash   12/10/2022 Therapeutic Exercise: to improve strength and mobility.  Demo, verbal and tactile cues throughout for technique. UBE L4 x 6 min (2f/3b) Standing shoulder extension GTB 2 x  10  Standing rows GTB 2 x 10  Standing bil shoulder ER 2 x 10 Standing shoulder horizontal abduction 2 x 10  Standing shoulder dislocates with GTB x 10 - tightness in L shoulder Levator scapulae stretch (L side) 2 x 30 sec  Manual Therapy: to decrease muscle spasm and pain and improve mobility S/L mobilizaiton to L scapula, subscap release, TPR to L levator  11/28/2022 Therapeutic Exercise: to improve strength and mobility.  Demo, verbal and tactile cues throughout for technique. UBE L4 x 6 min (x3 min f/b) Open book stretches s/L x 10 bil  S/L Shoulder ER 2# 3 x 10 S/L shoulder Abduction 2# 2 x 10 LUE S/L shoulder flexion 2# 2 x 10 LUE Manual Therapy: to decrease muscle spasm and pain and improve mobility IASTM to pecs and lats to improve shoulder mobility and tightness.     11/26/2022 Therapeutic Exercise: to improve strength and mobility.  Demo, verbal and tactile cues throughout for technique. UBE L4 x 6 min (x3 min f/b) Open book stretches s/L x 10 bil  On foam roller - back stroke for thoracic mobilization for shoulder ROM x 20, pec stretch  Manual Therapy: to decrease muscle spasm and pain and improve mobility STM/TPR to R supraspinatus, R delts, skilled palpation and monitoring during dry needling. Trigger Point Dry-Needling  Treatment instructions: Expect mild to moderate muscle soreness. S/S of pneumothorax if dry needled over a lung field, and to seek immediate medical attention should they occur. Patient verbalized understanding of these instructions and education. Patient Consent Given: Yes Education handout provided: previously provided  Muscles treated: R supraspinatus, R posterior delt Electrical stimulation performed: No Parameters: N/A Treatment response/outcome: Twitch Response Elicited and Palpable Increase in Muscle Length  11/21/22 Therapeutic Exercise: to improve strength and mobility.  Demo, verbal and tactile cues throughout for technique. UBE L4 x 6 min (x3  min f/b) Standing rows green TB 2 x 10x3" Standing shoulder extension green TB 2 x 10x3" Standing B ER green TB 2 x 10x3" S/L L shld ER 1# 2 x 10  S/L L shld horiz ABD  1# 2 x 10 S/L L shld flexion  #1 2 x 10 Manual Therapy: to decrease muscle spasm and pain and improve mobility STM/TPR to R supraspinatus, skilled palpation and monitoring during dry needling. Trigger Point Dry-Needling  Treatment instructions: Expect mild to moderate muscle soreness. S/S of pneumothorax if dry needled over a lung field, and to seek immediate medical attention should they occur. Patient verbalized understanding of these instructions and education. Patient Consent Given: Yes Education handout provided: Yes Muscles treated: R supraspinatus Electrical stimulation performed: No Parameters: N/A Treatment response/outcome: Twitch Response Elicited and Palpable Increase in Muscle Length   11/18/22 UBE L4.0 3 min each way Standing rows green TB 10x3" Standing shoulder extension green TB 10x3" Standing B ER green TB 10x3" I,T,Y standing at doorframe 10x each direction Serratus slides up wall with pillowcase 2x10 Seated rows high grip 2x10 20# Body blade arm to side small oscillation 2x for 10 seconds S/L L shld ER 2# x 10  S/L L shld abduction x 10 S/L L shld horiz ABD x 10 S/L L shld flexion x 10  11/11/22  TherEx  UBE L4 x3 min forward/3 min backwards [place] 6 (minus the Y exercises in prone due to pain) x10 each 0#  Shoulder Ys at wall palms flat 0# x10 Shoulder Ys at wall thumbs out 0# x10 Serratus punches 3# x10 supine Serratus shoulder circles CW/CCW  3# x10 each Serratus shoulder alphabet 3# x2  3 way reaches red TB x10 B Rhomboid shoulder flexion red TB x10  Shoulder flexion 3# back to wall x15 Shoulder ABD 3# R side to wall x15  Corner stretch 3x30 seconds Thoracic excursions x20 all directions   PATIENT EDUCATION: Education details: Scientist, product/process development Person educated:  Patient Education method: Programmer, multimedia, Facilities manager, Verbal cues, and Handouts Education comprehension: verbalized understanding, returned demonstration, and needs further education  HOME EXERCISE PROGRAM: Access Code: [email or web address] URL: [email or web address] Date: 11/26/2022 Prepared by: Harrie Foreman  Exercises - Scapular Retraction with Resistance  - 2 x daily - 7 x weekly - 1 sets - 12 reps - 3 hold - Shoulder extension with resistance - Neutral  - 2 x daily - 7 x weekly - 1 sets - 12 reps - 3 hold - Shoulder External Rotation and Scapular Retraction with Resistance  - 1 x daily - 7 x weekly - 3 sets - 10 reps - Sidelying Shoulder Flexion 15 Degrees  - 1 x daily - 7 x weekly - 3 sets - 10 reps - Sidelying Shoulder Abduction Palm Forward  - 1 x daily - 7 x weekly - 3 sets - 10 reps - Sidelying Shoulder External Rotation Dumbbell  - 1 x daily - 7 x weekly - 3 sets - 10 reps - Sidelying Open Book Thoracic Lumbar Rotation and Extension  - 1 x daily - 7 x weekly - 1 sets - 10 reps  Patient Education - Office Posture  ASSESSMENT:  CLINICAL IMPRESSION: Chelsea Vega has made good progress and met all goals.  She still has occasional pain/tightness, more in pecs, reviewed HEP and stretches, but today focused more on community programs and how to continue progress on own and add resistance training.  She would like to be placed on 30 day hold at this time.   OBJECTIVE IMPAIRMENTS: decreased strength, increased fascial restrictions, impaired UE functional use, postural dysfunction, and pain.   ACTIVITY LIMITATIONS: carrying, lifting, sleeping, and reach over head  PARTICIPATION LIMITATIONS: cleaning, laundry, shopping, community activity, occupation, and yard work  PERSONAL FACTORS: Age, Behavior pattern, Education, Fitness, Past/current experiences, Profession, Sex, Social background, and Time since onset of injury/illness/exacerbation are also affecting patient's functional  outcome.   REHAB POTENTIAL: Good  CLINICAL DECISION MAKING: Stable/uncomplicated  EVALUATION COMPLEXITY: Low   GOALS: Goals reviewed with patient? Yes  SHORT TERM GOALS: Target date: 11/25/2022    Will be compliant with appropriate progressive HEP  Baseline: Goal status: MET 11/21/22- reports good compliance.   2.  Pain to be resolved, 0/10 Baseline:  Goal status: MET 11/21/22- no pain today, improving overall  3.  Will demonstrate improved awareness of postural mechanics with functional task performance  Baseline:  Goal status: MET  11/26/22- met   LONG TERM GOALS: Target date: 12/16/2022    MMT will improve by at least 1 grade in all weak groups  Baseline:  Goal status: MET 12/15/22 - see objective   2.  Will be able to push/pull without restrictions at work without increase in pain  Baseline:  Goal status: MET 12/15/22  3.  Will be compliant in advanced HEP vs gym based exercise programming  Baseline:  Goal status: MET 12/15/22  4.  QuickDASH score to improve by at least 50% to show improvement in functional status  Baseline: 16% Goal status: MET 6.8% 12/15/22    PLAN:  PT FREQUENCY: 2x/week  PT DURATION: 6 weeks  PLANNED INTERVENTIONS: Therapeutic exercises,  Therapeutic activity, Neuromuscular re-education, Balance training, Gait training, Patient/Family education, Self Care, Joint mobilization, Dry Needling, Cryotherapy, Moist heat, Taping, Ionotophoresis 4mg /ml Dexamethasone, Manual therapy, and Re-evaluation  PLAN FOR NEXT SESSION: 30 day hold  Jena Gauss, PT, DPT 12/15/2022  11:06 AM   PHYSICAL THERAPY DISCHARGE SUMMARY  Visits from Start of Care: 8  Current functional level related to goals / functional outcomes: All goals met, decreased L shoulder pain   Remaining deficits: Occasional pain/tightness L pecs   Education / Equipment: HEP  Plan: Patient agrees to discharge. Patient is being discharged due to meeting the stated rehab goals.    She was placed on 30 day hold on 12/15/2022 and has not needed to return to skilled therapy in interim, so now discharged.     Jena Gauss, PT, DPT 3:24 PM 02/09/2023

## 2022-12-16 ENCOUNTER — Ambulatory Visit: Payer: Self-pay | Admitting: Family Medicine

## 2022-12-16 NOTE — Telephone Encounter (Signed)
Spoke to patient and advised no PA will submit to [facility] to see what her copay may be and if affordable

## 2022-12-16 NOTE — Telephone Encounter (Signed)
L/m for patient to contact me to advise patient denied approval through PAP and we can try to send to [facility] to see what out of pocket may be

## 2022-12-17 ENCOUNTER — Ambulatory Visit: Payer: Medicare Other

## 2022-12-18 ENCOUNTER — Ambulatory Visit (INDEPENDENT_AMBULATORY_CARE_PROVIDER_SITE_OTHER): Payer: Medicare Other

## 2022-12-18 ENCOUNTER — Ambulatory Visit: Payer: Medicare Other

## 2022-12-18 DIAGNOSIS — L501 Idiopathic urticaria: Secondary | ICD-10-CM | POA: Diagnosis not present

## 2022-12-24 ENCOUNTER — Other Ambulatory Visit: Payer: Self-pay | Admitting: Family Medicine

## 2022-12-24 DIAGNOSIS — R109 Unspecified abdominal pain: Secondary | ICD-10-CM

## 2023-01-02 ENCOUNTER — Other Ambulatory Visit: Payer: Self-pay | Admitting: *Deleted

## 2023-01-02 ENCOUNTER — Encounter: Payer: Self-pay | Admitting: Family Medicine

## 2023-01-02 DIAGNOSIS — E063 Autoimmune thyroiditis: Secondary | ICD-10-CM

## 2023-01-02 DIAGNOSIS — E559 Vitamin D deficiency, unspecified: Secondary | ICD-10-CM

## 2023-01-02 DIAGNOSIS — E538 Deficiency of other specified B group vitamins: Secondary | ICD-10-CM

## 2023-01-02 DIAGNOSIS — L509 Urticaria, unspecified: Secondary | ICD-10-CM

## 2023-01-05 ENCOUNTER — Ambulatory Visit
Admission: RE | Admit: 2023-01-05 | Discharge: 2023-01-05 | Disposition: A | Discharge status: Home / Self Care | Payer: Medicare Other | Source: Ambulatory Visit | Attending: Family Medicine | Admitting: Family Medicine

## 2023-01-05 DIAGNOSIS — R109 Unspecified abdominal pain: Secondary | ICD-10-CM

## 2023-01-06 ENCOUNTER — Other Ambulatory Visit (INDEPENDENT_AMBULATORY_CARE_PROVIDER_SITE_OTHER): Payer: Medicare Other

## 2023-01-06 ENCOUNTER — Ambulatory Visit (INDEPENDENT_AMBULATORY_CARE_PROVIDER_SITE_OTHER): Payer: Medicare Other

## 2023-01-06 DIAGNOSIS — E559 Vitamin D deficiency, unspecified: Secondary | ICD-10-CM | POA: Diagnosis not present

## 2023-01-06 DIAGNOSIS — E538 Deficiency of other specified B group vitamins: Secondary | ICD-10-CM

## 2023-01-06 LAB — VITAMIN B12: Vitamin B-12: 358 pg/mL (ref 211–911)

## 2023-01-06 LAB — VITAMIN D 25 HYDROXY (VIT D DEFICIENCY, FRACTURES): VITD: 57.05 ng/mL (ref 30.00–100.00)

## 2023-01-06 MED ORDER — CYANOCOBALAMIN 1000 MCG/ML IJ SOLN
1000.0000 ug | Freq: Once | INTRAMUSCULAR | Status: AC
Start: 2023-01-06 — End: 2023-01-06
  Administered 2023-01-06: 1000 ug via INTRAMUSCULAR

## 2023-01-06 NOTE — Progress Notes (Signed)
Vitamin D better.  Take 1000-2000 international units /d.  I don't recommend high doses Vitmin B12 better.  continue

## 2023-01-06 NOTE — Progress Notes (Signed)
Patient was given the b12 injection in the left arm today. Patient tolerated injection well. Donzetta Starch, CMA

## 2023-01-07 ENCOUNTER — Telehealth: Payer: Self-pay | Admitting: Internal Medicine

## 2023-01-07 ENCOUNTER — Other Ambulatory Visit: Payer: Self-pay | Admitting: Internal Medicine

## 2023-01-07 DIAGNOSIS — E063 Autoimmune thyroiditis: Secondary | ICD-10-CM

## 2023-01-07 NOTE — Telephone Encounter (Signed)
She has an appointment in July.  I do not have openings before then, but she can come for labs.  I put in the thyroid function tests for her.

## 2023-01-07 NOTE — Telephone Encounter (Signed)
Patient is having some issues with Hashimoto's dz. She is asking to speak to clinical staff and poss. Come in this week. Please advise.

## 2023-01-08 ENCOUNTER — Encounter: Payer: Self-pay | Admitting: Internal Medicine

## 2023-01-08 NOTE — Telephone Encounter (Signed)
Pt is coming in on 01/12/23 for labs.

## 2023-01-12 ENCOUNTER — Encounter: Payer: Self-pay | Admitting: Internal Medicine

## 2023-01-12 ENCOUNTER — Other Ambulatory Visit (INDEPENDENT_AMBULATORY_CARE_PROVIDER_SITE_OTHER): Payer: Medicare Other

## 2023-01-12 ENCOUNTER — Encounter: Payer: Self-pay | Admitting: Family Medicine

## 2023-01-12 DIAGNOSIS — E063 Autoimmune thyroiditis: Secondary | ICD-10-CM

## 2023-01-12 LAB — T3, FREE: T3, Free: 3 pg/mL (ref 2.3–4.2)

## 2023-01-12 LAB — TSH: TSH: 2.21 u[IU]/mL (ref 0.35–5.50)

## 2023-01-12 LAB — T4, FREE: Free T4: 0.65 ng/dL (ref 0.60–1.60)

## 2023-01-13 ENCOUNTER — Other Ambulatory Visit: Payer: Self-pay | Admitting: Internal Medicine

## 2023-01-13 ENCOUNTER — Ambulatory Visit (INDEPENDENT_AMBULATORY_CARE_PROVIDER_SITE_OTHER): Payer: Medicare Other

## 2023-01-13 DIAGNOSIS — L501 Idiopathic urticaria: Secondary | ICD-10-CM

## 2023-01-13 DIAGNOSIS — E063 Autoimmune thyroiditis: Secondary | ICD-10-CM

## 2023-01-13 MED ORDER — LEVOTHYROXINE SODIUM 25 MCG PO TABS: 25.0000 ug | ORAL_TABLET | Freq: Every day | ORAL | 3 refills | Status: DC

## 2023-01-14 ENCOUNTER — Other Ambulatory Visit: Payer: Self-pay | Admitting: *Deleted

## 2023-01-14 DIAGNOSIS — L501 Idiopathic urticaria: Secondary | ICD-10-CM

## 2023-01-27 ENCOUNTER — Ambulatory Visit: Payer: Self-pay | Admitting: Internal Medicine

## 2023-01-27 ENCOUNTER — Ambulatory Visit (INDEPENDENT_AMBULATORY_CARE_PROVIDER_SITE_OTHER): Payer: Medicare Other | Admitting: Internal Medicine

## 2023-01-27 ENCOUNTER — Encounter: Payer: Self-pay | Admitting: Internal Medicine

## 2023-01-27 VITALS — BP 118/60 | HR 58 | Ht 65.0 in | Wt 121.8 lb

## 2023-01-27 DIAGNOSIS — E063 Autoimmune thyroiditis: Secondary | ICD-10-CM

## 2023-01-27 DIAGNOSIS — L508 Other urticaria: Secondary | ICD-10-CM | POA: Diagnosis not present

## 2023-01-27 NOTE — Progress Notes (Signed)
Patient ID: Chelsea Vega, female   DOB: Oct 22, 1955, 67 y.o.   MRN: [id number]  HPI  Chelsea Vega is a 67 y.o.-year-old female, initially referred by her PCP, Dr. Joycelyn Vega, returning for follow-up for Hashimoto's thyroiditis and urticaria.  Interim history: She continues to have pruritus.  She was referred to allergology. She stopped Estrogen gel and started LT4 approximately 2 weeks ago.  The rash on her back has resolved in the last 2 weeks. She also lost 4 more lbs since last OV as she is trying to potential dietary culprits for her itching.  She greatly reduced the intake of cheese and also chips and other snacks. She had an episode of right upper quadrant pain after eating greasy food and a recent RUQ U/S showed cholelithiasis without cholecystitis.  She was advised that if she developed further episodes, she might need to have a HIDA scan.  Reviewed and addended history: Patient describes she initially had a URI in 05/2022.  She had cough and time but also developed itching and a rash in an 10 cm area in her lower back.  Cough was treated with a Z-Pak and the rash with triamcinolone cream and they both improved.  However, the rash then reappeared in the same spot and in 07/2022 it started to spread to legs, arms, breast, stomach and then worsened in 08/2022, when it included her scalp.  Recently, he started to have falls.  She is scratching excessively at night and has to sleep with ice packs.  During the day, the itching is better, but it worsens towards the evening, especially if she is more active.  She actually has bruises on her body due to scratching.  Patient also describes that she has anxiety, gets hot, and has nausea when the itching is worse.  Saw allergist, who obtained a normal tryptase and a normal alpha-gal test.  LFTs were normal.  Hemoglobin was also normal.  She did not have elevated eosinophiles.  Other possible causes for chronic urticaria were investigated with negative  results (chronic urticaria index <2.9).  She is currently on Zyrtec and Pepcid and as needed steroid cream.  During this investigation, she has been dx'ed with Hashimoto's thyroiditis hypothyroidism in 08/2022 by elevated TPO antibodies; she is not on Levothyroxine as her thyroid function tests are normal.  She sees Dr. Vincente Vega (OB/GYN) and she suggested a referral to endocrinology.  Patient has idiopathic urticaria and her TFTs and thyroid antibodies were checked during investigation for this condition.  She continues to have hives despite taking antihistamines.  As mentioned above, a tryptase level was normal.  10/2022: Started selenium in an effort to decrease her TPO antibodies. At that time, we also decrease vitamin D supplement as her level was elevated.  01/2023: Started LT4 low-dose 25 mcg daily to see if this would help improve pruritus At that time, also stopped her estrogen gel.  She has been on this for many years.  She is currently on: - Selenium 200 mcg daily - started 10/2022 - Levothyroxine 25 mcg daily - started 01/2023  She takes this: - in am - fasting - at least 30 min from b'fast - no calcium - no iron - no multivitamins - no PPIs, + Pepcid 4h later - not on Biotin  I reviewed pt's thyroid tests: Lab Results  Component Value Date   TSH 2.21 01/12/2023   TSH 1.56 10/29/2022   TSH 2.410 09/11/2022   TSH 1.68 08/11/2006   FREET4 0.65  01/12/2023   FREET4 0.75 10/29/2022    Component     Latest Ref Rng 09/11/2022  Thyroperoxidase Ab SerPl-aCnc     0 - 34 IU/mL 43 (H)     Pt denies feeling nodules in neck, hoarseness, dysphagia/odynophagia. A neck U/S (03/03/2019) was normal.  She has + FH of thyroid disorders in: sister - Graves ds.. No FH of thyroid cancer. + FH of autoimmune ds. - Raynaud's phenomenon. No h/o radiation tx to head or neck. No recent use of iodine supplements.   High vitamin D:  At last visit, patient was taking 10,000 units vitamin D  daily.  Vitamin D level was elevated so I advised her to stop the supplement.  She then restarted 2000 units daily - 01/2023.  Reviewed Vitamin D levels: Lab Results  Component Value Date   VD25OH 57.05 01/06/2023   VD25OH 106.00 (HH) 10/29/2022   Pt. also has a history of osteoarthritis, osteoporosis, GERD (detected on barium swallow 04/2009), eyelid ptosis (s/p surgery-Dr. Galen Manila), gallstones, bradycardia, depression. She is currently off her estrogen and other supplements to see if pruritus improves. She has B12 deficiency and is on IM B12.  ROS: + See HPI + Weight loss No nausea/+ acid reflux + muscle/joint aches in left ankle and left shoulder + rash and dry skin,  + itching  Past Medical History:  Diagnosis Date   Abnormal vaginal Pap smear    DISTANT HX- S/P LEEP   Allergic rhinitis    Allergy young child   Hay Fever   Anemia    pregnancy   Anxiety Nov. 2023   Hives have caused it to worsen   Arthritis 2006   Left ankle- chronic pain   Asthma    childhood, early 20's   Constipation    Depression    Dysrhythmia    occassional PVC's   Gallstone    GERD (gastroesophageal reflux disease)    Hx of cystitis    RECURRENT, DR. TANNENBAUM   Hyperlipidemia Sept. 2023   No need to treat   Insomnia    Osteoporosis 10/2008   DR. GREWAL, DEXA 10/2008   Postmenopausal    PVC's (premature ventricular contractions)    HX OF   Slow heart rate    Thyroid disease Nov. 2023   Elevated Thyroid antibodies   Tinnitus    Trapezius muscle spasm    Vitamin D deficiency    Past Surgical History:  Procedure Laterality Date   ANKLE SURGERY Right 05/2011   repair of tendon   ANTERIOR AND POSTERIOR REPAIR N/A 05/24/2015   Procedure: ANTERIOR (CYSTOCELE) AND POSTERIOR REPAIR (RECTOCELE);  Surgeon: Marcelle Overlie, MD;  Location: [facility];  Service: Gynecology;  Laterality: N/A;   BLEPHAROPLASTY  12/2021   BREAST CYST ASPIRATION     2005   COSMETIC SURGERY  June 2023    Blepharoplasty   DIAGNOSTIC LAPAROSCOPY  07/14/2010   removed tubes and ovaries   GANGLION CYST EXCISION Right 2005   right wrist   SEPTOPLASTY  2009   VAGINAL HYSTERECTOMY  05/24/2015   Procedure: HYSTERECTOMY VAGINAL;  Surgeon: Marcelle Overlie, MD;  Location: [facility];  Service: Gynecology;;  uterus and cervix only   Social History   Socioeconomic History   Marital status: Married    Spouse name: Not on file   Number of children: 3   Years of education: Not on file   Highest education level: Not on file  Occupational History   Occupation: NURSE  Occupation: Charity fundraiser    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 at age [age].  Vaping Use   Vaping status: Never Used  Substance and Sexual Activity   Alcohol use: Yes    Alcohol/week: 12.0 standard drinks of alcohol    Types: 4 Glasses of wine, 2 Cans of beer, 6 Standard drinks or equivalent per week    Comment: Usually [place] Seltzer1-2/d   Drug use: No   Sexual activity: Not Currently    Birth control/protection: Surgical  Other Topics Concern   Not on file  Social History Narrative   2 grands   Social Determinants of Health   Financial Resource Strain: Not on file  Food Insecurity: Not on file  Transportation Needs: Not on file  Physical Activity: Not on file  Stress: Not on file  Social Connections: Not on file  Intimate Partner Violence: Not on file   Current Outpatient Medications on File Prior to Visit  Medication Sig Dispense Refill   Calcium Carbonate (CALTRATE 600 PO) Take 1 tablet by mouth daily.  Chewable tablet     cetirizine (ZYRTEC) 5 MG tablet Take 5 mg by mouth daily. 2 tablets in pm     EPINEPHrine 0.3 mg/0.3 mL IJ SOAJ injection Inject 0.3 mg into the muscle as needed for anaphylaxis. 1 each 1   escitalopram (LEXAPRO) 10 MG tablet Take 10 mg by mouth  daily. 5 mg in am     Estradiol 0.52 MG/0.87 GM (0.06%) GEL Place 1 application onto the skin daily.      famotidine (PEPCID) 40 MG tablet Take 40 mg by mouth 2 (two) times daily.     fexofenadine (ALLEGRA) 180 MG tablet Take 180 mg by mouth daily. 2 tablets in pm     fluticasone (FLONASE) 50 MCG/ACT nasal spray Place 1 spray into both nostrils daily.      ibuprofen (ADVIL,MOTRIN) 600 MG tablet Take 1 tablet (600 mg total) by mouth every 6 (six) hours as needed (mild pain). 60 tablet 0   levothyroxine (SYNTHROID) 25 MCG tablet Take 1 tablet (25 mcg total) by mouth daily. 45 tablet 3   Omega-3 Fatty Acids (FISH OIL PO) Take 1 tablet by mouth daily.     triamcinolone cream (KENALOG) 0.5 % Apply topically.     UNABLE TO FIND daily. Med Name: THYROID SUPPORT COMPLEX WITH 200 MG SELENIUM     zolpidem (AMBIEN) 5 MG tablet Take 2.5 mg by mouth at bedtime as needed.     Current Facility-Administered Medications on File Prior to Visit  Medication Dose Route Frequency Provider Last Rate Last Admin   omalizumab Geoffry Paradise) prefilled syringe 300 mg  300 mg Subcutaneous Q28 days Ferol Luz, MD   300 mg at 01/13/23 4401   Allergies  Allergen Reactions   Flagyl [Metronidazole] Other (See Comments)    Dizzy   Family History  Problem Relation Age of Onset   Cancer Mother 26       ovarian   Hyperlipidemia Mother    Anxiety disorder Mother    Asthma Mother    COPD Mother    Depression Mother    Hearing loss Mother    Miscarriages / [place] Mother    Cancer Father 49       prostate   Alcohol abuse Father  Arthritis Father    Vision loss Father    Varicose Veins Father    Arthritis Sister    Hyperlipidemia Sister    Hypertension Sister    Luiz Blare' disease Sister    Depression Sister    Hypertension Sister    Cancer Brother 38       prostate   Depression Brother    Asthma Child    Asthma Child    PE: BP 118/60   Pulse (!) 58   Ht 5\' 5"  (1.651 m)   Wt 121 lb 12.8 oz (55.2 kg)    SpO2 99%   BMI 20.27 kg/m  Wt Readings from Last 3 Encounters:  01/27/23 121 lb 12.8 oz (55.2 kg)  10/30/22 124 lb 4 oz (56.4 kg)  10/29/22 126 lb 3.2 oz (57.2 kg)   Constitutional: normal weight, in NAD Eyes:  EOMI, no exophthalmos ENT: no neck masses, no cervical lymphadenopathy Cardiovascular: RRR, No MRG Respiratory: CTA B Musculoskeletal: no deformities Skin: + Bruises on upper arms, dry skin, scratch marks -erythema Neurological: no tremor with outstretched hands  ASSESSMENT: 1. Hashimoto thyroiditis  2. Chronic urticaria  PLAN: 1. Hashimoto thyroiditis -Currently on selenium and also LT4 to see if we can help alleviate her urticaria - latest thyroid labs reviewed with pt. >> normal: Lab Results  Component Value Date   TSH 2.21 01/12/2023  - she continues on LT4 25 mcg daily - pt feels good on this dose.  - we discussed about taking the thyroid hormone every day, with water, >30 minutes before breakfast, separated by >4 hours from acid reflux medications, calcium, iron, multivitamins. Pt. is taking it correctly. - will check thyroid tests in 1 month: TSH and fT4 - If labs are abnormal, she will need to return for repeat TFTs in 1.5 months - OTW, I will plan to see her back in ~5 months  2.  Chronic urticaria -For the last approximately 9 months -At last visit, she had significant quality of life impairment due to this: Itching included the scalp and the entire body, including palms.  It was worse at night and she is unable to sleep well.  She had bruises from scratching.  She has decreased appetite and lost weight.  Reviewed previous workup: -Systemic mastocytosis was ruled out with a normal tryptase -Cholestasis was ruled out with normal LFTs -Alpha-gal allergy was ruled out with a normal alpha gal panel -Anemia was ruled out with a normal hemoglobin -A parasitic infection was ruled out by normal eosinophils -She also had a normal chronic urticaria panel -She  did not feel that any medications that she is taking could be causing this.  She takes occasional Ambien low-dose, but symptoms are not correlating with this. -We started selenium at last visit to lower her antibody titer -this did not help much -Her vitamin D level was elevated so we stopped her supplement dose and latest level was normal approximately 3 weeks ago.  She restarted a lower dose of vitamin D, 2000 units daily.  Of note, she did not have high calcium levels. -In 01/2023, we started LT4 low dose to see if this can help.  Around the same time, she stopped her estrogen gel.  - At today's visit, she tells me that the rash on her back disappeared in the last 2 weeks.  She still has itching and has bruises on the sides of her abdomen, but feels that the itching has improved.  There are 3 possible measures that helped  with the itching: Normalization of her vitamin D, starting LT4, and stopping estrogen. -She mentions that she would like to maybe try again starting on the estrogen to see if the rash reoccurs.  If so, she needs to stop estrogen right away and we may be able to start LT4.  I advised her to let me know.  If the rash does not recur, she can continue the estrogen but she probably will need to stay on the LT4 for now.  Carlus Pavlov, MD PhD [facility]

## 2023-01-27 NOTE — Progress Notes (Signed)
FOLLOW UP Date of Service/Encounter:  01/29/23   Subjective:  Chelsea Vega (DOB: February 02, 1956) is a 67 y.o. female who [facility] in re-evaluation of the following: chronic urticaria History obtained from: chart review and patient.  For Review, LV was on 10/22/22  with Dr. Marlynn Perking seen for routine follow-up. See below for summary of history and diagnostics.  Therapeutic plans/changes recommended: Xolair started, failed pepcid 20 mg + allegra 360 mg AM and zyrtec 20 mg nightly.  ----------------------------------------------------- Pertinent History/Diagnostics:  Urticaria:  Started Oct 2023 a few weeks following viral illness, Associates itching, no other systemic symptoms. No triggers. No red flag symptoms, Lesions resolve in hours.  Therapies tried: triamcinolone cream, Nizoral lotion, zyrtec 1 tablets in AM zyrtec in the afternoon and benadryl 1-2 tablets in PM, ice packs. Pcitures of rash consistent with typical urticaria. - labs (09/11/22): negative CU index, negative alpha gal panel, normal CBCd, CMP, normal thyroid TSH, T4, elevated TPO, normal ESR, baseline tryptase 5.6 Diagnosed with Hashimoto thyroiditis based on labs. On selenium and LT4. Xolair started in May. --------------------------------------------------- Today presents for follow-up. She has now had 4 Xolair injections. She does feel that after the second Xolair, she did have relief for about one month. It slowly started to come back after one month, but not as bad as it was. Still occurring almost daily. She feels like Xolair effectiveness starts to taper off around the 2 week mark.  She has continued with antihistamines - Claritin 20 mg morning and zyrtec in the evening. She has not tried xyzal yet.  She is using pepcid twice daily. She has seen her endocrinologist due to [facility] to make sure this is controlled.  Her labs still remain in the normal range. She was started on  levothyroxine to see if that would help with hives. Starting this did seem to help somewhat.  She also stopped her topical estrogen this time and received a Xolair injection. She is unsure which of these maybe helped her symptoms. However, hives while less intense, continue to occur.   Allergies as of 01/29/2023       Reactions   Flagyl [metronidazole] Other (See Comments)   Dizzy        Medication List        Accurate as of January 29, 2023  3:57 PM. If you have any questions, ask your nurse or doctor.          CALTRATE 600 PO Take 1 tablet by mouth daily.  Chewable tablet   cetirizine 5 MG tablet Commonly known as: ZYRTEC Take 5 mg by mouth daily. 2 tablets in pm   CLARITIN PO Claritin   EPINEPHrine 0.3 mg/0.3 mL Soaj injection Commonly known as: EPI-PEN Inject 0.3 mg into the muscle as needed for anaphylaxis.   escitalopram 10 MG tablet Commonly known as: LEXAPRO Take 10 mg by mouth daily. 5 mg in am   Estradiol 0.52 MG/0.87 GM (0.06%) Gel Place 1 application onto the skin daily.   famotidine 40 MG tablet Commonly known as: PEPCID Take 40 mg by mouth 2 (two) times daily.   FISH OIL PO Take 1 tablet by mouth daily.   fluticasone 50 MCG/ACT nasal spray Commonly known as: FLONASE Place 1 spray into both nostrils daily.   ibuprofen 600 MG tablet Commonly known as: ADVIL Take 1 tablet (600 mg total) by mouth every 6 (six) hours as needed (mild pain).   levothyroxine 25 MCG tablet Commonly  known as: SYNTHROID Take 1 tablet (25 mcg total) by mouth daily.   triamcinolone cream 0.5 % Commonly known as: KENALOG Apply topically.   UNABLE TO FIND daily. Med Name: THYROID SUPPORT COMPLEX WITH 200 MG SELENIUM   vitamin C 1000 MG tablet   Vitamin D3 1000 units Caps   zolpidem 5 MG tablet Commonly known as: AMBIEN Take 2.5 mg by mouth at bedtime as needed.       Past Medical History:  Diagnosis Date   Abnormal vaginal Pap smear    DISTANT HX- S/P LEEP    Allergic rhinitis    Allergy young child   Hay Fever   Anemia    pregnancy   Anxiety Nov. 2023   Hives have caused it to worsen   Arthritis 2006   Left ankle- chronic pain   Asthma    childhood, early 20's   Constipation    Depression    Dysrhythmia    occassional PVC's   Gallstone    GERD (gastroesophageal reflux disease)    Hx of cystitis    RECURRENT, DR. TANNENBAUM   Hyperlipidemia Sept. 2023   No need to treat   Insomnia    Osteoporosis 10/2008   DR. GREWAL, DEXA 10/2008   Postmenopausal    PVC's (premature ventricular contractions)    HX OF   Slow heart rate    Thyroid disease Nov. 2023   Elevated Thyroid antibodies   Tinnitus    Trapezius muscle spasm    Vitamin D deficiency    Past Surgical History:  Procedure Laterality Date   ANKLE SURGERY Right 05/2011   repair of tendon   ANTERIOR AND POSTERIOR REPAIR N/A 05/24/2015   Procedure: ANTERIOR (CYSTOCELE) AND POSTERIOR REPAIR (RECTOCELE);  Surgeon: Marcelle Overlie, MD;  Location: [facility];  Service: Gynecology;  Laterality: N/A;   BLEPHAROPLASTY  12/2021   BREAST CYST ASPIRATION     2005   COSMETIC SURGERY  June 2023   Blepharoplasty   DIAGNOSTIC LAPAROSCOPY  07/14/2010   removed tubes and ovaries   GANGLION CYST EXCISION Right 2005   right wrist   SEPTOPLASTY  2009   VAGINAL HYSTERECTOMY  05/24/2015   Procedure: HYSTERECTOMY VAGINAL;  Surgeon: Marcelle Overlie, MD;  Location: [facility];  Service: Gynecology;;  uterus and cervix only   Otherwise, there have been no changes to her past medical history, surgical history, family history, or social history.  ROS: All others negative except as noted per HPI.   Objective:  BP 100/62 (BP Location: Right Arm, Patient Position: Sitting, Cuff Size: Small)   Pulse 64   Temp (!) 97.5 F (36.4 C) (Temporal)   Resp 16   SpO2 98%  There is no height or weight on file to calculate BMI. Physical Exam: General Appearance:  Alert, cooperative, no distress, appears  stated age  Head:  Normocephalic, without obvious abnormality, atraumatic  Eyes:  Conjunctiva clear, EOM's intact  Nose: Nares normal,  no visible rhinorrhea  Throat: Lips, tongue normal; teeth and gums normal, normal posterior oropharynx  Neck: Supple, symmetrical  Lungs:   clear to auscultation bilaterally, Respirations unlabored, no coughing  Heart:  regular rate and rhythm and no murmur, Appears well perfused  Extremities: No edema  Skin: No rashes in visible areas of skin  Neurologic: No gross deficits   Assessment/Plan  Discussed in detail that only with idiopathic urticaria we are often unable to pinpoint the exact cause.  She does have elevated thyroid antibodies, and this makes  other autoimmune diseases more common.  It does not mean that her thyroid disease is causing her hives.  It does not mean that treating her thyroid disease will necessarily resolve her hives. Hormonal imbalances have also been postulated as a possible trigger for chronic urticaria.  It is possible that her using topical estrogen has caused chronic urticaria, but this should resolve without the use of topical estrogen ointment and it has not. Chronic urticaria is not thought to be an allergic process. Explained that our approach will be symptomatic control of symptoms. We will trial increasing her Xolair frequency to see if this allows for better control of symptoms.   Chronic Idiopathic Urticaria: Not well-controlled Therapy Plan:  -Increase Xolair 300 mg subcu every 2 weeks  -Biologics coordinator Tammy will reach out to about the approval process for increasing dose. - Continue zyrtec (cetirizine) to max dose of 20mg  (2 pills) twice daily- this is maximum dose- -continue Pepcid 20 mg twice daily - avoid benadryl due to unwanted side effects - can use ice packs, sarna cream to  help relieve itching - avoid overusing triamcinolone as this is unlikely helping - can increase or decrease dosing depending on  symptom control to a maximum dose of 4 tablets of antihistamine daily. Wait until hives free for at least one month prior to decreasing dose.   - if hives are still uncontrolled with the above regimen, please arrange an appointment for discussion of Xolair (omalizumab)- an injectable medication for hives  Can use one of the following in place of zyrtec if desires: Claritin (loratadine) 10 mg, Xyzal (levocetirizine) 5 mg or Allegra (fexofenadine) 180 mg daily as needed  Follow up : in 3 months, sooner if needed It was a pleasure seeing you again in clinic today! Thank you for allowing me to participate in your care.  Thank you so much for letting me partake in your care today.  Don't hesitate to reach out if you have any additional concerns!  Tonny Bollman, MD  [facility]

## 2023-01-27 NOTE — Patient Instructions (Addendum)
Please continue levothyroxine 25 mcg daily.  Take the thyroid hormone every day, with water, at least 30 minutes before breakfast, separated by at least 4 hours from: - acid reflux medications - calcium - iron - multivitamins  Please come back for labs in ~1 month.  Please return in 4-5 months.

## 2023-01-29 ENCOUNTER — Encounter: Payer: Self-pay | Admitting: Internal Medicine

## 2023-01-29 ENCOUNTER — Ambulatory Visit (INDEPENDENT_AMBULATORY_CARE_PROVIDER_SITE_OTHER): Payer: Medicare Other | Admitting: Internal Medicine

## 2023-01-29 VITALS — BP 100/62 | HR 64 | Temp 97.5°F | Resp 16

## 2023-01-29 DIAGNOSIS — L501 Idiopathic urticaria: Secondary | ICD-10-CM

## 2023-01-29 DIAGNOSIS — E063 Autoimmune thyroiditis: Secondary | ICD-10-CM

## 2023-01-29 NOTE — Patient Instructions (Signed)
Chronic Idiopathic Urticaria: Not well-controlled   Therapy Plan:  -Increase Xolair 300 mg subcu every 2 weeks  -Biologics coordinator Tammy will reach out to about the approval process for increasing dose. - Continue zyrtec (cetirizine) to max dose of 20mg  (2 pills) twice daily- this is maximum dose- -continue Pepcid 20 mg twice daily - avoid benadryl due to unwanted side effects - can use ice packs, sarna cream to  help relieve itching - avoid overusing triamcinolone as this is unlikely helping - can increase or decrease dosing depending on symptom control to a maximum dose of 4 tablets of antihistamine daily. Wait until hives free for at least one month prior to decreasing dose.   - if hives are still uncontrolled with the above regimen, please arrange an appointment for discussion of Xolair (omalizumab)- an injectable medication for hives  Can use one of the following in place of zyrtec if desires: Claritin (loratadine) 10 mg, Xyzal (levocetirizine) 5 mg or Allegra (fexofenadine) 180 mg daily as needed  Follow up : in 3 months, sooner if needed It was a pleasure seeing you again in clinic today! Thank you for allowing me to participate in your care.  Thank you so much for letting me partake in your care today.  Don't hesitate to reach out if you have any additional concerns!  Ferol Luz, MD  [facility] [place] [place]

## 2023-02-02 ENCOUNTER — Encounter: Payer: Self-pay | Admitting: Internal Medicine

## 2023-02-03 ENCOUNTER — Ambulatory Visit: Payer: Medicare Other | Admitting: Family Medicine

## 2023-02-03 ENCOUNTER — Encounter: Payer: Self-pay | Admitting: Family Medicine

## 2023-02-03 ENCOUNTER — Telehealth: Payer: Self-pay | Admitting: Family Medicine

## 2023-02-03 VITALS — BP 104/70 | HR 57 | Temp 98.4°F | Resp 16 | Ht 65.0 in | Wt 120.5 lb

## 2023-02-03 DIAGNOSIS — R001 Bradycardia, unspecified: Secondary | ICD-10-CM

## 2023-02-03 DIAGNOSIS — R0789 Other chest pain: Secondary | ICD-10-CM

## 2023-02-03 DIAGNOSIS — R1032 Left lower quadrant pain: Secondary | ICD-10-CM

## 2023-02-03 DIAGNOSIS — F5101 Primary insomnia: Secondary | ICD-10-CM

## 2023-02-03 DIAGNOSIS — R5383 Other fatigue: Secondary | ICD-10-CM

## 2023-02-03 DIAGNOSIS — E538 Deficiency of other specified B group vitamins: Secondary | ICD-10-CM | POA: Diagnosis not present

## 2023-02-03 LAB — POC URINALSYSI DIPSTICK (AUTOMATED)
Bilirubin, UA: NEGATIVE
Blood, UA: NEGATIVE
Glucose, UA: NEGATIVE
Ketones, UA: NEGATIVE
Leukocytes, UA: NEGATIVE
Nitrite, UA: NEGATIVE
Protein, UA: NEGATIVE
Spec Grav, UA: 1.015 (ref 1.010–1.025)
Urobilinogen, UA: 0.2 E.U./dL
pH, UA: 7 (ref 5.0–8.0)

## 2023-02-03 MED ORDER — CYANOCOBALAMIN 1000 MCG/ML IJ SOLN
1000.0000 ug | Freq: Once | INTRAMUSCULAR | Status: AC
  Administered 2023-02-03: 1000 ug via INTRAMUSCULAR

## 2023-02-03 MED ORDER — ZOLPIDEM TARTRATE 5 MG PO TABS
2.5000 mg | ORAL_TABLET | Freq: Every evening | ORAL | 2 refills | Status: DC | PRN
Start: 2023-02-03 — End: 2023-11-03

## 2023-02-03 NOTE — Patient Instructions (Signed)
It was very nice to see you today!  get X-ray/labs at [facility].  [address]  hours 8=M-F 8:30-5.  closed 12:30-1 lunch    PLEASE NOTE:  If you had any lab tests please let us know if you have not heard back within a few days. You may see your results on MyChart before we have a chance to review them but we will give you a call once they are reviewed by us. If we ordered any referrals today, please let us know if you have not heard from their office within the next week.   Please try these tips to maintain a healthy lifestyle:  Eat most of your calories during the day when you are active. Eliminate processed foods including packaged sweets (pies, cakes, cookies), reduce intake of potatoes, white bread, white pasta, and white rice. Look for whole grain options, oat flour or almond flour.  Each meal should contain half fruits/vegetables, one quarter protein, and one quarter carbs (no bigger than a computer mouse).  Cut down on sweet beverages. This includes juice, soda, and sweet tea. Also watch fruit intake, though this is a healthier sweet option, it still contains natural sugar! Limit to 3 servings daily.  Drink at least 1 glass of water with each meal and aim for at least 8 glasses per day  Exercise at least 150 minutes every week.   

## 2023-02-03 NOTE — Telephone Encounter (Signed)
Please advise 

## 2023-02-03 NOTE — Progress Notes (Signed)
Subjective:     Patient ID: Chelsea Vega, female    DOB: [birth year], 67 y.o.   MRN: [id number]  Chief Complaint  Patient presents with   Follow-up    Follow-up to get blood work done Medication refill of Ambien   B 12 Injection    HPI Left sided chest pain - She reports of left chest pain that occasionally radiates to the left axilla pain. She states this can occur after eating at times. State this started many years ago. Has followed up with cardiology for this. Has hx of GERD and believes it may be related to acid reflux. She exercises regularly, has no limitations. Has had mammogram, results were normal. Has not had chest x-ray. No dizziness or lightheadedness.   Left back pain - She reports left lower back pain, describes the pain as nagging, dull ache, and sometimes a pinching sensation. Pain does not radiate down the leg. Pain does not worsen with movements. Reports she is a side sleeper ans is unsure if this may be a contributing factor.   Fatigue - She endorses some fatigue, but is unsure what it may be attributed to.   B12 Deficiency - Is tolerating and adjusting well to B12.   Insomnia - Is on Ambien at nighttime as needed, taking 2.5mg  . She usually only takes a quarter of a pill (1.25 mg) but occasionally will take half a pill (2.5 mg).  Ear ringing - Intermittent ringing in right ear, mostly occurs at nighttime.   Exercise/Diet - She exercises regularly with no limitations. Walks about 5d/wk. She used to be a runner, but has been unable to run since injuring her ankle. States she has a low resting HR due to her past of running. Has cut gluten and dairy. Eating healthier overall. Has lost weight since starting diet. Denies any diarrhea.    Health Maintenance Due  Topic Date Due   Medicare Annual Wellness (AWV)  Never done    Past Medical History:  Diagnosis Date   Abnormal vaginal Pap smear    DISTANT HX- S/P LEEP   Allergic rhinitis    Allergy young child   Hay  Fever   Anemia    pregnancy   Anxiety Nov. 2023   Hives have caused it to worsen   Arthritis 2006   Left ankle- chronic pain   Asthma    childhood, early 20's   Constipation    Depression    Dysrhythmia    occassional PVC's   Gallstone    GERD (gastroesophageal reflux disease)    Hx of cystitis    RECURRENT, DR. TANNENBAUM   Hyperlipidemia Sept. 2023   No need to treat   Insomnia    Osteoporosis 10/2008   DR. GREWAL, DEXA 10/2008   Postmenopausal    PVC's (premature ventricular contractions)    HX OF   Slow heart rate    Thyroid disease Nov. 2023   Elevated Thyroid antibodies   Tinnitus    Trapezius muscle spasm    Vitamin D deficiency     Past Surgical History:  Procedure Laterality Date   ANKLE SURGERY Right 05/2011   repair of tendon   ANTERIOR AND POSTERIOR REPAIR N/A 05/24/2015   Procedure: ANTERIOR (CYSTOCELE) AND POSTERIOR REPAIR (RECTOCELE);  Surgeon: Marcelle Overlie, MD;  Location: [facility];  Service: Gynecology;  Laterality: N/A;   BLEPHAROPLASTY  12/2021   BREAST CYST ASPIRATION     2005   COSMETIC SURGERY  June 2023  Blepharoplasty   DIAGNOSTIC LAPAROSCOPY  07/14/2010   removed tubes and ovaries   GANGLION CYST EXCISION Right 2005   right wrist   SEPTOPLASTY  2009   VAGINAL HYSTERECTOMY  05/24/2015   Procedure: HYSTERECTOMY VAGINAL;  Surgeon: Marcelle Overlie, MD;  Location: [facility];  Service: Gynecology;;  uterus and cervix only     Current Outpatient Medications:    Ascorbic Acid (VITAMIN C) 1000 MG tablet, , Disp: , Rfl:    Calcium Carbonate (CALTRATE 600 PO), Take 1 tablet by mouth daily.  Chewable tablet, Disp: , Rfl:    cetirizine (ZYRTEC) 5 MG tablet, Take 5 mg by mouth daily. 2 tablets in pm, Disp: , Rfl:    Cholecalciferol (VITAMIN D3) 1000 units CAPS, , Disp: , Rfl:    EPINEPHrine 0.3 mg/0.3 mL IJ SOAJ injection, Inject 0.3 mg into the muscle as needed for anaphylaxis., Disp: 1 each, Rfl: 1   escitalopram (LEXAPRO) 10 MG tablet, Take 10 mg  by mouth daily. 5 mg in am, Disp: , Rfl:    Estradiol 0.52 MG/0.87 GM (0.06%) GEL, Place 1 application onto the skin daily. , Disp: , Rfl:    famotidine (PEPCID) 40 MG tablet, Take 40 mg by mouth 2 (two) times daily., Disp: , Rfl:    fluticasone (FLONASE) 50 MCG/ACT nasal spray, Place 1 spray into both nostrils daily. , Disp: , Rfl:    ibuprofen (ADVIL,MOTRIN) 600 MG tablet, Take 1 tablet (600 mg total) by mouth every 6 (six) hours as needed (mild pain)., Disp: 60 tablet, Rfl: 0   levothyroxine (SYNTHROID) 25 MCG tablet, Take 1 tablet (25 mcg total) by mouth daily., Disp: 45 tablet, Rfl: 3   Loratadine (CLARITIN PO), Claritin, Disp: , Rfl:    Omega-3 Fatty Acids (FISH OIL PO), Take 1 tablet by mouth daily., Disp: , Rfl:    triamcinolone cream (KENALOG) 0.5 %, Apply topically., Disp: , Rfl:    UNABLE TO FIND, daily. Med Name: THYROID SUPPORT COMPLEX WITH 200 MG SELENIUM, Disp: , Rfl:    zolpidem (AMBIEN) 5 MG tablet, Take 0.5 tablets (2.5 mg total) by mouth at bedtime as needed., Disp: 30 tablet, Rfl: 2  Current Facility-Administered Medications:    omalizumab Geoffry Paradise) prefilled syringe 300 mg, 300 mg, Subcutaneous, Q28 days, Ferol Luz, MD, 300 mg at 01/13/23 7829  Allergies  Allergen Reactions   Flagyl [Metronidazole] Other (See Comments)    Dizzy   ROS neg/noncontributory except as noted HPI/below      Objective:     BP 104/70   Pulse (!) 57   Temp 98.4 F (36.9 C) (Temporal)   Resp 16   Ht 5\' 5"  (1.651 m)   Wt 120 lb 8 oz (54.7 kg)   SpO2 98%   BMI 20.05 kg/m  Wt Readings from Last 3 Encounters:  02/03/23 120 lb 8 oz (54.7 kg)  01/27/23 121 lb 12.8 oz (55.2 kg)  10/30/22 124 lb 4 oz (56.4 kg)    Physical Exam   Gen: WDWN NAD HEENT: NCAT, conjunctiva not injected, sclera nonicteric NECK:  supple, no thyromegaly, no nodes, no carotid bruits CARDIAC: Bradycardia, RRR, S1S2+, no murmur.  LUNGS: CTAB. No wheezes ABDOMEN:  BS+, soft, NTND, No HSM, no masses. No  CVAT. +Mildly diffusely tender EXT:  no edema MSK: no gross abnormalities.  NEURO: A&O x3.  CN II-XII intact.  PSYCH: normal mood. Good eye contact  Results for orders placed or performed in visit on 02/03/23  POCT Urinalysis Dipstick (Automated)  Result Value Ref Range   Color, UA YELLOW    Clarity, UA CLEAR    Glucose, UA Negative Negative   Bilirubin, UA NEG    Ketones, UA NEG    Spec Grav, UA 1.015 1.010 - 1.025   Blood, UA NEG    pH, UA 7.0 5.0 - 8.0   Protein, UA Negative Negative   Urobilinogen, UA 0.2 0.2 or 1.0 E.U./dL   Nitrite, UA NEG    Leukocytes, UA Negative Negative        Assessment & Plan:  Vitamin B12 deficiency -     Cyanocobalamin  Primary insomnia -     Zolpidem Tartrate; Take 0.5 tablets (2.5 mg total) by mouth at bedtime as needed.  Dispense: 30 tablet; Refill: 2  Other chest pain -     DG Chest 2 View; Future -     Ambulatory referral to Cardiology -     EKG 12-Lead  Bradycardia -     Ambulatory referral to Cardiology -     EKG 12-Lead  LLQ pain -     POCT Urinalysis Dipstick (Automated)  Other fatigue   B12 deficiency-on monthly injections Insomnia-chronic.  Controlled.  Continue ambien 2.5 mg hs prn.  Pdmp checked Bradycardia-chronic.  Has seen Card in past-prob from h/o being a runner.  Refer to card as some fatigue. Atypical L chest pain-EKG nsr, no acute st change.  Poss ASI.  Refer Card.  Check cxr LLQ pain-?bowel, ?muscular, other.  UA negative.  Monitor-pt concerned kidney-u/s negative  Return for as [place] in oct.   I,Rachel Rivera,acting as a scribe for Angelena Sole, MD.,have documented all relevant documentation on the behalf of Angelena Sole, MD,as directed by  Angelena Sole, MD while in the presence of Angelena Sole, MD.  I, Angelena Sole, MD, have reviewed all documentation for this visit. The documentation on 02/03/23 for the exam, diagnosis, procedures, and orders are all accurate and complete.  Angelena Sole, MD

## 2023-02-03 NOTE — Telephone Encounter (Signed)
Patient requesting to know if she should continue b12 shots and if so how often . Please advise .

## 2023-02-04 ENCOUNTER — Other Ambulatory Visit: Payer: Self-pay

## 2023-02-04 ENCOUNTER — Ambulatory Visit (INDEPENDENT_AMBULATORY_CARE_PROVIDER_SITE_OTHER)
Admission: RE | Admit: 2023-02-04 | Discharge: 2023-02-04 | Disposition: A | Discharge status: Home / Self Care | Payer: Medicare Other | Source: Ambulatory Visit | Attending: Family Medicine | Admitting: Family Medicine

## 2023-02-04 DIAGNOSIS — R0789 Other chest pain: Secondary | ICD-10-CM | POA: Diagnosis not present

## 2023-02-04 NOTE — Telephone Encounter (Signed)
Patient notified of message below and verbalized understanding. Patient stated that she will continue monthly. Scheduled next injection.

## 2023-02-04 NOTE — Telephone Encounter (Signed)
Left message to return my call.  

## 2023-02-05 ENCOUNTER — Telehealth: Payer: Self-pay | Admitting: *Deleted

## 2023-02-05 MED ORDER — OMALIZUMAB 150 MG/ML ~~LOC~~ SOSY: 300.0000 mg | PREFILLED_SYRINGE | SUBCUTANEOUS | 11 refills | Status: DC

## 2023-02-05 NOTE — Telephone Encounter (Signed)
Called patient and advised increase to every 2 weeks. Will let her know when processd so we can schedule new dosing

## 2023-02-05 NOTE — Telephone Encounter (Signed)
Called and spoke to patient increased rx sent to [facility]

## 2023-02-05 NOTE — Telephone Encounter (Signed)
-----   Message from Verlee Monte sent at 01/29/2023  3:55 PM EDT ----- Can we see about increasing her xolair to every 2 weeks for hives .Still having daily hives. Thanks

## 2023-02-06 NOTE — Telephone Encounter (Signed)
Spoke with patient, patient aware referral faxed today at [phone]  Patient will Give their office a call for appointment

## 2023-02-10 ENCOUNTER — Ambulatory Visit: Payer: Medicare Other

## 2023-02-11 ENCOUNTER — Ambulatory Visit (INDEPENDENT_AMBULATORY_CARE_PROVIDER_SITE_OTHER): Payer: Medicare Other

## 2023-02-11 ENCOUNTER — Encounter (INDEPENDENT_AMBULATORY_CARE_PROVIDER_SITE_OTHER): Payer: Self-pay

## 2023-02-11 DIAGNOSIS — L501 Idiopathic urticaria: Secondary | ICD-10-CM

## 2023-02-23 ENCOUNTER — Encounter: Payer: Self-pay | Admitting: Internal Medicine

## 2023-02-25 ENCOUNTER — Ambulatory Visit (INDEPENDENT_AMBULATORY_CARE_PROVIDER_SITE_OTHER): Payer: Medicare Other

## 2023-02-25 ENCOUNTER — Other Ambulatory Visit: Payer: Medicare Other

## 2023-02-25 DIAGNOSIS — L501 Idiopathic urticaria: Secondary | ICD-10-CM | POA: Diagnosis not present

## 2023-02-27 LAB — HM COLONOSCOPY

## 2023-03-02 ENCOUNTER — Encounter: Payer: Self-pay | Admitting: Family Medicine

## 2023-03-03 ENCOUNTER — Encounter: Payer: Self-pay | Admitting: Family Medicine

## 2023-03-04 ENCOUNTER — Ambulatory Visit (INDEPENDENT_AMBULATORY_CARE_PROVIDER_SITE_OTHER): Payer: Medicare Other | Admitting: *Deleted

## 2023-03-04 DIAGNOSIS — R300 Dysuria: Secondary | ICD-10-CM

## 2023-03-04 DIAGNOSIS — E538 Deficiency of other specified B group vitamins: Secondary | ICD-10-CM | POA: Diagnosis not present

## 2023-03-04 LAB — POC URINALSYSI DIPSTICK (AUTOMATED)
Bilirubin, UA: NEGATIVE
Blood, UA: NEGATIVE
Glucose, UA: NEGATIVE
Ketones, UA: NEGATIVE
Leukocytes, UA: NEGATIVE
Nitrite, UA: NEGATIVE
Protein, UA: NEGATIVE
Spec Grav, UA: 1.01 (ref 1.010–1.025)
Urobilinogen, UA: 0.2 E.U./dL
pH, UA: 7 (ref 5.0–8.0)

## 2023-03-04 MED ORDER — CYANOCOBALAMIN 1000 MCG/ML IJ SOLN
1000.0000 ug | Freq: Once | INTRAMUSCULAR | Status: AC
Start: 2023-03-04 — End: 2023-03-04
  Administered 2023-03-04: 1000 ug via INTRAMUSCULAR

## 2023-03-04 NOTE — Progress Notes (Signed)
Per orders of Dr. Ruthine Dose, injection of BN 12 given in right deltoid per patient preference by Jobe Gibbon, CMA. Patient tolerated injection well. Patient reminded to schedule next injection. Patient also left urine sample for possibe UTI sx.

## 2023-03-06 ENCOUNTER — Other Ambulatory Visit: Payer: Self-pay | Admitting: Family Medicine

## 2023-03-06 LAB — URINE CULTURE
MICRO NUMBER:: 15362164
SPECIMEN QUALITY:: ADEQUATE

## 2023-03-06 MED ORDER — CIPROFLOXACIN HCL 500 MG PO TABS: 500.0000 mg | ORAL_TABLET | Freq: Two times a day (BID) | ORAL | 0 refills | Status: DC

## 2023-03-06 NOTE — Progress Notes (Signed)
Needs appt-she can do evisit w/someone or if another provider has availability

## 2023-03-06 NOTE — Progress Notes (Signed)
Had c scope 1 wk ago.  2 days later, dysuria, freq.  H/o UTI.  No f/c.  Did ua and cx grew Kleb.  Will do cipro 500bid x 7d.

## 2023-03-06 NOTE — Progress Notes (Signed)
D/w pt.  Rx sent-see note

## 2023-03-09 ENCOUNTER — Telehealth: Payer: Medicare Other | Admitting: Family

## 2023-03-10 ENCOUNTER — Other Ambulatory Visit: Payer: Medicare Other

## 2023-03-12 ENCOUNTER — Ambulatory Visit (INDEPENDENT_AMBULATORY_CARE_PROVIDER_SITE_OTHER): Payer: Medicare Other

## 2023-03-12 DIAGNOSIS — L501 Idiopathic urticaria: Secondary | ICD-10-CM

## 2023-03-12 MED ORDER — OMALIZUMAB 150 MG/ML ~~LOC~~ SOSY
300.0000 mg | PREFILLED_SYRINGE | SUBCUTANEOUS | Status: AC
Start: 2023-03-12 — End: ?
  Administered 2023-03-12 – 2023-03-25 (×2): 300 mg via SUBCUTANEOUS

## 2023-03-20 ENCOUNTER — Other Ambulatory Visit (INDEPENDENT_AMBULATORY_CARE_PROVIDER_SITE_OTHER): Payer: Medicare Other

## 2023-03-20 ENCOUNTER — Encounter: Payer: Self-pay | Admitting: Internal Medicine

## 2023-03-20 DIAGNOSIS — E063 Autoimmune thyroiditis: Secondary | ICD-10-CM

## 2023-03-20 LAB — TSH: TSH: 1.9 u[IU]/mL (ref 0.35–5.50)

## 2023-03-20 LAB — T4, FREE: Free T4: 0.81 ng/dL (ref 0.60–1.60)

## 2023-03-23 NOTE — Telephone Encounter (Signed)
Please advise if she needs to come back sooner

## 2023-03-24 ENCOUNTER — Ambulatory Visit: Payer: Medicare Other | Admitting: Dietician

## 2023-03-25 ENCOUNTER — Ambulatory Visit (INDEPENDENT_AMBULATORY_CARE_PROVIDER_SITE_OTHER): Payer: Medicare Other

## 2023-03-25 DIAGNOSIS — L501 Idiopathic urticaria: Secondary | ICD-10-CM | POA: Diagnosis not present

## 2023-04-07 ENCOUNTER — Ambulatory Visit: Payer: Medicare Other | Admitting: *Deleted

## 2023-04-07 DIAGNOSIS — E538 Deficiency of other specified B group vitamins: Secondary | ICD-10-CM | POA: Diagnosis not present

## 2023-04-07 MED ORDER — CYANOCOBALAMIN 1000 MCG/ML IJ SOLN
1000.0000 ug | Freq: Once | INTRAMUSCULAR | Status: AC
Start: 2023-04-07 — End: 2023-04-07
  Administered 2023-04-07: 1000 ug via INTRAMUSCULAR

## 2023-04-07 NOTE — Progress Notes (Signed)
Per orders of Dr. Ruthine Dose, injection  12  given in left deltoid per patient preference by Jobe Gibbon, CMA. Patient tolerated injection well.

## 2023-04-30 ENCOUNTER — Encounter: Payer: Self-pay | Admitting: Internal Medicine

## 2023-04-30 ENCOUNTER — Ambulatory Visit: Payer: Medicare Other | Admitting: Internal Medicine

## 2023-04-30 VITALS — BP 102/66 | HR 54 | Temp 97.7°F | Resp 12

## 2023-04-30 DIAGNOSIS — L501 Idiopathic urticaria: Secondary | ICD-10-CM | POA: Diagnosis not present

## 2023-04-30 DIAGNOSIS — K219 Gastro-esophageal reflux disease without esophagitis: Secondary | ICD-10-CM | POA: Diagnosis not present

## 2023-04-30 DIAGNOSIS — E063 Autoimmune thyroiditis: Secondary | ICD-10-CM | POA: Diagnosis not present

## 2023-04-30 NOTE — Progress Notes (Signed)
FOLLOW UP Date of Service/Encounter:  04/30/23  Subjective:  Chelsea Vega (DOB: 07-Aug-1955) is a 67 y.o. female who [facility] in re-evaluation of the following: Chronic urticaria History obtained from: chart review and patient.  For Review, LV was on 01/29/23  with Dr.Loras Grieshop seen for routine follow-up. See below for summary of history and diagnostics.  Therapeutic plans/changes recommended: Xolair partially effective, but symptoms returning around 2 weeks.  Receiving 3 mg every 4 weeks. We did discuss increasing Xolair to every 2 weeks for better control ----------------------------------------------------- Pertinent History/Diagnostics:  Urticaria:  Started Oct 2023 a few weeks following viral illness, Associates itching, no other systemic symptoms. No triggers. No red flag symptoms, Lesions resolve in hours.  Therapies tried: triamcinolone cream, Nizoral lotion, zyrtec 1 tablets in AM zyrtec in the afternoon and benadryl 1-2 tablets in PM, ice packs. Pcitures of rash consistent with typical urticaria. - labs (09/11/22): negative CU index, negative alpha gal panel, normal CBCd, CMP, normal thyroid TSH, T4, elevated TPO, normal ESR, baseline tryptase 5.6 Diagnosed with Hashimoto thyroiditis based on labs. On selenium and LT4. Xolair started in May 2024 01/29/23-patient stopped topical estrogen and was started on levothyroxine by her endocrinologist for Hashimoto. Xolair increased to 300 mg every 2 weeks. --------------------------------------------------- Today presents for follow-up. Discussed the use of AI scribe software for clinical note transcription with the patient, who gave verbal consent to proceed.  History of Present Illness   The patient, with a history of chronic urticaria and Hashimoto's thyroiditis, has been self-administering Xolair at home every two weeks. She reports tolerating the treatment well, despite initial discomfort.   She also started levothyroxine for thyroid disease, around which time she feels that she had a decrease in her hives.  The patient had previously stopped using hormone lotion due to concerns about this being related to her hives. However, after the hives resolved, she resumed the lotion and did not experience any resurgence of hives. She has remained hive-free since mid-July.  In addition to these treatments, the patient has been following a strict dairy-free and gluten-free diet, as recommended by her endocrinologist. She has recently started reintroducing dairy into her diet, but has noticed an increase in gastrointestinal issues, including gas and GERD symptoms.  The patient is also taking antihistamines, including Xyzal in the morning and Zyrtec in the evening, to manage her allergies and reflux. She expresses a desire to start tapering off these medications, as well as the Xolair, if possible.  The patient's goal is to reduce her medication load, particularly if her hives remain under control. She is open to adjusting her diet based on how it affects her overall well-being, but is primarily concerned about managing her hives and other symptoms.      Chart Review: She has been evaluated by allergy at [facility] on 04/01/2023-confirm diagnosis of chronic urticaria.  Continued on Claritin in AM, Zyrtec in p.m., Pepcid twice daily, Xolair every 2 weeks.  Levothyroxine 25 mg as well as gluten-free and dairy free diet.  She is instructed to bring both dairy and gluten back into her diet and cut down on her Claritin and Zyrtec.  She was instructed to decrease Xolair starting in January if symptoms controlled. Her last Xolair injection was on 03/25/2023  All medications reviewed by clinical staff and updated in chart. No new pertinent medical or surgical history except as noted in HPI.  ROS: All others negative except as noted per HPI.  Objective:  BP 102/66   Pulse (!) 54   Temp 97.7 F (36.5 C)  (Temporal)   Resp 12   SpO2 97%  There is no height or weight on file to calculate BMI. Physical Exam: General Appearance:  Alert, cooperative, no distress, appears stated age  Head:  Normocephalic, without obvious abnormality, atraumatic  Eyes:  Conjunctiva clear, EOM's intact  Throat: Lips, tongue normal; teeth and gums normal, normal posterior oropharynx  Neck: Supple, symmetrical  Lungs:   clear to auscultation bilaterally, Respirations unlabored, no coughing  Heart:  regular rate and rhythm and no murmur, Appears well perfused  Extremities: No edema  Skin: Skin color, texture, turgor normal and no rashes or lesions on visualized portions of skin  Neurologic: No gross deficits   Labs:  Lab Orders  No laboratory test(s) ordered today    Assessment/Plan   Chronic Urticaria No hives since mid-July. Currently on Xolair every two weeks and antihistamines (Xyzal in the morning and Zyrtec in the evening). Discussed the possibility of tapering off medications, starting with antihistamines. -Continue Xolair every two weeks. -Start tapering off antihistamines, reducing by one every two weeks. -Revisit tapering plan after the new year.  Hashimoto's Thyroiditis Started on levothyroxine, which seemed to coincide with a reduction in hives. Currently on a gluten-free and dairy-free diet as recommended by endocrinologist. -Continue levothyroxine as prescribed by endocrinologist. -Continue dietary modifications as recommended by endocrinologist and nutritionist.  Do not suspect these are related to hives and would not avoid solely based on history of hives.  Gastroesophageal Reflux Disease (GERD) Experiencing more symptoms since reintroducing dairy into diet. Currently taking Pepcid, which helps with symptoms. -Continue Pepcid daily for GERD symptoms. -Consider further dietary modifications if dairy continues to exacerbate GERD symptoms.  Follow-up after the new year to assess progress and  discuss further tapering off medications.  Other: None  Tonny Bollman, MD  [facility]

## 2023-04-30 NOTE — Patient Instructions (Addendum)
Chronic urticaria: - Start by tapering off antihistamines. Remove 1 antihistamine every 2 weeks from regimen until you are down to only 1 Claritin daily (or antihistamine of your choice). When you are down to 1 Claritin daily and 1 Pepcid daily remain on this through the holidays. If your hives remain controlled, return to clinic and we can discuss spacing out your Xolair which we can do by spacing out by 1 to 2-week intervals.  For now, continue Xolair 300 mg every 2 weeks. If hives become uncontrolled, return to medication regimen when you were controlled.  It was a pleasure seeing you again in clinic today! Thank you for allowing me to participate in your care.  Tonny Bollman, MD [facility]

## 2023-05-04 ENCOUNTER — Ambulatory Visit: Payer: Medicare Other | Admitting: Family Medicine

## 2023-05-04 ENCOUNTER — Encounter: Payer: Medicare Other | Admitting: Family Medicine

## 2023-05-04 ENCOUNTER — Encounter: Payer: Self-pay | Admitting: Family Medicine

## 2023-05-04 VITALS — BP 108/71 | HR 54 | Temp 97.6°F | Resp 16 | Ht 65.0 in | Wt 120.1 lb

## 2023-05-04 DIAGNOSIS — Z0000 Encounter for general adult medical examination without abnormal findings: Secondary | ICD-10-CM | POA: Diagnosis not present

## 2023-05-04 DIAGNOSIS — E538 Deficiency of other specified B group vitamins: Secondary | ICD-10-CM

## 2023-05-04 DIAGNOSIS — M810 Age-related osteoporosis without current pathological fracture: Secondary | ICD-10-CM

## 2023-05-04 DIAGNOSIS — Z23 Encounter for immunization: Secondary | ICD-10-CM | POA: Diagnosis not present

## 2023-05-04 DIAGNOSIS — Z131 Encounter for screening for diabetes mellitus: Secondary | ICD-10-CM

## 2023-05-04 DIAGNOSIS — Z13220 Encounter for screening for lipoid disorders: Secondary | ICD-10-CM

## 2023-05-04 LAB — LIPID PANEL
Cholesterol: 196 mg/dL (ref 0–200)
HDL: 70.1 mg/dL (ref >39.00)
LDL Cholesterol: 112 mg/dL — ABNORMAL HIGH (ref 0–99)
NonHDL: 126.11
Total CHOL/HDL Ratio: 3
Triglycerides: 70 mg/dL (ref 0.0–149.0)
VLDL: 14 mg/dL (ref 0.0–40.0)

## 2023-05-04 LAB — GLUCOSE, RANDOM: Glucose, Bld: 79 mg/dL (ref 70–99)

## 2023-05-04 LAB — HEMOGLOBIN A1C: Hgb A1c MFr Bld: 5 % (ref 4.6–6.5)

## 2023-05-04 MED ORDER — CYANOCOBALAMIN 1000 MCG/ML IJ SOLN
1000.0000 ug | Freq: Once | INTRAMUSCULAR | Status: AC
  Administered 2023-05-04: 1000 ug via INTRAMUSCULAR

## 2023-05-04 NOTE — Patient Instructions (Addendum)
  Chelsea Vega , Thank you for taking time to come for your Medicare Wellness Visit. I appreciate your ongoing commitment to your health goals. Please review the following plan we discussed and let me know if I can assist you in the future.   These are the goals we discussed:  Goals   None     This is a list of the screening recommended for you and due dates:  Health Maintenance  Topic Date Due   Pap with HPV screening  05/15/2023*   Pneumonia Vaccine (1 of 1 - PCV) 07/07/2023*   Medicare Annual Wellness Visit  05/03/2024   Mammogram  09/28/2024   Colon Cancer Screening  02/27/2028   DTaP/Tdap/Td vaccine (3 - Td or Tdap) 06/12/2031   Flu Shot  Completed   DEXA scan (bone density measurement)  Completed   COVID-19 Vaccine  Completed   Hepatitis C Screening  Completed   HIV Screening  Completed   Zoster (Shingles) Vaccine  Completed   HPV Vaccine  Aged Out  *Topic was postponed. The date shown is not the original due date.

## 2023-05-04 NOTE — Progress Notes (Signed)
Phone: [phone]   Subjective:  Patient presents today for their Welcome to Medicare Exam    Preventive Screening-Counseling & Management  Vision screen:  Vision Screening   Right eye Left eye Both eyes  Without correction 20/30 20/30 20/20   With correction       Advanced directives: Pt will send  Modifiable Risk Factors/behavioral risk assessment/psychosocial risk assessment Regular exercise: Walking and some jogging Diet: Pt managing a healthy diet, eating healthier than before Wt Readings from Last 3 Encounters:  05/04/23 120 lb 2 oz (54.5 kg)  02/03/23 120 lb 8 oz (54.7 kg)  01/27/23 121 lb 12.8 oz (55.2 kg)   Smoking Status: Former Smoker (smoked 1/4 ppd or less for about  Second Hand Smoking status: No smokers in home Alcohol intake: 7 drinks per week, <1 a day Other substance abuse/illicit drugs: negative  Cardiac risk factors:  advanced age (older than 32 for men, 63 for women)  Hyperlipidemia: negative Hypertension: negative No diabetes.  Lab Results  Component Value Date   HGBA1C 5.0 05/04/2023    Family History:   Family History  Problem Relation Age of Onset   Cancer Mother 42       ovarian   Hyperlipidemia Mother    Anxiety disorder Mother    Asthma Mother    COPD Mother    Depression Mother    Hearing loss Mother    Miscarriages / [place] Mother    Cancer Father 64       prostate   Alcohol abuse Father    Arthritis Father    Vision loss Father    Varicose Veins Father    Arthritis Sister    Hyperlipidemia Sister    Hypertension Sister    Graves' disease Sister    Depression Sister    Hypertension Sister    Cancer Brother 38       prostate   Depression Brother    Asthma Child    Asthma Child     Depression Screen/risk evaluation Risk factors: n/a .Marland Kitchen PHQ2 2     05/04/2023    8:29 AM 02/03/2023    9:43 AM 10/30/2022    9:51 AM 04/23/2016   11:49 AM  Depression screen PHQ 2/9  Decreased Interest 1 1 1  0  Down, Depressed,  Hopeless 1 1 1  0  PHQ - 2 Score 2 2 2  0  Altered sleeping 1 1 1    Tired, decreased energy 1 2 1    Change in appetite 1 0 1   Feeling bad or failure about yourself  0 1 0   Trouble concentrating 1 1 1    Moving slowly or fidgety/restless 1 0 0   Suicidal thoughts 0 0 0   PHQ-9 Score 7 7 6    Difficult doing work/chores Somewhat difficult Somewhat difficult Somewhat difficult     Functional ability and level of safety Mobility assessment:  timed get up and go <12 seconds Activities of Daily Living- Independent in ADLs (toileting, bathing, dressing, transferring, eating) and in IADLs (shopping, housekeeping, managing own medications, and handling finances) Home Safety: Loose rugs (yes), smoke detectors (yes),  life-alert system (no) Hearing Difficulties: patient declines Fall Risk: None     05/04/2023    8:29 AM 02/03/2023    9:42 AM 10/30/2022    9:51 AM 04/23/2016   11:49 AM  Fall Risk   Falls in the past year? 0 0 0 No  Number falls in past yr: 0 0 0   Injury with Fall?  0 0 0   Risk for fall due to : No Fall Risks No Fall Risks No Fall Risks   Follow up Falls evaluation completed Falls evaluation completed Falls evaluation completed    Opioid use history:  no long term opioids use Self assessment of health status: Stable  Required Immunizations needed today:   Immunization History  Administered Date(s) Administered   Fluzone Influenza virus vaccine,trivalent (IIV3), split virus 06/20/2009, 04/21/2023   Hep A, Unspecified 07/25/2003, 02/09/2004   Influenza Inj Mdck Quad Pf 04/21/2019   Influenza Split 04/15/2010   Influenza, Seasonal, Injecte, Preservative Fre 04/17/2011   Influenza,inj,Quad PF,6+ Mos 04/16/2012, 03/28/2013, 05/05/2014, 04/10/2016   Influenza,inj,quad, With Preservative 04/08/2022   Influenza-Unspecified 05/15/2004, 04/15/2010, 04/21/2019   Moderna Sars-Covid-2 Vaccination 09/09/2019, 10/07/2019, 06/03/2020   Novel Infuenza-h1n1-09 05/20/2008   PNEUMOCOCCAL  CONJUGATE-20 05/04/2023   Pfizer Covid-19 Vaccine Bivalent Booster 31yrs & up 04/02/2021   Td (Adult) 07/25/2003   Tdap 10/27/2014, 06/11/2021   Typhoid Live 07/25/2003   Zoster Recombinant(Shingrix) 08/02/2020, 01/22/2021   Zoster, Live 07/16/2020, 08/02/2020, 03/05/2021   Zoster, Unspecified 08/02/2020, 01/22/2021   Health Maintenance  Topic Date Due   Pap with HPV screening  05/15/2023*   Medicare Annual Wellness Visit  05/03/2024   Mammogram  09/28/2024   Colon Cancer Screening  02/27/2028   DTaP/Tdap/Td vaccine (3 - Td or Tdap) 06/12/2031   Pneumonia Vaccine  Completed   Flu Shot  Completed   DEXA scan (bone density measurement)  Completed   COVID-19 Vaccine  Completed   Hepatitis C Screening  Completed   HIV Screening  Completed   Zoster (Shingles) Vaccine  Completed   HPV Vaccine  Aged Out  *Topic was postponed. The date shown is not the original due date.    Screening tests-  There are no preventive care reminders to display for this patient.  Colon cancer screening- UTD, last done 02/27/23.  Lung Cancer screening- n/a Skin cancer screening- unk 4. Cervical cancer screening- UTD 5. Breast cancer screening- UTD 6. Bone density screening - Pt plans to schedule at [facility], states last done 05/2021  The following were reviewed and entered/updated in epic: Past Medical History:  Diagnosis Date   Abnormal vaginal Pap smear    DISTANT HX- S/P LEEP   Allergic rhinitis    Allergy young child   Hay Fever   Anemia    pregnancy   Anxiety Nov. 2023   Hives have caused it to worsen   Arthritis 2006   Left ankle- chronic pain   Asthma    childhood, early 20's   Constipation    Depression    Dysrhythmia    occassional PVC's   Gallstone    GERD (gastroesophageal reflux disease)    Hx of cystitis    RECURRENT, DR. TANNENBAUM   Hyperlipidemia Sept. 2023   No need to treat   Insomnia    Osteoporosis 10/2008   DR. GREWAL, DEXA 10/2008   Postmenopausal     PVC's (premature ventricular contractions)    HX OF   Slow heart rate    Thyroid disease Nov. 2023   Elevated Thyroid antibodies   Tinnitus    Trapezius muscle spasm    Vitamin D deficiency    Patient Active Problem List   Diagnosis Date Noted   Primary insomnia 02/03/2023   Idiopathic urticaria 10/30/2022   Vitamin B6 deficiency 10/30/2022   Vitamin B12 deficiency 10/30/2022   Calculus of gallbladder without cholecystitis without obstruction 10/30/2022   Hashimoto's  thyroiditis 10/29/2022   Prolapse of female pelvic organs 05/24/2015   Ankle sprain 03/06/2011   Past Surgical History:  Procedure Laterality Date   ANKLE SURGERY Right 05/2011   repair of tendon   ANTERIOR AND POSTERIOR REPAIR N/A 05/24/2015   Procedure: ANTERIOR (CYSTOCELE) AND POSTERIOR REPAIR (RECTOCELE);  Surgeon: Marcelle Overlie, MD;  Location: [facility];  Service: Gynecology;  Laterality: N/A;   BLEPHAROPLASTY  12/2021   BREAST CYST ASPIRATION     2005   COSMETIC SURGERY  June 2023   Blepharoplasty   DIAGNOSTIC LAPAROSCOPY  07/14/2010   removed tubes and ovaries   GANGLION CYST EXCISION Right 2005   right wrist   SEPTOPLASTY  2009   VAGINAL HYSTERECTOMY  05/24/2015   Procedure: HYSTERECTOMY VAGINAL;  Surgeon: Marcelle Overlie, MD;  Location: [facility];  Service: Gynecology;;  uterus and cervix only    Family History  Problem Relation Age of Onset   Cancer Mother 37       ovarian   Hyperlipidemia Mother    Anxiety disorder Mother    Asthma Mother    COPD Mother    Depression Mother    Hearing loss Mother    Miscarriages / [place] Mother    Cancer Father 106       prostate   Alcohol abuse Father    Arthritis Father    Vision loss Father    Varicose Veins Father    Arthritis Sister    Hyperlipidemia Sister    Hypertension Sister    Graves' disease Sister    Depression Sister    Hypertension Sister    Cancer Brother 71       prostate   Depression Brother    Asthma Child    Asthma Child      Medications- reviewed and updated Current Outpatient Medications  Medication Sig Dispense Refill   Calcium Carbonate (CALTRATE 600 PO) Take 1 tablet by mouth daily.  Chewable tablet     cetirizine (ZYRTEC) 5 MG tablet Take 5 mg by mouth daily. 2 tablets in pm     cyanocobalamin (VITAMIN B12) 1000 MCG/ML injection      EPINEPHrine 0.3 mg/0.3 mL IJ SOAJ injection Inject 0.3 mg into the muscle as needed for anaphylaxis. 1 each 1   escitalopram (LEXAPRO) 5 MG tablet      Estradiol 0.52 MG/0.87 GM (0.06%) GEL Place 1 application onto the skin daily.      famotidine (PEPCID) 20 MG tablet 20 mg 2 (two) times daily.     fluticasone (FLONASE) 50 MCG/ACT nasal spray Place 1 spray into both nostrils daily.      ibuprofen (ADVIL,MOTRIN) 600 MG tablet Take 1 tablet (600 mg total) by mouth every 6 (six) hours as needed (mild pain). 60 tablet 0   levocetirizine (XYZAL) 5 MG tablet Take 5 mg by mouth every evening.     levothyroxine (SYNTHROID) 25 MCG tablet Take 1 tablet (25 mcg total) by mouth daily. 45 tablet 3   Loratadine (CLARITIN PO) Claritin     Magnesium Citrate 85 MG CHEW      omalizumab (XOLAIR) 150 MG/ML prefilled syringe Inject 300 mg into the skin every 14 (fourteen) days. 4 mL 11   Omega-3 Fatty Acids (FISH OIL PO) Take 1 tablet by mouth daily.     Probiotic Product (PROBIOTIC PO)      triamcinolone cream (KENALOG) 0.5 % Apply topically.     zolpidem (AMBIEN) 5 MG tablet Take 0.5 tablets (2.5 mg total) by  mouth at bedtime as needed. 30 tablet 2   Current Facility-Administered Medications  Medication Dose Route Frequency Provider Last Rate Last Admin   omalizumab Geoffry Paradise) prefilled syringe 300 mg  300 mg Subcutaneous Q28 days Ferol Luz, MD   300 mg at 02/25/23 1408   omalizumab Geoffry Paradise) prefilled syringe 300 mg  300 mg Subcutaneous Q14 Days Verlee Monte, MD   300 mg at 03/25/23 1451    Allergies-reviewed and updated Allergies  Allergen Reactions   Flagyl [Metronidazole]  Other (See Comments)    Dizzy    Social History   Socioeconomic History   Marital status: Married    Spouse name: Not on file   Number of children: 3   Years of education: Not on file   Highest education level: Bachelor's degree (e.g., BA, AB, BS)  Occupational History   Occupation: NURSE   Occupation: Charity fundraiser    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 at age [age].  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   Drug use: No   Sexual activity: Not Currently    Birth control/protection: Surgical  Other Topics Concern   Not on file  Social History Narrative   2 grands   Social Determinants of Health   Financial Resource Strain: Low Risk  (01/30/2023)   Overall Financial Resource Strain (CARDIA)    Difficulty of Paying Living Expenses: Not hard at all  Food Insecurity: No Food Insecurity (01/30/2023)   Hunger Vital Sign    Worried About Running Out of Food in the Last Year: Never true    Ran Out of Food in the Last Year: Never true  Transportation Needs: No Transportation Needs (01/30/2023)   PRAPARE - Administrator, Civil Service (Medical): No    Lack of Transportation (Non-Medical): No  Physical Activity: Sufficiently Active (01/30/2023)   Exercise Vital Sign    Days of Exercise per Week: 5 days    Minutes of Exercise per Session: 40 min  Stress: Stress Concern Present (01/30/2023)   [employer] of Occupational Health - Occupational Stress Questionnaire    Feeling of Stress : To some extent  Social Connections: Moderately Integrated (01/30/2023)   Social Connection and Isolation Panel  [NHANES]    Frequency of Communication with Friends and Family: Three times a week    Frequency of Social Gatherings with Friends and Family: Twice a week    Attends Religious Services: 1 to 4 times per year    Active Member of [employer] or Organizations: No    Attends Engineer, structural: Not on file    Marital Status: Married   Objective  Objective:  BP 108/71   Pulse (!) 54   Temp 97.6 F (36.4 C) (Temporal)   Resp 16   Ht 5\' 5"  (1.651 m)   Wt 120 lb 2 oz (54.5 kg)   SpO2 97%   BMI 19.99 kg/m  Gen: NAD, resting comfortably HEENT: Mucous membranes are moist. Oropharynx normal Neck: no thyromegaly CV: RRR no murmurs rubs or gallops Lungs: CTAB  no crackles, wheeze, rhonchi Abdomen: soft/nontender/nondistended/normal bowel sounds. No rebound or guarding.  Ext: no edema Skin: warm, dry Neuro: grossly normal, moves all extremities, PERRLA  EKG: nsr. No st changes.  Poor r wave progression V1/V2   Assessment and Plan:   Welcome to Medicare exam completed-  Educated, counseled and referred based on above elements Educated, counseled and referred as appropriate for preventative needs Discussed and documented a written plan for preventiative services and screenings with personalized health advice- After Visit Summary was given to patient which included this plan  4. EKG offered [id number]- done  Status of chronic or acute concerns  --Osteoporosis - currently taking vitamin D and calcium daily. Reports previous stress fracture on left foot after having surgery on right foot.  -- Hashimoto's - Pt states her condition has improved. Currently well managed and controlled.  -- Allergies - well controlled  --Shoulder pain - endorses left shoulder pain. Had had imaging done, will follow up with ortho.  --Chest "pulling"/tightness - believes this is attributed to shoulder pain  --Heartburn - states this may be GERD related, occurs occasionally when slowing down after a run.   --Nasal drainage - occasionally makes her cough, mostly at nighttime.  --Constipation - taking magnesium daily and metamucil as needed.    No problem-specific Assessment & Plan notes found for this encounter.   Recommended follow up:  Future Appointments  Date Time Provider Department Center  05/28/2023  2:00 PM Wendall Stade, MD [facility]  06/03/2023 10:00 AM [facility] NURSE [facility]  06/16/2023  9:20 AM Carlus Pavlov, MD [facility] None  07/31/2023 10:30 AM Verlee Monte, MD [facility] None  05/04/2024  9:00 AM Jeani Sow, MD [facility]     Lab/Order associations:   ICD-10-CM   1. Welcome to Medicare preventive visit  Z00.00 EKG 12-Lead    2. Osteoporosis of multiple sites  M81.0 DG BONE DENSITY (DXA)    3. Screening for lipid disorders  Z13.220 Lipid panel    4. Screening for diabetes mellitus  Z13.1 Hemoglobin A1c    Glucose    5. Need for pneumococcal vaccination  Z23 Pneumococcal conjugate vaccine 20-valent (Prevnar 20)    6. Vitamin B12 deficiency  E53.8 cyanocobalamin (VITAMIN B12) injection 1,000 mcg      Meds ordered this encounter  Medications   cyanocobalamin (VITAMIN B12) injection 1,000 mcg    Return precautions advised. Return in about 1 year (around 05/03/2024) for annual physical.   I, Isabelle Course, acting as a scribe for Angelena Sole, MD., have documented all relevant documentation on the behalf of Angelena Sole, MD, as directed by  Angelena Sole, MD while in the presence of Angelena Sole, MD.  I, Angelena Sole, MD, have reviewed all documentation for this visit. The documentation on 05/04/23 for the exam, diagnosis, procedures, and orders are all accurate and complete.     Angelena Sole, MD

## 2023-05-06 ENCOUNTER — Encounter: Payer: Self-pay | Admitting: Obstetrics and Gynecology

## 2023-05-06 NOTE — Progress Notes (Signed)
Cholesterol and sugars look great.

## 2023-05-07 ENCOUNTER — Ambulatory Visit: Payer: Medicare Other

## 2023-05-19 ENCOUNTER — Ambulatory Visit: Payer: Medicare Other | Admitting: Cardiovascular Disease

## 2023-05-21 NOTE — Progress Notes (Signed)
CARDIOLOGY CONSULT NOTE       Patient ID: Chelsea Vega MRN: [id number] DOB/AGE: [birth year] 67 y.o.  Admit date: (Not on file) Referring Physician: Ruthine Dose Primary Physician: Jeani Sow, MD Primary Cardiologist: New Reason for Consultation: Chest pain  Active Problems:   * No active hospital problems. 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  Pain is chronic and atypical Seems to be related to GERD. She is on bid pepcid. Also chronic left shoulder pain that pulls on her left pectoral area.  Seen by me 06/2017 for dizziness with no evidence of arrhythmia or structural heart dx. She is active can even jog a bit with no chest pain. Walks her dog at times She is on thyroid replacement.   ROS All other systems reviewed and negative except as noted above  Past Medical History:  Diagnosis Date   Abnormal vaginal Pap smear    DISTANT HX- S/P LEEP   Allergic rhinitis    Allergy young child   Hay Fever   Anemia    pregnancy   Anxiety Nov. 2023   Hives have caused it to worsen   Arthritis 2006   Left ankle- chronic pain   Asthma    childhood, early 20's   Constipation    Depression    Dysrhythmia    occassional PVC's   Gallstone    GERD (gastroesophageal reflux disease)    Hx of cystitis    RECURRENT, DR. TANNENBAUM   Hyperlipidemia Sept. 2023   No need to treat   Insomnia    Osteoporosis 10/2008   DR. GREWAL, DEXA 10/2008   Postmenopausal    PVC's (premature ventricular contractions)    HX OF   Slow heart rate    Thyroid disease Nov. 2023   Elevated Thyroid antibodies   Tinnitus    Trapezius muscle spasm    Vitamin D deficiency     Family History  Problem Relation Age of Onset   Cancer Mother 2       ovarian   Hyperlipidemia Mother    Anxiety disorder Mother    Asthma Mother    COPD Mother    Depression Mother    Hearing loss Mother    Miscarriages / [place] Mother    Cancer Father 41       prostate   Alcohol abuse  Father    Arthritis Father    Vision loss Father    Varicose Veins Father    Arthritis Sister    Hyperlipidemia Sister    Hypertension Sister    Graves' disease Sister    Depression Sister    Hypertension Sister    Cancer Brother 41       prostate   Depression Brother    Asthma Child    Asthma Child     Social History   Socioeconomic History   Marital status: Married    Spouse name: Not on file   Number of children: 3   Years of education: Not on file   Highest education level: Bachelor's degree (e.g., BA, AB, BS)  Occupational History   Occupation: NURSE   Occupation: Charity fundraiser    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 at age [age].  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   Drug use: No   Sexual activity: Not Currently    Birth control/protection: Surgical  Other Topics Concern   Not on file  Social History Narrative   2 grands   Social Determinants of Health   Financial Resource Strain: Low Risk  (01/30/2023)   Overall Financial Resource Strain (CARDIA)    Difficulty of Paying Living Expenses: Not hard at all  Food Insecurity: No Food Insecurity (01/30/2023)   Hunger Vital Sign    Worried About Running Out of Food in the Last Year: Never true    Ran Out of Food in the Last Year: Never true  Transportation Needs: No Transportation Needs (01/30/2023)   PRAPARE - Administrator, Civil Service (Medical): No    Lack of Transportation (Non-Medical): No  Physical Activity: Sufficiently Active (01/30/2023)   Exercise Vital Sign    Days of Exercise per Week: 5 days    Minutes of Exercise per  Session: 40 min  Stress: Stress Concern Present (01/30/2023)   [employer] of Occupational Health - Occupational Stress Questionnaire    Feeling of Stress : To some extent  Social Connections: Moderately Integrated (01/30/2023)   Social Connection and Isolation Panel [NHANES]    Frequency of Communication with Friends and Family: Three times a week    Frequency of Social Gatherings with Friends and Family: Twice a week    Attends Religious Services: 1 to 4 times per year    Active Member of [employer] or Organizations: No    Attends Engineer, structural: Not on file    Marital Status: Married  Catering manager Violence: Not on file    Past Surgical History:  Procedure Laterality Date   ANKLE SURGERY Right 05/2011   repair of tendon   ANTERIOR AND POSTERIOR REPAIR N/A 05/24/2015   Procedure: ANTERIOR (CYSTOCELE) AND POSTERIOR REPAIR (RECTOCELE);  Surgeon: Marcelle Overlie, MD;  Location: [facility];  Service: Gynecology;  Laterality: N/A;   BLEPHAROPLASTY  12/2021   BREAST CYST ASPIRATION     2005   COSMETIC SURGERY  June 2023   Blepharoplasty   DIAGNOSTIC LAPAROSCOPY  07/14/2010   removed tubes and ovaries   GANGLION CYST EXCISION Right 2005   right wrist   SEPTOPLASTY  2009   VAGINAL HYSTERECTOMY  05/24/2015   Procedure: HYSTERECTOMY VAGINAL;  Surgeon: Marcelle Overlie, MD;  Location: [facility];  Service: Gynecology;;  uterus and cervix only      Current Outpatient Medications:    Calcium Carbonate (CALTRATE 600 PO), Take 1 tablet by mouth daily.  Chewable tablet, Disp: , Rfl:    cetirizine (ZYRTEC) 10 MG tablet, Take 10 mg by mouth at bedtime., Disp: , Rfl:    cyanocobalamin (VITAMIN B12) 1000 MCG/ML injection, , Disp: , Rfl:    EPINEPHrine 0.3 mg/0.3 mL IJ SOAJ injection, Inject 0.3 mg into the muscle as needed for anaphylaxis., Disp: 1 each, Rfl: 1   escitalopram (LEXAPRO) 10 MG tablet, TAKE 1 TABLET BY MOUTH EVERY DAY FOR 90 DAYS, Disp: , Rfl:    Estradiol 0.52 MG/0.87 GM  (0.06%) GEL, Place 1 application onto the skin daily. , Disp: , Rfl:    famotidine (PEPCID) 20 MG tablet, 20 mg 2 (two) times  daily., Disp: , Rfl:    fluticasone (FLONASE) 50 MCG/ACT nasal spray, Place 1 spray into both nostrils daily. , Disp: , Rfl:    ibuprofen (ADVIL,MOTRIN) 600 MG tablet, Take 1 tablet (600 mg total) by mouth every 6 (six) hours as needed (mild pain)., Disp: 60 tablet, Rfl: 0   levothyroxine (SYNTHROID) 25 MCG tablet, Take 1 tablet (25 mcg total) by mouth daily., Disp: 45 tablet, Rfl: 3   loratadine (CLARITIN) 10 MG tablet, , Disp: , Rfl:    Magnesium Citrate 85 MG CHEW, , Disp: , Rfl:    meloxicam (MOBIC) 15 MG tablet, TAKE 1 TABLET BY MOUTH ONCE A DAY WITH MEALS, Disp: , Rfl:    omalizumab (XOLAIR) 150 MG/ML prefilled syringe, Inject 300 mg into the skin every 14 (fourteen) days., Disp: 4 mL, Rfl: 11   Omega-3 Fatty Acids (FISH OIL PO), Take 1 tablet by mouth daily., Disp: , Rfl:    Probiotic Product (PROBIOTIC PO), , Disp: , Rfl:    triamcinolone cream (KENALOG) 0.5 %, Apply topically., Disp: , Rfl:    zolpidem (AMBIEN) 5 MG tablet, Take 0.5 tablets (2.5 mg total) by mouth at bedtime as needed., Disp: 30 tablet, Rfl: 2  Current Facility-Administered Medications:    omalizumab Geoffry Paradise) prefilled syringe 300 mg, 300 mg, Subcutaneous, Q28 days, Ferol Luz, MD, 300 mg at 02/25/23 1408   omalizumab (XOLAIR) prefilled syringe 300 mg, 300 mg, Subcutaneous, Q14 Days, Verlee Monte, MD, 300 mg at 03/25/23 1451  omalizumab  300 mg Subcutaneous Q28 days   omalizumab  300 mg Subcutaneous Q14 Days     Physical Exam:  Affect appropriate Thin white female  HEENT: normal Neck supple with no adenopathy JVP normal no bruits no thyromegaly Lungs clear with no wheezing and good diaphragmatic motion Heart:  S1/S2 no murmur, no rub, gallop or click PMI normal Abdomen: benighn, BS positve, no tenderness, no AAA no bruit.  No HSM or HJR Distal pulses intact with no bruits No  edema Neuro non-focal Skin warm and dry No muscular weakness   Labs:   Lab Results  Component Value Date   WBC 6.4 09/11/2022   HGB 15.3 09/11/2022   HCT 43.9 09/11/2022   MCV 92 09/11/2022   PLT 164 05/25/2015   No results for input(s): "NA", "K", "CL", "CO2", "BUN", "CREATININE", "CALCIUM", "PROT", "BILITOT", "ALKPHOS", "ALT", "AST", "GLUCOSE" in the last 168 hours.  Invalid input(s): "LABALBU" No results found for: "CKTOTAL", "CKMB", "CKMBINDEX", "TROPONINI"  Lab Results  Component Value Date   CHOL 196 05/04/2023   Lab Results  Component Value Date   HDL 70.10 05/04/2023   Lab Results  Component Value Date   LDLCALC 112 (H) 05/04/2023   Lab Results  Component Value Date   TRIG 70.0 05/04/2023   Lab Results  Component Value Date   CHOLHDL 3 05/04/2023   No results found for: "LDLDIRECT"    Radiology: No results found.   EKG: SB rate 56 read as septal infarct but likely low lead placement    ASSESSMENT AND PLAN:   Chest Pain:  atypical ? Related to GERD and left shoulder orthopedic issues Shared decision making favor cardiac CTA to further risk stratify Check BMET   Thyroid:  on synthroid replacement TSH normal HLD:  LDL 112 any further Rx based on calcium score  GERD:  pepcid F/U [facility] colonoscopy 02/27/23 diverticulosis family history of colon cancer   Cardiac CTA can scan at 100 kV BMET HR in  low 50's no need for oral beta blocker   F/U PRN pending results   Signed: Charlton Haws 05/28/2023, 2:02 PM

## 2023-05-26 ENCOUNTER — Other Ambulatory Visit: Payer: Self-pay | Admitting: Obstetrics and Gynecology

## 2023-05-26 DIAGNOSIS — N6320 Unspecified lump in the left breast, unspecified quadrant: Secondary | ICD-10-CM

## 2023-05-28 ENCOUNTER — Encounter: Payer: Self-pay | Admitting: Cardiovascular Disease

## 2023-05-28 ENCOUNTER — Ambulatory Visit: Payer: Medicare Other | Attending: Cardiovascular Disease | Admitting: Cardiovascular Disease

## 2023-05-28 VITALS — BP 134/78 | HR 53 | Ht 65.0 in | Wt 117.2 lb

## 2023-05-28 DIAGNOSIS — E063 Autoimmune thyroiditis: Secondary | ICD-10-CM

## 2023-05-28 DIAGNOSIS — R072 Precordial pain: Secondary | ICD-10-CM

## 2023-05-28 DIAGNOSIS — K219 Gastro-esophageal reflux disease without esophagitis: Secondary | ICD-10-CM | POA: Diagnosis not present

## 2023-05-28 NOTE — Patient Instructions (Addendum)
Medication Instructions:  Your physician recommends that you continue on your current medications as directed. Please refer to the Current Medication list given to you today.  *If you need a refill on your cardiac medications before your next appointment, please call your pharmacy*  Lab Work: Your physician recommends that you have lab work today- BMET If you have labs (blood work) drawn today and your tests are completely normal, you will receive your results only by: MyChart Message (if you have MyChart) OR A paper copy in the mail If you have any lab test that is abnormal or we need to change your treatment, we will call you to review the results.  Testing/Procedures: Your physician has requested that you have cardiac CT. Cardiac computed tomography (CT) is a painless test that uses an x-ray machine to take clear, detailed pictures of your heart. For further information please visit [email or web address] Please follow instruction sheet as given  Follow-Up: At [facility], you and your health needs are our priority.  As part of our continuing mission to provide you with exceptional heart care, we have created designated Provider Care Teams.  These Care Teams include your primary Cardiologist (physician) and Advanced Practice Providers (APPs -  Physician Assistants and Nurse Practitioners) who all work together to provide you with the care you need, when you need it.  We recommend signing up for the patient portal called "MyChart".  Sign up information is provided on this After Visit Summary.  MyChart is used to connect with patients for Virtual Visits (Telemedicine).  Patients are able to view lab/test results, encounter notes, upcoming appointments, etc.  Non-urgent messages can be sent to your provider as well.   To learn more about what you can do with MyChart, go to [email or web address].    Your next appointment: As needed  Provider:   Charlton Haws, MD     Other  Instructions   Your cardiac CT will be scheduled at one of the below locations:   [facility] [address] [place], [place] [address] [phone]   If scheduled at [facility], please arrive at the [facility] (Entrance C2) of [facility] 30 minutes prior to test start time. You can use the FREE valet parking offered at entrance C (encouraged to control the heart rate for the test)  Proceed to the [facility] Radiology Department (first floor) to check-in and test prep.  All radiology patients and guests should use entrance C2 at [facility], accessed from [facility], even though the hospital's physical address listed is [address].     Please follow these instructions carefully (unless otherwise directed):  On the Night Before the Test: Be sure to Drink plenty of water. Do not consume any caffeinated/decaffeinated beverages or chocolate 12 hours prior to your test. Do not take any antihistamines 12 hours prior to your test.  On the Day of the Test: Drink plenty of water until 1 hour prior to the test. Do not eat any food 1 hour prior to test. You may take your regular medications prior to the test.  FEMALES- please wear underwire-free bra if available, avoid dresses & tight clothing   After the Test: Drink plenty of water. After receiving IV contrast, you may experience a mild flushed feeling. This is normal. On occasion, you may experience a mild rash up to 24 hours after the test. This is not dangerous. If this occurs, you can take Benadryl  25 mg and increase your fluid intake. If you experience trouble breathing, this can be serious. If it is severe call 911 IMMEDIATELY. If it is mild, please call our office.  We will call to schedule your test 2-4 weeks out understanding that some insurance companies will need an authorization prior to the service being performed.   For more information  and frequently asked questions, please visit our website : [email or web address]  For non-scheduling related questions, please contact the cardiac imaging nurse navigator should you have any questions/concerns: Cardiac Imaging Nurse Navigators Direct Office Dial: [phone]   For scheduling needs, including cancellations and rescheduling, please call [place], [phone].

## 2023-05-29 LAB — BASIC METABOLIC PANEL
BUN/Creatinine Ratio: 17 (ref 12–28)
BUN: 14 mg/dL (ref 8–27)
CO2: 23 mmol/L (ref 20–29)
Calcium: 9.9 mg/dL (ref 8.7–10.3)
Chloride: 101 mmol/L (ref 96–106)
Creatinine, Ser: 0.82 mg/dL (ref 0.57–1.00)
Glucose: 79 mg/dL (ref 70–99)
Potassium: 4.6 mmol/L (ref 3.5–5.2)
Sodium: 144 mmol/L (ref 134–144)
eGFR: 79 mL/min/{1.73_m2} (ref >59)

## 2023-06-03 ENCOUNTER — Ambulatory Visit (INDEPENDENT_AMBULATORY_CARE_PROVIDER_SITE_OTHER): Payer: Medicare Other

## 2023-06-03 DIAGNOSIS — E538 Deficiency of other specified B group vitamins: Secondary | ICD-10-CM | POA: Diagnosis not present

## 2023-06-03 MED ORDER — CYANOCOBALAMIN 1000 MCG/ML IJ SOLN
1000.0000 ug | Freq: Once | INTRAMUSCULAR | Status: AC
  Administered 2023-06-03: 1000 ug via INTRAMUSCULAR

## 2023-06-03 NOTE — Progress Notes (Signed)
Patient is in office today for a nurse visit for B12 Injection, per PCP's order. Patient Injection was given in the  Right deltoid. Patient tolerated injection well.

## 2023-06-04 ENCOUNTER — Encounter: Payer: Self-pay | Admitting: Cardiovascular Disease

## 2023-06-05 ENCOUNTER — Encounter (HOSPITAL_COMMUNITY): Payer: Self-pay

## 2023-06-05 ENCOUNTER — Telehealth (HOSPITAL_COMMUNITY): Payer: Self-pay | Admitting: *Deleted

## 2023-06-05 NOTE — Telephone Encounter (Signed)
Reaching out to patient to offer assistance regarding upcoming cardiac imaging study; pt verbalizes understanding of appt date/time, parking situation and where to check in, pre-test NPO status, and verified current allergies; name and call back number provided for further questions should they arise  Larey Brick RN Navigator Cardiac Imaging [facility] [phone] office [phone] cell  Patient aware to arrive at 1:30 PM.

## 2023-06-08 ENCOUNTER — Ambulatory Visit
Admission: RE | Admit: 2023-06-08 | Discharge: 2023-06-08 | Disposition: A | Discharge status: Home / Self Care | Payer: Medicare Other | Source: Ambulatory Visit | Attending: Obstetrics and Gynecology | Admitting: Obstetrics and Gynecology

## 2023-06-08 ENCOUNTER — Ambulatory Visit (HOSPITAL_COMMUNITY)
Admission: RE | Admit: 2023-06-08 | Discharge: 2023-06-08 | Disposition: A | Discharge status: Home / Self Care | Payer: Medicare Other | Source: Ambulatory Visit | Attending: Cardiovascular Disease | Admitting: Cardiovascular Disease

## 2023-06-08 DIAGNOSIS — N6320 Unspecified lump in the left breast, unspecified quadrant: Secondary | ICD-10-CM

## 2023-06-08 DIAGNOSIS — R072 Precordial pain: Secondary | ICD-10-CM | POA: Diagnosis not present

## 2023-06-08 MED ORDER — NITROGLYCERIN 0.4 MG SL SUBL
0.8000 mg | SUBLINGUAL_TABLET | Freq: Once | SUBLINGUAL | Status: AC
Start: 2023-06-08 — End: 2023-06-08
  Administered 2023-06-08: 0.8 mg via SUBLINGUAL

## 2023-06-08 MED ORDER — NITROGLYCERIN 0.4 MG SL SUBL
SUBLINGUAL_TABLET | SUBLINGUAL | Status: AC
  Filled 2023-06-08: qty 2

## 2023-06-08 MED ORDER — IOHEXOL 350 MG/ML SOLN
95.0000 mL | Freq: Once | INTRAVENOUS | Status: AC | PRN
  Administered 2023-06-08: 95 mL via INTRAVENOUS

## 2023-06-16 ENCOUNTER — Ambulatory Visit (INDEPENDENT_AMBULATORY_CARE_PROVIDER_SITE_OTHER): Payer: Medicare Other | Admitting: Internal Medicine

## 2023-06-16 ENCOUNTER — Encounter: Payer: Self-pay | Admitting: Internal Medicine

## 2023-06-16 VITALS — BP 120/80 | HR 65 | Ht 65.0 in | Wt 119.6 lb

## 2023-06-16 DIAGNOSIS — E673 Hypervitaminosis D: Secondary | ICD-10-CM

## 2023-06-16 DIAGNOSIS — E063 Autoimmune thyroiditis: Secondary | ICD-10-CM | POA: Diagnosis not present

## 2023-06-16 DIAGNOSIS — L508 Other urticaria: Secondary | ICD-10-CM

## 2023-06-16 NOTE — Patient Instructions (Addendum)
Please continue levothyroxine 25 mcg daily.  Take the thyroid hormone every day, with water, at least 30 minutes before breakfast, separated by at least 4 hours from: - acid reflux medications - calcium - iron - multivitamins  Continue vitamin D 2000 units daily.  Please stop at the lab.  Please return in 6  months.

## 2023-06-16 NOTE — Progress Notes (Unsigned)
Patient ID: JOREY WILSEY, female   DOB: [birth year], 67 y.o.   MRN: [id number]  HPI  BROCHA VERGA is a 67 y.o.-year-old female, initially referred by her PCP, Dr. Joycelyn Rua, returning for follow-up for Hashimoto's thyroiditis and urticaria.  Interim history: Her pruritus has completely resolved.  She still has some dry skin. She continues to see allergology. She is reparing to go off Xolair. She has been more fatigued lately. She had CP and was evaluated by cardiology with an EKG and heart CT >> CAC 0. This is considered to be related GERD  Reviewed and addended history: Patient describes she initially had a URI in 05/2022.  She had cough and time but also developed itching and a rash in an 10 cm area in her lower back.  Cough was treated with a Z-Pak and the rash with triamcinolone cream and they both improved.  However, the rash then reappeared in the same spot and in 07/2022 it started to spread to legs, arms, breast, stomach and then worsened in 08/2022, when it included her scalp.  Recently, he started to have falls.  She is scratching excessively at night and has to sleep with ice packs.  During the day, the itching is better, but it worsens towards the evening, especially if she is more active.  She actually has bruises on her body due to scratching.  Patient also describes that she has anxiety, gets hot, and has nausea when the itching is worse.  Saw allergist, who obtained a normal tryptase and a normal alpha-gal test.  LFTs were normal.  Hemoglobin was also normal.  She did not have elevated eosinophiles.  Other possible causes for chronic urticaria were investigated with negative results (chronic urticaria index <2.9).  She is currently on Zyrtec and Pepcid and as needed steroid cream.  During this investigation, she has been dx'ed with Hashimoto's thyroiditis hypothyroidism in 08/2022 by elevated TPO antibodies; she is not on Levothyroxine as her thyroid function tests are normal.   She sees Dr. Vincente Poli (OB/GYN) and she suggested a referral to endocrinology.  Patient has idiopathic urticaria and her TFTs and thyroid antibodies were checked during investigation for this condition.  She continues to have hives despite taking antihistamines.  As mentioned above, a tryptase level was normal.  10/2022: Started selenium in an effort to decrease her TPO antibodies. At that time, we also decrease vitamin D supplement as her level was elevated.  01/2023: Started LT4 low-dose 25 mcg daily to see if this would help improve pruritus At that time, also stopped her estrogen gel (was on this for many years).  End of 01/2023: Rash and itching resolved  02/2023: Restarting estrogen general diet without return of pruritus.  She is currently on: - Selenium 200 mcg daily - started 10/2022 - Levothyroxine 25 mcg daily - started 01/2023  She takes this: - in am - fasting - at least 30 min from b'fast - no calcium - no iron - no multivitamins - no PPIs, + Pepcid 4h later - not on Biotin  I reviewed pt's thyroid tests: Lab Results  Component Value Date   TSH 1.90 03/20/2023   TSH 2.21 01/12/2023   TSH 1.56 10/29/2022   TSH 2.410 09/11/2022   TSH 1.68 08/11/2006   FREET4 0.81 03/20/2023   FREET4 0.65 01/12/2023   FREET4 0.75 10/29/2022    Component     Latest Ref Rng 09/11/2022  Thyroperoxidase Ab SerPl-aCnc     0 - 34 IU/mL  43 (H)     Pt denies feeling nodules in neck, hoarseness, dysphagia/odynophagia. A neck U/S (03/03/2019) was normal.  She has + FH of thyroid disorders in: sister - Graves ds.. No FH of thyroid cancer. + FH of autoimmune ds. - Raynaud's phenomenon. No h/o radiation tx to head or neck. No recent use of iodine supplements.   High vitamin D:  At last visit, patient was taking 10,000 units vitamin D daily.  Vitamin D level was elevated so I advised her to stop the supplement.  She then restarted 2000 units daily - 01/2023.  Reviewed Vitamin D  levels: Lab Results  Component Value Date   VD25OH 57.05 01/06/2023   VD25OH 106.00 (HH) 10/29/2022   Pt. 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 She is currently off her estrogen and other supplements to see if pruritus improves. She has B12 deficiency and is on IM B12. She had an episode of right upper quadrant pain summer 2024 after eating greasy food >> RUQ U/S showed cholelithiasis without cholecystitis.  She was advised that if she developed further episodes, she might need to have a HIDA scan.  ROS: + See HPI No nausea/+ acid reflux + muscle/joint aches in left ankle and left shoulder  PMH: Past Medical History:  Diagnosis Date   Abnormal vaginal Pap smear    DISTANT HX- S/P LEEP   Allergic rhinitis    Allergy young child   Hay Fever   Anemia    pregnancy   Anxiety Nov. 2023   Hives have caused it to worsen   Arthritis 2006   Left ankle- chronic pain   Asthma    childhood, early 20's   Constipation    Depression    Dysrhythmia    occassional PVC's   Gallstone    GERD (gastroesophageal reflux disease)    Hx of cystitis    RECURRENT, DR. TANNENBAUM   Hyperlipidemia Sept. 2023   No need to treat   Insomnia    Osteoporosis 10/2008   DR. GREWAL, DEXA 10/2008   Postmenopausal    PVC's (premature ventricular contractions)    HX OF   Slow heart rate    Thyroid disease Nov. 2023   Elevated Thyroid antibodies   Tinnitus    Trapezius muscle spasm    Vitamin D deficiency    Past Surgical History:  Procedure Laterality Date   ANKLE SURGERY Right 05/2011   repair of tendon   ANTERIOR AND POSTERIOR REPAIR N/A 05/24/2015   Procedure: ANTERIOR (CYSTOCELE) AND POSTERIOR REPAIR (RECTOCELE);  Surgeon: Marcelle Overlie, MD;  Location: [facility];  Service: Gynecology;  Laterality: N/A;   BLEPHAROPLASTY  12/2021   BREAST CYST ASPIRATION     2005    COSMETIC SURGERY  June 2023   Blepharoplasty   DIAGNOSTIC LAPAROSCOPY  07/14/2010   removed tubes and ovaries   GANGLION CYST EXCISION Right 2005   right wrist   SEPTOPLASTY  2009   VAGINAL HYSTERECTOMY  05/24/2015   Procedure: HYSTERECTOMY VAGINAL;  Surgeon: Marcelle Overlie, MD;  Location: [facility];  Service: Gynecology;;  uterus and cervix only   Social History   Socioeconomic History   Marital status: Married    Spouse name: Not on file   Number of children: 3   Years of education: Not on file   Highest education level: Bachelor's degree (e.g., BA, AB, BS)  Occupational History   Occupation: NURSE  Occupation: Charity fundraiser    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 at age [age].  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   Drug use: No   Sexual activity: Not Currently    Birth control/protection: Surgical  Other Topics Concern   Not on file  Social History Narrative   2 grands   Social Determinants of Health   Financial Resource Strain: Low Risk  (01/30/2023)   Overall Financial Resource Strain (CARDIA)    Difficulty of Paying Living Expenses: Not hard at all  Food Insecurity: No Food Insecurity (01/30/2023)   Hunger Vital Sign    Worried About Running Out of Food in the Last Year: Never true    Ran Out of Food in the Last Year: Never true  Transportation Needs: No Transportation Needs (01/30/2023)   PRAPARE - Administrator, Civil Service (Medical): No    Lack of Transportation (Non-Medical): No  Physical Activity: Sufficiently Active (01/30/2023)   Exercise Vital Sign    Days of Exercise per  Week: 5 days    Minutes of Exercise per Session: 40 min  Stress: Stress Concern Present (01/30/2023)   [employer] of Occupational Health - Occupational Stress Questionnaire    Feeling of Stress : To some extent  Social Connections: Moderately Integrated (01/30/2023)   Social Connection and Isolation Panel [NHANES]    Frequency of Communication with Friends and Family: Three times a week    Frequency of Social Gatherings with Friends and Family: Twice a week    Attends Religious Services: 1 to 4 times per year    Active Member of [employer] or Organizations: No    Attends Engineer, structural: Not on file    Marital Status: Married  Catering manager Violence: Not on file   Current Outpatient Medications on File Prior to Visit  Medication Sig Dispense Refill   Calcium Carbonate (CALTRATE 600 PO) Take 1 tablet by mouth daily.  Chewable tablet     cetirizine (ZYRTEC) 10 MG tablet Take 10 mg by mouth at bedtime.     cyanocobalamin (VITAMIN B12) 1000 MCG/ML injection      EPINEPHrine 0.3 mg/0.3 mL IJ SOAJ injection Inject 0.3 mg into the muscle as needed for anaphylaxis. 1 each 1   escitalopram (LEXAPRO) 10 MG tablet TAKE 1 TABLET BY MOUTH EVERY DAY FOR 90 DAYS     Estradiol 0.52 MG/0.87 GM (0.06%) GEL Place 1 application onto the skin daily.      famotidine (PEPCID) 20 MG tablet 20 mg 2 (two) times daily.     fluticasone (FLONASE) 50 MCG/ACT nasal spray Place 1 spray into both nostrils daily.      ibuprofen (ADVIL,MOTRIN) 600 MG tablet Take 1 tablet (600 mg total) by mouth every 6 (six) hours as needed (mild pain). 60 tablet 0   levothyroxine (SYNTHROID) 25 MCG tablet Take 1 tablet (25 mcg total) by mouth daily. 45 tablet 3   loratadine (CLARITIN) 10 MG tablet      Magnesium Citrate 85 MG CHEW  meloxicam (MOBIC) 15 MG tablet TAKE 1 TABLET BY MOUTH ONCE A DAY WITH MEALS     omalizumab (XOLAIR) 150 MG/ML prefilled syringe Inject 300 mg into the skin every 14 (fourteen) days. 4 mL  11   Omega-3 Fatty Acids (FISH OIL PO) Take 1 tablet by mouth daily.     Probiotic Product (PROBIOTIC PO)      triamcinolone cream (KENALOG) 0.5 % Apply topically.     zolpidem (AMBIEN) 5 MG tablet Take 0.5 tablets (2.5 mg total) by mouth at bedtime as needed. 30 tablet 2   Current Facility-Administered Medications on File Prior to Visit  Medication Dose Route Frequency Provider Last Rate Last Admin   omalizumab Geoffry Paradise) prefilled syringe 300 mg  300 mg Subcutaneous Q28 days Ferol Luz, MD   300 mg at 02/25/23 1408   omalizumab Geoffry Paradise) prefilled syringe 300 mg  300 mg Subcutaneous Q14 Days Verlee Monte, MD   300 mg at 03/25/23 1451   Allergies  Allergen Reactions   Flagyl [Metronidazole] Other (See Comments)    Dizzy   Family History  Problem Relation Age of Onset   Cancer Mother 30       ovarian   Hyperlipidemia Mother    Anxiety disorder Mother    Asthma Mother    COPD Mother    Depression Mother    Hearing loss Mother    Miscarriages / [place] Mother    Cancer Father 84       prostate   Alcohol abuse Father    Arthritis Father    Vision loss Father    Varicose Veins Father    Arthritis Sister    Hyperlipidemia Sister    Hypertension Sister    Graves' disease Sister    Depression Sister    Hypertension Sister    Cancer Brother 2       prostate   Depression Brother    Asthma Child    Asthma Child    PE: BP 120/80   Pulse 65   Ht 5\' 5"  (1.651 m)   Wt 119 lb 9.6 oz (54.3 kg)   SpO2 98%   BMI 19.90 kg/m  Wt Readings from Last 3 Encounters:  06/16/23 119 lb 9.6 oz (54.3 kg)  05/28/23 117 lb 3.2 oz (53.2 kg)  05/04/23 120 lb 2 oz (54.5 kg)   Constitutional: normal weight, in NAD Eyes:  EOMI, no exophthalmos ENT: no neck masses, no cervical lymphadenopathy Cardiovascular: RRR, No MRG Respiratory: CTA B Musculoskeletal: no deformities Skin:  no rashes anymore Neurological: no tremor with outstretched hands  ASSESSMENT: 1. Hashimoto  thyroiditis  2. Chronic urticaria  3. High vitamin D  PLAN: 1. Hashimoto thyroiditis -Currently on selenium and we also started LT4 to see if this could help with her urticaria - latest thyroid labs reviewed with pt. >> normal: Lab Results  Component Value Date   TSH 1.90 03/20/2023  - she continues on LT4 25 mcg daily - pt feels good on this dose. - we discussed about taking the thyroid hormone every day, with water, >30 minutes before breakfast, separated by >4 hours from acid reflux medications, calcium, iron, multivitamins. Pt. is taking it correctly. - will check thyroid tests today: TSH and fT4 - If labs are abnormal, she will need to return for repeat TFTs in 1.5 months  2.  Chronic urticaria -For the last year -When I first saw her, she had significant impairment of her quality of life due to this: Itching included  the scalp and the entire body, including palms.  It was worse at night as she was not able to control scratching.  She also had bruises from this.  She had decreased appetite and weight loss.  Reviewed previous workup: -Systemic mastocytosis was ruled out with a normal tryptase -Cholestasis was ruled out with normal LFTs -Alpha-gal allergy was ruled out with a normal alpha gal panel -Anemia was ruled out with a normal hemoglobin -A parasitic infection was ruled out by normal eosinophils -She also had a normal chronic urticaria panel -She did not feel that any medications that she is taking could be causing this.  She takes occasional Ambien low-dose, but symptoms are not correlating with this. -We started selenium at last visit to lower her antibody titer -this did not help much -Her vitamin D level was elevated so we stopped her supplement dose and latest level normalized.  She restarted a lower dose of vitamin D, 2000 units daily.  Of note, she did not have high calcium levels. -In 01/2023, we started LT4 low dose to see if this can help.  Around the same time,  she stopped her estrogen gel.  She ended up restarting estrogen since then, without return of pruritus.  -We ended up starting the low-dose levothyroxine for her diagnosis of Hashimoto's thyroiditis.  Her pruritus improved significantly afterwards.  3. High vitamin D level - vit D was elevated >100 >> we stopped her vit D supplement - now restarted 2000 international units  daily - will recheck vit D today  Needs LT4 refills.  Carlus Pavlov, MD PhD [facility] Endocrinology

## 2023-06-17 ENCOUNTER — Encounter: Payer: Self-pay | Admitting: Internal Medicine

## 2023-06-17 LAB — VITAMIN D 25 HYDROXY (VIT D DEFICIENCY, FRACTURES): Vit D, 25-Hydroxy: 67 ng/mL (ref 30–100)

## 2023-06-17 LAB — TSH: TSH: 2.14 m[IU]/L (ref 0.40–4.50)

## 2023-06-17 LAB — T4, FREE: Free T4: 0.8 ng/dL (ref 0.8–1.8)

## 2023-06-17 MED ORDER — LEVOTHYROXINE SODIUM 25 MCG PO TABS: 25.0000 ug | ORAL_TABLET | Freq: Every day | ORAL | 3 refills | Status: DC

## 2023-06-23 ENCOUNTER — Encounter: Payer: Self-pay | Admitting: Cardiovascular Disease

## 2023-06-25 ENCOUNTER — Encounter: Payer: Medicare Other | Attending: Family Medicine | Admitting: Dietician

## 2023-06-25 ENCOUNTER — Encounter: Payer: Self-pay | Admitting: Dietician

## 2023-06-25 VITALS — Ht 65.0 in | Wt 117.4 lb

## 2023-06-25 DIAGNOSIS — E063 Autoimmune thyroiditis: Secondary | ICD-10-CM | POA: Insufficient documentation

## 2023-06-25 NOTE — Progress Notes (Signed)
Medical Nutrition Therapy Employee Wellness Visit  Appointment Start time:  8:00  Appointment End time:  9:09  Referral diagnosis: Hashimoto's Thyroiditis (E06.3) Preferred learning style: no preference indicated (auditory, visual, hands on, no preference indicated) Learning readiness: ready (not ready, contemplating, ready, change in progress)  NUTRITION ASSESSMENT  Anthropometrics  Weight: 117.4 Height: 65 in  Body Composition Scale 06/25/2023  Current Body Weight 117.4  Total Body Fat % 22.6  Visceral Fat 3  Fat-Free Mass % 77.3   Total Body Water % 53.1  Muscle-Mass lbs 27.8  BMI 19.2  Body Fat Displacement          Torso  lbs 16.2         Left Leg  lbs 3.2         Right Leg  lbs 3.2         Left Arm  lbs 1.6         Right Arm  lbs 1.6   Clinical Medical Hx: GERD, Hashimoto, Hives Medications: zolair (once a month) injection every two weeks, 300 mg), Pepcid twice a day, hormone therapy, zyrtec Labs: LDL 112 Notable Signs/Symptoms: none noted  Lifestyle & Dietary Hx  Pt states she has Hashimoto Pt states she was having hives but states they are under control now. Pt states her thyroid antibodies were out of wack (T4). Pt states she had a cardiac visit, stating the cardiac CT came back good.  Pt states she is gluten free, stating she has GI issues like gas and bloating, sometimes some pain. Pt states she is chronically constipated. Pt states she has GERD. Pt states she stays away from peanuts and black beans. Pt states chocolate and chicken broth will give her nausea. Pt states she has used miralax in the past, stating she feels like it made it worse. Pt states she has had some recent weight loss.  Pt states she is going to follow up with her allergist and GI doctor.  Estimated daily fluid intake: 48 oz Supplements: bone builder chewable, magnesium citrate, Vit D + K, SPM Active, beef gelatin powder,There-Biotic, GI Enzyme blend with betaine HCL, B12 shots once a  month Sleep: sleeps okay, has Ambien if needed, stating she might take it once every two weeks. Stress / self-care: 5-6 on a scale of 1-10; walking Current average weekly physical activity: walk 2-3 miles a day, daily  24-Hr Dietary Recall First Meal: coconut yogurt, fruit (berries), granola Snack:  Second Meal: oatmeal protein bar or carrots or crackers (nut thin or rice crackers) with cheese or mixed nuts or soup or left overs Snack:  Third Meal: protein, vegetable, rice Snack: grain free tortilla chips or pumpkin seeds or pecans Beverages: water, coffee, seltzer water with cranberry juice, wine (1-2 glasses 4 days a week)  NUTRITION DIAGNOSIS  NB-1.1 Food and nutrition-related knowledge deficit As related to calorie and protein intake.  As evidenced by food recall and recent weight loss.  NUTRITION INTERVENTION  Nutrition education (E-1) on the following topics:  Protein is an important food group to include in your diet. Your body needs protein to build bones, muscles, cartilage, and skin. It also needs protein to repair cells and tissues, make and regulate hormones, supply oxygen to blood and other key areas, and aid in digestion. A protein is formed out of 20 different types of amino acids (organic compounds) all connected to each other. Our body makes 11 types of amino acids on its own. But we need to get the  remaining nine types of amino acids, called "essential amino acids" through other sources, such as the food we eat. Encouraged pt to continue to eat balanced meals inclusive of non starchy vegetables 2 times a day 7 days a week Encouraged pt to choose lean protein sources Encourage pt to choose healthy fats such as plant based limiting animal fats Encouraged pt to continue to drink a minium 64 fluid ounces with half being plain water to satisfy proper hydration   Health Benefits of Physical Activity; aerobic and resistance training; resistance exercises to maintain muscle  mass.  Handouts Provided Include  Meal Ideas Protein foods list (with protein value)  Learning Style & Readiness for Change Teaching method utilized: Visual & Auditory  Demonstrated degree of understanding via: Teach Back  Barriers to learning/adherence to lifestyle change: nothing identified  Goals Established by Pt Add resistance training to physical activity: aim 2-3 days per week Track protein; aim for 60 grams above the collagen supplements  MONITORING & EVALUATION Dietary intake, weekly physical activity.  Next Steps  Patient is to return in 2-3 weeks for follow-up.

## 2023-06-30 ENCOUNTER — Ambulatory Visit (INDEPENDENT_AMBULATORY_CARE_PROVIDER_SITE_OTHER): Payer: Medicare Other | Admitting: *Deleted

## 2023-06-30 DIAGNOSIS — E538 Deficiency of other specified B group vitamins: Secondary | ICD-10-CM | POA: Diagnosis not present

## 2023-06-30 MED ORDER — CYANOCOBALAMIN 1000 MCG/ML IJ SOLN
1000.0000 ug | Freq: Once | INTRAMUSCULAR | Status: AC
  Administered 2023-06-30: 1000 ug via INTRAMUSCULAR

## 2023-06-30 NOTE — Progress Notes (Signed)
Patient presents for B12 injection today. Patient received her B12 injection in left Deltoid. Patient tolerated injection well.  Documentation entered in [facility] in [employer].   Advise to schedule a lab appt in about 2 week to recheck B12 level

## 2023-07-13 ENCOUNTER — Encounter: Payer: Self-pay | Admitting: Dietician

## 2023-07-13 ENCOUNTER — Encounter: Payer: Medicare Other | Attending: Family Medicine | Admitting: Dietician

## 2023-07-13 VITALS — Ht 65.0 in | Wt 118.5 lb

## 2023-07-13 DIAGNOSIS — E063 Autoimmune thyroiditis: Secondary | ICD-10-CM | POA: Insufficient documentation

## 2023-07-13 NOTE — Progress Notes (Signed)
Medical Nutrition Therapy Employee Wellness Visit  Appointment Start time:  [phone]  Appointment End time:  [phone]  Referral diagnosis: Hashimoto's Thyroiditis (E06.3) Preferred learning style: no preference indicated (auditory, visual, hands on, no preference indicated) Learning readiness: ready (not ready, contemplating, ready, change in progress)  NUTRITION ASSESSMENT  Anthropometrics  Weight: 118.5 lb Height: 65 in  Body Composition Scale 06/25/2023 07/13/2023  Current Body Weight 117.4 118.5  Total Body Fat % 22.6 23.0  Visceral Fat 3 4  Fat-Free Mass % 77.3 76.9   Total Body Water % 53.1 52.9  Muscle-Mass lbs 27.8 27.9  BMI 19.2 19.3  Body Fat Displacement           Torso  lbs 16.2 16.7         Left Leg  lbs 3.2 3.3         Right Leg  lbs 3.2 3.3         Left Arm  lbs 1.6 1.6         Right Arm  lbs 1.6 1.6   Clinical Medical Hx: GERD, Hashimoto, Hives Medications: zolair (once a month) injection every two weeks, 300 mg), Pepcid twice a day, hormone therapy, zyrtec Labs: LDL 112 Notable Signs/Symptoms: none noted  Lifestyle & Dietary Hx  Pt states with the holidays, she has not been able to start her goals. Pt brought in a book from a PhamD that has had the same symptoms she has had, from Hashimoto and hives. Pt states she is going to follow up with her allergist (07/26/2023) and GI doctor. Pt states she is gluten free, stating she has GI issues like gas and bloating, sometimes some pain. Pt states she is chronically constipated. Pt states she has GERD. Pt states she stays away from peanuts and black beans. Pt states chocolate and chicken broth will give her nausea. Pt states she has used miralax in the past, stating she feels like it made it worse. Pt states she has had some recent weight loss.  Estimated daily fluid intake: 48 oz Supplements: bone builder chewable, magnesium citrate, Vit D + K, SPM Active, beef gelatin powder,There-Biotic, GI Enzyme blend with betaine HCL,  B12 shots once a month Sleep: sleeps okay, has Ambien if needed, stating she might take it once every two weeks. Stress / self-care: 5-6 on a scale of 1-10; walking Current average weekly physical activity: walk 2-3 miles a day, daily  24-Hr Dietary Recall First Meal: coconut yogurt, fruit (berries), granola Snack:  Second Meal: oatmeal protein bar or carrots or crackers (nut thin or rice crackers) with cheese or mixed nuts or soup or left overs Snack:  Third Meal: protein, vegetable, rice Snack: grain free tortilla chips or pumpkin seeds or pecans Beverages: water, coffee, seltzer water with cranberry juice, wine (1-2 glasses 4 days a week)  NUTRITION DIAGNOSIS  NB-1.1 Food and nutrition-related knowledge deficit As related to calorie and protein intake.  As evidenced by food recall and recent weight loss.  NUTRITION INTERVENTION  Nutrition education (E-1) on the following topics:  Protein is an important food group to include in your diet. Your body needs protein to build bones, muscles, cartilage, and skin. It also needs protein to repair cells and tissues, make and regulate hormones, supply oxygen to blood and other key areas, and aid in digestion. A protein is formed out of 20 different types of amino acids (organic compounds) all connected to each other. Our body makes 11 types of amino acids on its  own. But we need to get the remaining nine types of amino acids, called "essential amino acids" through other sources, such as the food we eat. Encouraged pt to continue to eat balanced meals inclusive of non starchy vegetables 2 times a day 7 days a week Encouraged pt to choose lean protein sources Encourage pt to choose healthy fats such as plant based limiting animal fats Encouraged pt to continue to drink a minium 64 fluid ounces with half being plain water to satisfy proper hydration   Health Benefits of Physical Activity; aerobic and resistance training; resistance exercises to  maintain muscle mass.  Handouts Provided Include    Learning Style & Readiness for Change Teaching method utilized: Visual & Auditory  Demonstrated degree of understanding via: Teach Back  Barriers to learning/adherence to lifestyle change: nothing identified  Goals Established by Pt Re-engage: add resistance training to physical activity: aim 2-3 days per week Re-engage: track protein; aim for 60 grams above the collagen supplements New: focus on what you can eat and not what you can't, due to allergies and avoid weight loss.  MONITORING & EVALUATION Dietary intake, weekly physical activity.  Next Steps  Patient is to return in 6 months for follow-up.

## 2023-07-14 ENCOUNTER — Encounter: Payer: Self-pay | Admitting: Family Medicine

## 2023-07-16 ENCOUNTER — Encounter: Payer: Self-pay | Admitting: Family Medicine

## 2023-07-16 ENCOUNTER — Other Ambulatory Visit (INDEPENDENT_AMBULATORY_CARE_PROVIDER_SITE_OTHER): Payer: Medicare Other

## 2023-07-16 DIAGNOSIS — E538 Deficiency of other specified B group vitamins: Secondary | ICD-10-CM | POA: Diagnosis not present

## 2023-07-16 LAB — VITAMIN B12: Vitamin B-12: 589 pg/mL (ref 211–911)

## 2023-07-16 NOTE — Progress Notes (Signed)
 Better.  Continue monthly injections

## 2023-07-29 ENCOUNTER — Ambulatory Visit (INDEPENDENT_AMBULATORY_CARE_PROVIDER_SITE_OTHER): Payer: Medicare Other

## 2023-07-29 DIAGNOSIS — E538 Deficiency of other specified B group vitamins: Secondary | ICD-10-CM

## 2023-07-29 MED ORDER — CYANOCOBALAMIN 1000 MCG/ML IJ SOLN
1000.0000 ug | Freq: Once | INTRAMUSCULAR | Status: AC
  Administered 2023-07-29: 1000 ug via INTRAMUSCULAR

## 2023-07-29 NOTE — Progress Notes (Signed)
 Patient is in office today for a nurse visit for B12 Injection, per PCP's order. Patient Injection was given in the  Left deltoid. Patient tolerated injection well.

## 2023-07-31 ENCOUNTER — Encounter: Payer: Self-pay | Admitting: Internal Medicine

## 2023-07-31 ENCOUNTER — Ambulatory Visit (INDEPENDENT_AMBULATORY_CARE_PROVIDER_SITE_OTHER): Payer: Medicare Other | Admitting: Internal Medicine

## 2023-07-31 VITALS — BP 104/62 | HR 64 | Temp 97.3°F | Resp 16

## 2023-07-31 DIAGNOSIS — L508 Other urticaria: Secondary | ICD-10-CM | POA: Diagnosis not present

## 2023-07-31 DIAGNOSIS — K219 Gastro-esophageal reflux disease without esophagitis: Secondary | ICD-10-CM | POA: Insufficient documentation

## 2023-07-31 DIAGNOSIS — K581 Irritable bowel syndrome with constipation: Secondary | ICD-10-CM | POA: Insufficient documentation

## 2023-07-31 NOTE — Patient Instructions (Addendum)
Chronic Urticaria Patient reports intermittent itchiness, particularly around the abdomen. No full-blown hives. Currently managed with Xolair every two weeks, Claritin daily, Zyrtec daily, and Pepcid daily. Discussed the potential for reducing Xolair in the future, but not at this time due to ongoing symptoms. -Continue current regimen. -Reevaluate in 4-6 months, ideally with patient off antihistamines to assess for potential reduction in Xolair.  Gastroesophageal Reflux Disease (GERD) Managed with Pepcid daily. -Continue Pepcid daily. -continue follow-up with GI  Irritable Bowel Syndrome (IBS) with constipation Patient reports bloating and gas. Recently started on a FODMAP diet and will be tested for Small Intestinal Bacterial Overgrowth (SIBO). -Continue FODMAP diet. -Consider consultation with a nutritionist to ensure adequate caloric intake. -Continue with planned SIBO testing. -Continue follow-up with GI.  Follow up : 6 months It was a pleasure seeing you again in clinic today! Thank you for allowing me to participate in your care.  Tonny Bollman, MD [facility]

## 2023-07-31 NOTE — Progress Notes (Signed)
FOLLOW UP Date of Service/Encounter:  07/31/23  Subjective:  Chelsea Vega (DOB: [birth year]) is a 68 y.o. female who [facility] in re-evaluation of the following: chronic urticaria, abdominal pain History obtained from: chart review and patient.  For Review, LV was on 04/30/23  with Dr.Lynzie Cliburn seen for routine follow-up. See below for summary of history and diagnostics.   Therapeutic plans/changes recommended: continued on Xolair every 2 weeks ----------------------------------------------------- Pertinent History/Diagnostics:  Urticaria:  Started Oct 2023 a few weeks following viral illness, Associates itching, no other systemic symptoms. No triggers. No red flag symptoms, Lesions resolve in hours.  Therapies tried: triamcinolone cream, Nizoral lotion, zyrtec 1 tablets in AM zyrtec in the afternoon and benadryl 1-2 tablets in PM, ice packs. Pcitures of rash consistent with typical urticaria. - labs (09/11/22): negative CU index, negative alpha gal panel, normal CBCd, CMP, normal thyroid TSH, T4, elevated TPO, normal ESR, baseline tryptase 5.6 Diagnosed with Hashimoto thyroiditis based on labs. On selenium and LT4. Xolair started in May 2024 01/29/23-patient stopped topical estrogen and was started on levothyroxine by her endocrinologist for Hashimoto. Xolair increased to 300 mg every 2 weeks. --------------------------------------------------- Today presents for follow-up. Discussed the use of AI scribe software for clinical note transcription with the patient, who gave verbal consent to proceed.  History of Present Illness   The patient, with a history of chronic idiopathic urticaria, GERD, and thyroid disease, presents with concerns about reducing her Xolair dosage due to recent episodes of itchiness. She reports that the itchiness, initially localized to the back, has spread to the abdominal region. The patient has been managing the itchiness  with a daily regimen of Claritin, Zyrtec, and Pepcid, and biweekly Xolair injections. She has not experienced any full-blown hives recently.  She has been able to decrease her dose of antihistamine.  The patient also reports gastrointestinal issues, including chronic constipation, gas, and bloating, which have worsened recently. She has been following a gluten-free and dairy-free diet, but attempted to reintroduce these foods into her diet, which may have exacerbated her symptoms. She has been advised to follow a FODMAP diet and is scheduled for testing for Small Intestinal Bacterial Overgrowth (SIBO) due to her symptoms and family history.  The patient also mentions experiencing a couple of episodes of dizziness, which she is unsure if it is related to the new FODMAP diet or not. She has been trying to increase her protein intake as advised by a nutritionist, but is finding it challenging. She has also been taking supplements and has been provided with dietary advice and recipes by a nutritionist.  The patient is also on a low dose of medication for her thyroid condition, which she reports is being managed well.      All medications reviewed by clinical staff and updated in chart. No new pertinent medical or surgical history except as noted in HPI.  ROS: All others negative except as noted per HPI.   Objective:  BP 104/62   Pulse 64   Temp (!) 97.3 F (36.3 C) (Temporal)   Resp 16   SpO2 98%  There is no height or weight on file to calculate BMI. Physical Exam: General Appearance:  Alert, cooperative, no distress, appears stated age  Head:  Normocephalic, without obvious abnormality, atraumatic  Eyes:  Conjunctiva clear, EOM's intact  Ears EACs normal bilaterally and normal TMs bilaterally  Nose: Nares normal, hypertrophic turbinates, normal mucosa, and no visible anterior polyps  Throat:  Lips, tongue normal; teeth and gums normal, normal posterior oropharynx  Neck: Supple, symmetrical   Lungs:   clear to auscultation bilaterally, Respirations unlabored, no coughing  Heart:  regular rate and rhythm and no murmur, Appears well perfused  Extremities: No edema  Skin: Skin color, texture, turgor normal and no rashes or lesions on visualized portions of skin  Neurologic: No gross deficits   Labs:  Lab Orders  No laboratory test(s) ordered today   Assessment/Plan   Chronic Urticaria Patient reports intermittent itchiness, particularly around the abdomen. No full-blown hives. Currently managed with Xolair every two weeks, Claritin daily, Zyrtec daily, and Pepcid daily. Discussed the potential for reducing Xolair in the future, but not at this time due to ongoing symptoms. -Continue current regimen. -Reevaluate in 4-6 months, ideally with patient off antihistamines to assess for potential reduction in Xolair.  Gastroesophageal Reflux Disease (GERD) Managed with Pepcid daily. -Continue Pepcid daily. -continue follow-up with GI  Irritable Bowel Syndrome (IBS) with constipation Patient reports bloating and gas. Recently started on a FODMAP diet and will be tested for Small Intestinal Bacterial Overgrowth (SIBO). -Continue FODMAP diet. -Consider consultation with a nutritionist to ensure adequate caloric intake. -Continue with planned SIBO testing. -Continue follow-up with GI.  Follow up : 6 months  It was a pleasure seeing you again in clinic today! Thank you for allowing me to participate in your care.  Other: none  Tonny Bollman, MD  [facility]

## 2023-08-27 ENCOUNTER — Ambulatory Visit: Payer: Medicare Other

## 2023-08-27 DIAGNOSIS — E538 Deficiency of other specified B group vitamins: Secondary | ICD-10-CM | POA: Diagnosis not present

## 2023-08-27 MED ORDER — CYANOCOBALAMIN 1000 MCG/ML IJ SOLN
1000.0000 ug | Freq: Once | INTRAMUSCULAR | Status: AC
  Administered 2023-08-27: 1000 ug via INTRAMUSCULAR

## 2023-08-27 NOTE — Progress Notes (Signed)
Patient is in office today for a nurse visit for B12 Injection, per PCP's order. Patient Injection was given in the  Right deltoid. Patient tolerated injection well.

## 2023-09-24 ENCOUNTER — Ambulatory Visit (INDEPENDENT_AMBULATORY_CARE_PROVIDER_SITE_OTHER): Payer: Medicare Other

## 2023-09-24 DIAGNOSIS — E538 Deficiency of other specified B group vitamins: Secondary | ICD-10-CM | POA: Diagnosis not present

## 2023-09-24 MED ORDER — CYANOCOBALAMIN 1000 MCG/ML IJ SOLN
1000.0000 ug | Freq: Once | INTRAMUSCULAR | Status: AC
  Administered 2023-09-24: 1000 ug via INTRAMUSCULAR

## 2023-09-24 NOTE — Progress Notes (Signed)
 Patient was given a b12 injection in the left arm today. Patient tolerated injection well. Donzetta Starch, CMA

## 2023-10-27 ENCOUNTER — Ambulatory Visit (INDEPENDENT_AMBULATORY_CARE_PROVIDER_SITE_OTHER): Payer: Medicare Other | Admitting: *Deleted

## 2023-10-27 DIAGNOSIS — E538 Deficiency of other specified B group vitamins: Secondary | ICD-10-CM

## 2023-10-27 MED ORDER — CYANOCOBALAMIN 1000 MCG/ML IJ SOLN
1000.0000 ug | Freq: Once | INTRAMUSCULAR | Status: AC
  Administered 2023-10-27: 1000 ug via INTRAMUSCULAR

## 2023-10-27 NOTE — Progress Notes (Signed)
 Patient presents for B12 injection today. Patient received her B12 injection in Left Deltoid. Patient tolerated injection well.  Documentation entered in [facility] in [employer].

## 2023-11-03 ENCOUNTER — Other Ambulatory Visit: Payer: Self-pay | Admitting: Family Medicine

## 2023-11-03 DIAGNOSIS — F5101 Primary insomnia: Secondary | ICD-10-CM

## 2023-11-13 ENCOUNTER — Encounter: Payer: Self-pay | Admitting: Family Medicine

## 2023-11-19 ENCOUNTER — Encounter (HOSPITAL_COMMUNITY): Payer: Self-pay

## 2023-11-23 ENCOUNTER — Encounter: Payer: Self-pay | Admitting: Family Medicine

## 2023-11-23 ENCOUNTER — Ambulatory Visit (INDEPENDENT_AMBULATORY_CARE_PROVIDER_SITE_OTHER): Admitting: Family Medicine

## 2023-11-23 VITALS — BP 114/70 | HR 58 | Temp 97.2°F | Ht 65.0 in | Wt 122.8 lb

## 2023-11-23 DIAGNOSIS — F5101 Primary insomnia: Secondary | ICD-10-CM

## 2023-11-23 DIAGNOSIS — L501 Idiopathic urticaria: Secondary | ICD-10-CM | POA: Diagnosis not present

## 2023-11-23 DIAGNOSIS — F419 Anxiety disorder, unspecified: Secondary | ICD-10-CM

## 2023-11-23 DIAGNOSIS — E538 Deficiency of other specified B group vitamins: Secondary | ICD-10-CM | POA: Diagnosis not present

## 2023-11-23 DIAGNOSIS — F32A Depression, unspecified: Secondary | ICD-10-CM | POA: Insufficient documentation

## 2023-11-23 DIAGNOSIS — K219 Gastro-esophageal reflux disease without esophagitis: Secondary | ICD-10-CM | POA: Diagnosis not present

## 2023-11-23 MED ORDER — ESCITALOPRAM OXALATE 10 MG PO TABS: 10.0000 mg | ORAL_TABLET | Freq: Every day | ORAL | 0 refills | Status: DC

## 2023-11-23 MED ORDER — CYANOCOBALAMIN 1000 MCG/ML IJ SOLN
1000.0000 ug | Freq: Once | INTRAMUSCULAR | Status: AC
  Administered 2023-11-23: 1000 ug via INTRAMUSCULAR

## 2023-11-23 NOTE — Progress Notes (Signed)
 Subjective:     Patient ID: [name], female    DOB: Mar 03, 1956, 68 y.o.   MRN: [id number]  Chief Complaint  Patient presents with   Medication Refill    Pt in office for medication refill and also Vitamin B-12 injection for the month; pt admits to only taking 1/2 dose of 10 mg Lexapro, pt requesting to keep 10 mg dose incase ever needed to increase she already has on hand.    HPI Discussed the use of AI scribe software for clinical note transcription with the patient, who gave verbal consent to proceed.  History of Present Illness Chelsea Vega is a 68 year old female who presents for medication management and follow-up.  She has been tapering off antihistamines over the past two months in preparation for potentially tapering off Xolair . Currently, she takes one Claritin  daily.  She is on Lexapro 5 mg daily for depression and anxiety, which is effective. No thoughts of suicide.  She continues to use estrogen gel,  and Pepcid for GERD, which helps with heartburn. No food getting stuck or dark stools.  Her SIBO test was positive due to high methane levels. She completed a 10-day course of Xifaxin and neomycin, which alleviated gas and bloating. However, she still experiences constipation.  She is on thyroid  medication at a dose of 25 mcg. Followed by endocrine  She uses Ambien  as needed for sleep, approximately one to two times a week, and anticipates increased use during an upcoming [place] trip.  She reports ongoing bowel issues, including discomfort on the left side and constipation, described as 'sluggish bowels.' She has tried Metamucil, Miralax, Linzess, and Trulance with mixed results. Linzess increased gas and odor, while Trulance caused cramping. Seeing GI  B12 deficiency-getting monthly injections    There are no preventive care reminders to display for this patient.  Past Medical History:  Diagnosis Date   Abnormal vaginal Pap smear    DISTANT HX- S/P LEEP    Allergic rhinitis    Allergy young child   Hay Fever   Anemia    pregnancy   Anxiety Nov. 2023   Hives have caused it to worsen   Arthritis 2006   Left ankle- chronic pain   Asthma    childhood, early 20's   Constipation    Depression    Dysrhythmia    occassional PVC's   Gallstone    GERD (gastroesophageal reflux disease)    Hx of cystitis    RECURRENT, DR. TANNENBAUM   Hyperlipidemia Sept. 2023   No need to treat   Insomnia    Osteoporosis 10/2008   DR. GREWAL, DEXA 10/2008   Postmenopausal    PVC's (premature ventricular contractions)    HX OF   Slow heart rate    Thyroid  disease Nov. 2023   Elevated Thyroid  antibodies   Tinnitus    Trapezius muscle spasm    Vitamin D  deficiency     Past Surgical History:  Procedure Laterality Date   ANKLE SURGERY Right 05/2011   repair of tendon   ANTERIOR AND POSTERIOR REPAIR N/A 05/24/2015   Procedure: ANTERIOR (CYSTOCELE) AND POSTERIOR REPAIR (RECTOCELE);  Surgeon: Thurman Flores, MD;  Location: [facility];  Service: Gynecology;  Laterality: N/A;   BLEPHAROPLASTY  12/2021   BREAST CYST ASPIRATION     2005   COSMETIC SURGERY  June 2023   Blepharoplasty   DIAGNOSTIC LAPAROSCOPY  07/14/2010   removed tubes and ovaries   GANGLION CYST EXCISION Right  2005   right wrist   SEPTOPLASTY  2009   VAGINAL HYSTERECTOMY  05/24/2015   Procedure: HYSTERECTOMY VAGINAL;  Surgeon: Thurman Flores, MD;  Location: [facility];  Service: Gynecology;;  uterus and cervix only     Current Outpatient Medications:    Calcium Carbonate (CALTRATE 600 PO), Take 1 tablet by mouth daily.  Chewable tablet, Disp: , Rfl:    cyanocobalamin  (VITAMIN B12) 1000 MCG/ML injection, , Disp: , Rfl:    EPINEPHrine  0.3 mg/0.3 mL IJ SOAJ injection, Inject 0.3 mg into the muscle as needed for anaphylaxis., Disp: 1 each, Rfl: 1   Estradiol  0.52 MG/0.87 GM (0.06%) GEL, Place 1 application onto the skin daily. , Disp: , Rfl:    famotidine (PEPCID) 20 MG tablet, 20 mg 2 (two)  times daily., Disp: , Rfl:    fluticasone (FLONASE) 50 MCG/ACT nasal spray, Place 1 spray into both nostrils daily. , Disp: , Rfl:    ibuprofen  (ADVIL ,MOTRIN ) 600 MG tablet, Take 1 tablet (600 mg total) by mouth every 6 (six) hours as needed (mild pain)., Disp: 60 tablet, Rfl: 0   levothyroxine  (SYNTHROID ) 25 MCG tablet, Take 1 tablet (25 mcg total) by mouth daily., Disp: 90 tablet, Rfl: 3   loratadine  (CLARITIN ) 10 MG tablet, , Disp: , Rfl:    Magnesium Citrate 85 MG CHEW, , Disp: , Rfl:    omalizumab  (XOLAIR ) 150 MG/ML prefilled syringe, Inject 300 mg into the skin every 14 (fourteen) days., Disp: 4 mL, Rfl: 11   Omega-3 Fatty Acids (FISH OIL PO), Take 1 tablet by mouth daily., Disp: , Rfl:    escitalopram (LEXAPRO) 10 MG tablet, Take 1 tablet (10 mg total) by mouth daily., Disp: 90 tablet, Rfl: 0   triamcinolone cream (KENALOG) 0.5 %, Apply topically., Disp: , Rfl:    zolpidem  (AMBIEN ) 5 MG tablet, TAKE 0.5 TABLETS (2.5 MG TOTAL) BY MOUTH AT BEDTIME AS NEEDED., Disp: 30 tablet, Rfl: 0  Current Facility-Administered Medications:    omalizumab  (XOLAIR ) prefilled syringe 300 mg, 300 mg, Subcutaneous, Q28 days, Chelsea Binet, MD, 300 mg at 02/25/23 1408   omalizumab  (XOLAIR ) prefilled syringe 300 mg, 300 mg, Subcutaneous, Q14 Days, Chelsea Czar, MD, 300 mg at 03/25/23 1451  Allergies  Allergen Reactions   Flagyl [Metronidazole] Other (See Comments)    Dizzy   ROS neg/noncontributory except as noted HPI/below      Objective:      BP 114/70 (BP Location: Left Arm, Patient Position: Sitting, Cuff Size: Normal)   Pulse (!) 58   Temp (!) 97.2 F (36.2 C) (Temporal)   Ht 5\' 5"  (1.651 m)   Wt 122 lb 12.8 oz (55.7 kg)   SpO2 98%   BMI 20.43 kg/m  Wt Readings from Last 3 Encounters:  11/23/23 122 lb 12.8 oz (55.7 kg)  07/13/23 118 lb 8 oz (53.8 kg)  06/25/23 117 lb 6.4 oz (53.3 kg)    Physical Exam   Gen: WDWN NAD HEENT: NCAT, conjunctiva not injected, sclera  nonicteric NECK:  supple, no thyromegaly, no nodes, no carotid bruits CARDIAC: RRR, S1S2+, no murmur. DP 2+B LUNGS: CTAB. No wheezes ABDOMEN:  BS+, soft, NTND, No HSM, no masses EXT:  no edema MSK: no gross abnormalities.  NEURO: A&O x3.  CN II-XII intact.  PSYCH: normal mood. Good eye contact     Assessment & Plan:  Primary insomnia  Vitamin B12 deficiency -     Cyanocobalamin   Idiopathic urticaria  Gastroesophageal reflux disease without esophagitis  Anxiety  and depression  Other orders -     Escitalopram Oxalate; Take 1 tablet (10 mg total) by mouth daily.  Dispense: 90 tablet; Refill: 0  Assessment and Plan Assessment & Plan Constipation   Chronic constipation persists with sluggish bowels. Previous treatments with Metamucil, Miralax, Linzess, and Trulance had mixed results. She is currently exploring dietary modifications for SIBO-related constipation and plans to f/u w/GI specialist for further management.  Small intestinal bacterial overgrowth (SIBO)   SIBO with high methane levels was previously treated with neomycin and Xifaxin, which improved gas and bloating. Constipation remains an issue, and she is exploring dietary changes to manage symptoms.  Gastroesophageal reflux disease (GERD)   GERD symptoms are effectively managed with Pepcid, controlling heartburn. She reports no dysphagia or melena.  Depression and anxiety   Depression and anxiety are well-managed with Lexapro 5 mg daily. She has no suicidal ideation and prefers to maintain the current dosage but is open to increasing if necessary. Lexapro prescription will be sent to [facility] in [place].  Insomnia-chronic.  Controlled.  Taking ambien  2.5 mg prn nightly  Hypothyroidism   Hypothyroidism is managed with levothyroxine  25 mcg daily. She follows up with Dr. Janey Meek for thyroid  management.    Return for as sch in Oct.  Ellsworth Haas, MD

## 2023-11-23 NOTE — Patient Instructions (Signed)

## 2023-11-26 ENCOUNTER — Ambulatory Visit: Payer: Medicare Other

## 2023-12-22 ENCOUNTER — Ambulatory Visit: Payer: Medicare Other | Admitting: Internal Medicine

## 2023-12-23 ENCOUNTER — Other Ambulatory Visit: Payer: Medicare Other

## 2023-12-28 ENCOUNTER — Ambulatory Visit (INDEPENDENT_AMBULATORY_CARE_PROVIDER_SITE_OTHER): Payer: Medicare Other | Admitting: Internal Medicine

## 2023-12-28 ENCOUNTER — Encounter: Payer: Self-pay | Admitting: Internal Medicine

## 2023-12-28 VITALS — BP 102/64 | HR 55 | Ht 65.0 in | Wt 123.8 lb

## 2023-12-28 DIAGNOSIS — E673 Hypervitaminosis D: Secondary | ICD-10-CM

## 2023-12-28 DIAGNOSIS — L508 Other urticaria: Secondary | ICD-10-CM | POA: Diagnosis not present

## 2023-12-28 DIAGNOSIS — E063 Autoimmune thyroiditis: Secondary | ICD-10-CM

## 2023-12-28 LAB — T4, FREE: Free T4: 1.1 ng/dL (ref 0.8–1.8)

## 2023-12-28 LAB — TSH: TSH: 1.32 m[IU]/L (ref 0.40–4.50)

## 2023-12-28 LAB — VITAMIN D 25 HYDROXY (VIT D DEFICIENCY, FRACTURES): Vit D, 25-Hydroxy: 75 ng/mL (ref 30–100)

## 2023-12-28 NOTE — Progress Notes (Signed)
 Patient ID: Chelsea Vega, female   DOB: Aug 18, 1955, 68 y.o.   MRN: [id number]  HPI  Chelsea Vega is a 68 y.o.-year-old female, initially referred by her PCP, Dr. Wyn Heater, returning for follow-up for Hashimoto's thyroiditis and urticaria.  Interim history: Her pruritus has completely resolved.  She still has some dry skin. She continues to see allergology-on Xolair . She is tapering this to off. On claritin . Continues to have some fatigue. She is sleeping a little better though.  She just returned from a vacation in [place], [place], [place]. Also, constipation was worse - on Linzess. She saw [facility] >> dx'ed with SIBO.  Reviewed and addended history: Patient describes she initially had a URI in 05/2022.  She had cough and time but also developed itching and a rash in an 10 cm area in her lower back.  Cough was treated with a Z-Pak and the rash with triamcinolone cream and they both improved.  However, the rash then reappeared in the same spot and in 07/2022 it started to spread to legs, arms, breast, stomach and then worsened in 08/2022, when it included her scalp.  Recently, he started to have falls.  She is scratching excessively at night and has to sleep with ice packs.  During the day, the itching is better, but it worsens towards the evening, especially if she is more active.  She actually has bruises on her body due to scratching.  Patient also describes that she has anxiety, gets hot, and has nausea when the itching is worse.  Saw allergist, who obtained a normal tryptase and a normal alpha-gal test.  LFTs were normal.  Hemoglobin was also normal.  She did not have elevated eosinophiles.  Other possible causes for chronic urticaria were investigated with negative results (chronic urticaria index <2.9).  She is currently on Zyrtec and Pepcid and as needed steroid cream.  During this investigation, she has been dx'ed with Hashimoto's thyroiditis hypothyroidism in 08/2022 by elevated TPO  antibodies; she is not on Levothyroxine  as her thyroid  function tests are normal.  She sees Dr. Serge Dancer (OB/GYN) and she suggested a referral to endocrinology.  Patient has idiopathic urticaria and her TFTs and thyroid  antibodies were checked during investigation for this condition.  She continues to have hives despite taking antihistamines.  As mentioned above, a tryptase level was normal.  10/2022: Started selenium in an effort to decrease her TPO antibodies. At that time, we also decrease vitamin D  supplement as her level was elevated.  01/2023: Started LT4 low-dose 25 mcg daily to see if this would help improve pruritus At that time, also stopped her estrogen gel (was on this for many years).  End of 01/2023: Rash and itching resolved  02/2023: Restarted estrogen general diet without return of pruritus.  She is currently on: - Selenium 200 mcg daily - started 10/2022 - Levothyroxine  25 mcg daily - started 01/2023  She takes this: - in am - fasting - at least 30 min from b'fast - + calcium in the evening - no iron - no multivitamins - no PPIs, + Pepcid 4h later - not on Biotin  I reviewed pt's thyroid  tests: Lab Results  Component Value Date   TSH 2.14 06/16/2023   TSH 1.90 03/20/2023   TSH 2.21 01/12/2023   TSH 1.56 10/29/2022   TSH 2.410 09/11/2022   TSH 1.68 08/11/2006   FREET4 0.8 06/16/2023   FREET4 0.81 03/20/2023   FREET4 0.65 01/12/2023   FREET4 0.75 10/29/2022  Component     Latest Ref Rng 10/29/2022  Thyroglobulin Ab     < or = 1 IU/mL 1   Thyroperoxidase Ab SerPl-aCnc     <9 IU/mL 22 (H)     Component     Latest Ref Rng 09/11/2022  Thyroperoxidase Ab SerPl-aCnc     0 - 34 IU/mL 43 (H)     Pt denies feeling nodules in neck, hoarseness, dysphagia/odynophagia. A neck U/S (03/03/2019) was normal.  She has + FH of thyroid  disorders in: sister - Graves ds.. No FH of thyroid  cancer. + FH of autoimmune ds. - Raynaud's phenomenon. No h/o radiation tx to  head or neck. No recent use of iodine supplements.   High vitamin D :  Before our visit in 10/2022, patient was taking 10,000 units vitamin D  daily. Vitamin D  level was elevated so I advised her to stop the supplement.   In 01/2023, we  restarted 2000 units daily.  Reviewed Vitamin D  levels: Lab Results  Component Value Date   VD25OH 67 06/16/2023   VD25OH 57.05 01/06/2023   VD25OH 106.00 (HH) 10/29/2022   Pt. 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 She is currently off her estrogen and other supplements to see if pruritus improves. She has B12 deficiency and is on IM B12. She had an episode of right upper quadrant pain summer 2024 after eating greasy food >> RUQ U/S showed cholelithiasis without cholecystitis.  She was advised that if she developed further episodes, she might need to have a HIDA scan.  She does have IBS + constipation and is tested for SIBO. In 2024, she had CP and was evaluated by cardiology with an EKG and heart CT >> CAC 0. This was considered to be related GERD  ROS: + See HPI  PMH: Past Medical History:  Diagnosis Date   Abnormal vaginal Pap smear    DISTANT HX- S/P LEEP   Allergic rhinitis    Allergy young child   Hay Fever   Anemia    pregnancy   Anxiety Nov. 2023   Hives have caused it to worsen   Arthritis 2006   Left ankle- chronic pain   Asthma    childhood, early 20's   Constipation    Depression    Dysrhythmia    occassional PVC's   Gallstone    GERD (gastroesophageal reflux disease)    Hx of cystitis    RECURRENT, DR. TANNENBAUM   Hyperlipidemia Sept. 2023   No need to treat   Insomnia    Osteoporosis 10/2008   DR. GREWAL, DEXA 10/2008   Postmenopausal    PVC's (premature ventricular contractions)    HX OF   Slow heart rate    Thyroid  disease Nov. 2023   Elevated Thyroid  antibodies   Tinnitus     Trapezius muscle spasm    Vitamin D  deficiency    Past Surgical History:  Procedure Laterality Date   ANKLE SURGERY Right 05/2011   repair of tendon   ANTERIOR AND POSTERIOR REPAIR N/A 05/24/2015   Procedure: ANTERIOR (CYSTOCELE) AND POSTERIOR REPAIR (RECTOCELE);  Surgeon: Thurman Flores, MD;  Location: [facility];  Service: Gynecology;  Laterality: N/A;   BLEPHAROPLASTY  12/2021   BREAST CYST ASPIRATION     2005   COSMETIC SURGERY  June 2023   Blepharoplasty   DIAGNOSTIC LAPAROSCOPY  07/14/2010   removed tubes and ovaries   GANGLION CYST EXCISION  Right 2005   right wrist   SEPTOPLASTY  2009   VAGINAL HYSTERECTOMY  05/24/2015   Procedure: HYSTERECTOMY VAGINAL;  Surgeon: Thurman Flores, MD;  Location: [facility];  Service: Gynecology;;  uterus and cervix only   Social History   Socioeconomic History   Marital status: Married    Spouse name: Not on file   Number of children: 3   Years of education: Not on file   Highest education level: Bachelor's degree (e.g., BA, AB, BS)  Occupational History   Occupation: NURSE   Occupation: Charity fundraiser    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 at age [age].  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   Drug use: No   Sexual activity: Not Currently    Birth control/protection: Surgical  Other Topics Concern   Not on file  Social History Narrative   2 grands   Social Drivers of Health   Financial Resource Strain: Low Risk  (11/22/2023)   Overall Financial Resource Strain (CARDIA)    Difficulty of Paying Living Expenses: Not hard at all  Food Insecurity: No Food Insecurity  (11/22/2023)   Hunger Vital Sign    Worried About Running Out of Food in the Last Year: Never true    Ran Out of Food in the Last Year: Never true  Transportation Needs: No Transportation Needs (11/22/2023)   PRAPARE - Administrator, Civil Service (Medical): No    Lack of Transportation (Non-Medical): No  Physical Activity: Sufficiently Active (11/22/2023)   Exercise Vital Sign    Days of Exercise per Week: 5 days    Minutes of Exercise per Session: 40 min  Stress: Stress Concern Present (11/22/2023)   [employer] of Occupational Health - Occupational Stress Questionnaire    Feeling of Stress : To some extent  Social Connections: Socially Integrated (11/22/2023)   Social Connection and Isolation Panel    Frequency of Communication with Friends and Family: More than three times a week    Frequency of Social Gatherings with Friends and Family: Twice a week    Attends Religious Services: 1 to 4 times per year    Active Member of [employer] or Organizations: Yes    Attends Banker Meetings: 1 to 4 times per year    Marital Status: Married  Catering manager Violence: Not on file   Current Outpatient Medications on File Prior to Visit  Medication Sig Dispense Refill   Calcium Carbonate (CALTRATE 600 PO) Take 1 tablet by mouth daily.  Chewable tablet     cyanocobalamin  (VITAMIN B12) 1000 MCG/ML injection      EPINEPHrine  0.3 mg/0.3 mL IJ SOAJ injection Inject 0.3 mg into the muscle as needed for anaphylaxis. 1 each 1   escitalopram  (LEXAPRO ) 10 MG tablet Take 1 tablet (10 mg total) by mouth daily. 90 tablet 0   Estradiol  0.52 MG/0.87 GM (0.06%) GEL Place 1 application onto the skin daily.      famotidine (PEPCID) 20 MG tablet 20 mg 2 (two) times daily.  fluticasone (FLONASE) 50 MCG/ACT nasal spray Place 1 spray into both nostrils daily.      ibuprofen  (ADVIL ,MOTRIN ) 600 MG tablet Take 1 tablet (600 mg total) by mouth every 6 (six) hours as needed (mild pain). 60  tablet 0   levothyroxine  (SYNTHROID ) 25 MCG tablet Take 1 tablet (25 mcg total) by mouth daily. 90 tablet 3   loratadine  (CLARITIN ) 10 MG tablet      Magnesium Citrate 85 MG CHEW      omalizumab  (XOLAIR ) 150 MG/ML prefilled syringe Inject 300 mg into the skin every 14 (fourteen) days. 4 mL 11   Omega-3 Fatty Acids (FISH OIL PO) Take 1 tablet by mouth daily.     triamcinolone cream (KENALOG) 0.5 % Apply topically.     zolpidem  (AMBIEN ) 5 MG tablet TAKE 0.5 TABLETS (2.5 MG TOTAL) BY MOUTH AT BEDTIME AS NEEDED. 30 tablet 0   Current Facility-Administered Medications on File Prior to Visit  Medication Dose Route Frequency Provider Last Rate Last Admin   omalizumab  (XOLAIR ) prefilled syringe 300 mg  300 mg Subcutaneous Q28 days Orelia Binet, MD   300 mg at 02/25/23 1408   omalizumab  (XOLAIR ) prefilled syringe 300 mg  300 mg Subcutaneous Q14 Days Sean Czar, MD   300 mg at 03/25/23 1451   Allergies  Allergen Reactions   Flagyl [Metronidazole] Other (See Comments)    Dizzy   Family History  Problem Relation Age of Onset   Cancer Mother 36       ovarian   Hyperlipidemia Mother    Anxiety disorder Mother    Asthma Mother    COPD Mother    Depression Mother    Hearing loss Mother    Miscarriages / [place] Mother    Cancer Father 72       prostate   Alcohol abuse Father    Arthritis Father    Vision loss Father    Varicose Veins Father    Arthritis Sister    Hyperlipidemia Sister    Hypertension Sister    Graves' disease Sister    Depression Sister    Hypertension Sister    Cancer Brother 57       prostate   Depression Brother    Asthma Child    Asthma Child    PE: BP 102/64   Pulse (!) 55   Ht 5' 5 (1.651 m)   Wt 123 lb 12.8 oz (56.2 kg)   SpO2 97%   BMI 20.60 kg/m  Wt Readings from Last 3 Encounters:  12/28/23 123 lb 12.8 oz (56.2 kg)  11/23/23 122 lb 12.8 oz (55.7 kg)  07/13/23 118 lb 8 oz (53.8 kg)   Constitutional: normal weight, in NAD,  tanned Eyes:  EOMI, no exophthalmos ENT: no neck masses, no cervical lymphadenopathy Cardiovascular: RRR, No MRG Respiratory: CTA B Musculoskeletal: no deformities Skin: No rashes Neurological: no tremor with outstretched hands  ASSESSMENT: 1. Hashimoto thyroiditis  2. Chronic urticaria  3. High vitamin D   PLAN: 1. Hashimoto thyroiditis -Currently on selenium and we also started LT4 to see if this could help with her urticaria - latest thyroid  labs reviewed with pt. >> normal: Lab Results  Component Value Date   TSH 2.14 06/16/2023  - she continues on LT4 25 mcg daily - pt feels good on this dose. - we discussed about taking the thyroid  hormone every day, with water, >30 minutes before breakfast, separated by >4 hours from acid reflux medications, calcium, iron, multivitamins. Pt. is taking  it correctly. - will check thyroid  tests today: TSH and fT4.  At next visit, I plan to repeat her TPO and ATA antibodies and see if we can stop selenium. - If labs are abnormal, she will need to return for repeat TFTs in 1.5 months - OTW, I will see her back in a year  2.  Chronic urticaria - For ~1-1.5 years -When I first saw the patient, she had significant impairment in her quality of life due to itching which included the scalp, polyps, and extended to the entire body.  It was worse at night and she was not able to control scratching.  She also had bruises from this.  She has decreased appetite and weight loss. - We ended up starting low-dose levothyroxine  for her diagnosis of Hashimoto's thyroiditis.  Her pruritus improved significantly afterwards and it is now resolved.  She continues to see allergology. On Xolair .  Reviewed previous workup: -Systemic mastocytosis was ruled out with a normal tryptase -Cholestasis was ruled out with normal LFTs -Alpha-gal allergy was ruled out with a normal alpha gal panel -Anemia was ruled out with a normal hemoglobin -A parasitic infection was ruled  out by normal eosinophils -She also had a normal chronic urticaria panel -She did not feel that any medications that she is taking could be causing this.  She takes occasional Ambien  low-dose, but symptoms are not correlating with this. -We started selenium at last visit to lower her antibody titer -this did not help much -Her vitamin D  level was elevated so we stopped her supplement dose and latest level normalized.  She restarted a lower dose of vitamin D , 2000 units daily.  Of note, she did not have high calcium levels. -In 01/2023, we started LT4 low dose to see if this can help.  Around the same time, she stopped her estrogen gel.  She ended up restarting estrogen since then, without return of pruritus.  3. High vitamin D  level - She had an instance with a very elevated vitamin D , higher than 100, after which we stopped her vitamin D  supplement - We then restarted 2000 units daily - Will recheck a vitamin D  level today  Orders Placed This Encounter  Procedures   T4, free   TSH   VITAMIN D  25 Hydroxy (Vit-D Deficiency, Fractures)   Emilie Harden, MD PhD [facility] Endocrinology

## 2023-12-28 NOTE — Patient Instructions (Signed)
 Please continue levothyroxine  25 mcg daily.  Take the thyroid  hormone every day, with water, at least 30 minutes before breakfast, separated by at least 4 hours from: - acid reflux medications - calcium - iron - multivitamins  Continue vitamin D  2000 units daily.  Please stop at the lab.  Please return in 1 year.

## 2023-12-29 ENCOUNTER — Encounter: Payer: Medicare Other | Attending: Family Medicine | Admitting: Dietician

## 2023-12-29 ENCOUNTER — Encounter: Payer: Self-pay | Admitting: Dietician

## 2023-12-29 ENCOUNTER — Ambulatory Visit: Payer: Self-pay | Admitting: Internal Medicine

## 2023-12-29 VITALS — Ht 65.0 in | Wt 122.8 lb

## 2023-12-29 DIAGNOSIS — E063 Autoimmune thyroiditis: Secondary | ICD-10-CM | POA: Insufficient documentation

## 2023-12-29 NOTE — Progress Notes (Signed)
 Medical Nutrition Therapy Employee Wellness Visit  Appointment Start time:  [phone]  Appointment End time: [phone]  Referral diagnosis: Hashimoto's Thyroiditis (E06.3) Preferred learning style: no preference indicated (auditory, visual, hands on, no preference indicated) Learning readiness: ready (not ready, contemplating, ready, change in progress)  NUTRITION ASSESSMENT  Anthropometrics  Weight: 118.5 lb Height: 65 in  Body Composition Scale 06/25/2023 07/13/2023 12/29/2023  Current Body Weight 117.4 118.5 122.8  Total Body Fat % 22.6 23.0 25.1  Visceral Fat 3 4 4   Fat-Free Mass % 77.3 76.9 74.8   Total Body Water % 53.1 52.9 51.9  Muscle-Mass lbs 27.8 27.9 27.7  BMI 19.2 19.3 20.1  Body Fat Displacement            Torso  lbs 16.2 16.7 18.9         Left Leg  lbs 3.2 3.3 3.7         Right Leg  lbs 3.2 3.3 3.7         Left Arm  lbs 1.6 1.6 1.8         Right Arm  lbs 1.6 1.6 1.8   Clinical Medical Hx: GERD, Hashimoto, Hives Medications: zolair (once a month) injection every two weeks, 300 mg), Pepcid twice a day, hormone therapy, zyrtec, linzess  Labs: LDL 112 Notable Signs/Symptoms: none noted  Lifestyle & Dietary Hx  Pt states she just returned from several weeks in [place] and [place]. Pt states she has been on two antibiotics for SIBO (Small Intestine, Bacterial Overgrowth), stating her symptoms got better. Pt states she learned about a fermentation diet to help with gut health. Pt states her GI doctor started her on Linzess for constipation. Pt states she wants to get into some classes to help her to be consistent. [facility] stating [facility] has [facility] program renew active Pt states she is staying on the low dose thyroid  medication and going back in a year. Tapering off Zolare for chronic hives, stating she is doing well and not having flare ups. Pt states she thought she was going to have a flare up, stating it didn't happen.  Estimated daily fluid intake: 48  oz Supplements: bone builder chewable, magnesium citrate, Vit D + K, SPM Active, beef gelatin powder,There-Biotic, GI Enzyme blend with betaine HCL, B12 shots once a month Sleep: sleeps okay, has Ambien  if needed, stating she might take it once every two weeks. Stress / self-care: 5-6 on a scale of 1-10; walking Current average weekly physical activity: walk 2-3 miles a day, daily; dumbbells some  24-Hr Dietary Recall First Meal: coconut yogurt, fruit (berries), granola or protein smoothie with fruit Snack:  Second Meal: oatmeal protein bar or carrots or crackers (nut thin or rice crackers) with cheese or mixed nuts or soup or left overs Snack:  Third Meal: protein, vegetable, rice Snack: grain free tortilla chips or pumpkin seeds or pecans Beverages: water, coffee, seltzer water with cranberry juice, wine (1-2 glasses 4 days a week)  NUTRITION DIAGNOSIS  NB-1.1 Food and nutrition-related knowledge deficit As related to calorie and protein intake.  As evidenced by food recall and recent weight loss.  NUTRITION INTERVENTION  Nutrition education (E-1) on the following topics:  Protein is an important food group to include in your diet. Your body needs protein to build bones, muscles, cartilage, and skin. It also needs protein to repair cells and tissues, make and regulate hormones, supply oxygen to blood and other key areas, and aid  in digestion. A protein is formed out of 20 different types of amino acids (organic compounds) all connected to each other. Our body makes 11 types of amino acids on its own. But we need to get the remaining nine types of amino acids, called essential amino acids through other sources, such as the food we eat. Encouraged pt to continue to drink a minium 64 fluid ounces with half being plain water to satisfy proper hydration   Resistance exercise is crucial for everyone because it offers a wide range of benefits for both physical and mental well-being across all age  groups. Increased muscle mass: Resistance training helps maintain and build muscle, which naturally declines with age. This improved muscle mass supports better overall physical function. Stronger bones: By putting stress on your bones, resistance exercise increases bone density, reducing the risk of osteoporosis and fractures. Improved mobility and balance: Strength training enhances joint flexibility, reduces arthritis symptoms, and improves balance, significantly decreasing the risk of falls and injuries, particularly in older adults. Enhanced metabolism and weight management: Muscle tissue burns calories more efficiently than fat, so building muscle through resistance training can help you manage your weight and increase your metabolism. Reduced risk of chronic conditions: Resistance training can help prevent or manage conditions like arthritis, back pain, obesity, heart disease, and diabetes. Improved mental health: Regular resistance training can boost your mood, reduce symptoms of anxiety and depression, and increase self-esteem.   Handouts Provided Include  Body Comp Scale results  Learning Style & Readiness for Change Teaching method utilized: Visual & Auditory  Demonstrated degree of understanding via: Teach Back  Barriers to learning/adherence to lifestyle change: nothing identified  Goals Established by Pt Re-engage: add resistance training to physical activity: aim 2-3 days per week Continue: track protein; aim for 60 grams above the collagen supplements Continue: aim for 64 ounces of hydrating fluids Continue: focus on what you can eat and not what you can't, due to allergies and avoid weight loss.  MONITORING & EVALUATION Dietary intake, weekly physical activity.  Next Steps  Patient is to return in 3 months for follow-up.

## 2023-12-30 ENCOUNTER — Encounter: Payer: Self-pay | Admitting: Internal Medicine

## 2023-12-30 ENCOUNTER — Ambulatory Visit (INDEPENDENT_AMBULATORY_CARE_PROVIDER_SITE_OTHER): Payer: Medicare Other | Admitting: Internal Medicine

## 2023-12-30 ENCOUNTER — Ambulatory Visit (INDEPENDENT_AMBULATORY_CARE_PROVIDER_SITE_OTHER)

## 2023-12-30 VITALS — BP 102/64 | HR 55 | Temp 98.2°F | Resp 16

## 2023-12-30 DIAGNOSIS — L508 Other urticaria: Secondary | ICD-10-CM

## 2023-12-30 DIAGNOSIS — K581 Irritable bowel syndrome with constipation: Secondary | ICD-10-CM | POA: Diagnosis not present

## 2023-12-30 DIAGNOSIS — E538 Deficiency of other specified B group vitamins: Secondary | ICD-10-CM | POA: Diagnosis not present

## 2023-12-30 DIAGNOSIS — K219 Gastro-esophageal reflux disease without esophagitis: Secondary | ICD-10-CM | POA: Diagnosis not present

## 2023-12-30 MED ORDER — CYANOCOBALAMIN 1000 MCG/ML IJ SOLN
1000.0000 ug | Freq: Once | INTRAMUSCULAR | Status: AC
  Administered 2023-12-30: 1000 ug via INTRAMUSCULAR

## 2023-12-30 MED ORDER — EPINEPHRINE 0.3 MG/0.3ML IJ SOAJ: 0.3000 mg | INTRAMUSCULAR | 1 refills | Status: AC | PRN

## 2023-12-30 NOTE — Progress Notes (Signed)
.  Patient is in office today for a nurse visit for B12 Injection per [facility] Patient Injection was given in the  Right deltoid. Patient tolerated injection well.

## 2023-12-30 NOTE — Patient Instructions (Addendum)
 Chronic Urticaria Patient reports intermittent itchiness, particularly around the abdomen. No full-blown hives. Currently managed with Xolair  every two weeks, Claritin  daily, Zyrtec daily, and Pepcid daily. Discussed the potential for reducing Xolair  in the future, but not at this time due to ongoing symptoms. -Spacer Xolair  to every 4 weeks, and continue at this dosing. If you have a flare prior to 4 weeks, go back to either 2 or 3 week dosing (depending on when you had your flare). If you are doing well on every 4 weeks dosing in 3 months, please send us  a mychart so we can update your prescription.  -Reevaluate in 6 months, ideally with patient off antihistamines to assess for potential reduction in Xolair .  Gastroesophageal Reflux Disease (GERD) Managed with Pepcid daily. -Continue Pepcid daily. -continue follow-up with GI  Irritable Bowel Syndrome (IBS) with constipation and SIBO Patient reports bloating and gas. Recently started on a FODMAP diet  Interval Hx: diagnosed and treated for SIBO with improvement in symptoms including hives. -Continue follow-up with GI.  Follow up : 6 months, sooner if needed. It was a pleasure seeing you again in clinic today! Thank you for allowing me to participate in your care.  Jonathon Neighbors, MD [facility]

## 2023-12-30 NOTE — Progress Notes (Signed)
 FOLLOW UP Date of Service/Encounter:   12/30/2023  Subjective:  Chelsea Vega (DOB: 04/16/1956) is a 68 y.o. female who [facility] in re-evaluation of the following: chronic urticaria History obtained from: chart review and patient.  For Review, LV was on 07/31/23  with Dr.Yazaira Speas seen for routine follow-up. See below for summary of history and diagnostics.   Therapeutic plans/changes recommended: doing well on xolair  every 2 weeks, still having some breakthrough symptoms. ----------------------------------------------------- Pertinent History/Diagnostics:  Urticaria:  Started Oct 2023 a few weeks following viral illness, Associates itching, no other systemic symptoms. No triggers. No red flag symptoms, Lesions resolve in hours.  Therapies tried: triamcinolone cream, Nizoral lotion, zyrtec 1 tablets in AM zyrtec in the afternoon and benadryl  1-2 tablets in PM, ice packs. Pcitures of rash consistent with typical urticaria. - labs (09/11/22): negative CU index, negative alpha gal panel, normal CBCd, CMP, normal thyroid  TSH, T4, elevated TPO, normal ESR, baseline tryptase 5.6 Diagnosed with Hashimoto thyroiditis based on labs. On selenium and LT4. Xolair  started in May 2024 01/29/23-patient stopped topical estrogen and was started on levothyroxine  by her endocrinologist for Hashimoto. Xolair  increased to 300 mg every 2 weeks. --------------------------------------------------- Today presents for follow-up. Discussed the use of AI scribe software for clinical note transcription with the patient, who gave verbal consent to proceed.  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  She has been receiving Xolair  injections every two weeks for her autoimmune hives, which are administered at home, primarily in her abdomen. An attempt to inject in her leg resulted in a  hematoma, likely due to low body fat. She experiences anxiety as the two-week mark approaches, fearing a flare-up, but has not had any significant breakouts.  She is also taking Claritin  and Pepcid for reflux issues, which she has been on for approximately three months. No issues or breakthrough symptoms with this medication regimen are reported.  She was recently diagnosed with small intestinal bacterial overgrowth (SIBO) after a discrepancy in test interpretation between two doctors. Her GI doctor confirmed the diagnosis based on high levels of certain markers. She was treated for SIBO and noted improvement in her condition post-treatment.  Her thyroid  levels have been monitored, and she is currently on a low dose of thyroid  medication (25 mcg), which is managing her condition well.       All medications reviewed by clinical staff and updated in chart. No new pertinent medical or surgical history except as noted in HPI.  ROS: All others negative except as noted per HPI.   Objective:  BP 102/64   Pulse (!) 55   Temp 98.2 F (36.8 C) (Temporal)   Resp 16   SpO2 98%  There is no height or weight on file to calculate BMI. Physical Exam: General Appearance:  Alert, cooperative, no distress, appears stated age  Head:  Normocephalic, without obvious abnormality, atraumatic  Eyes:  Conjunctiva clear, EOM's intact  Nose: Nares normal, no rhinorrhea  Throat: Lips, tongue normal; teeth and gums normal, moist mucus membranes  Neck: Supple, symmetrical  Lungs:   clear to auscultation bilaterally, Respirations unlabored, no coughing  Heart:  regular rate and rhythm and no murmur, Appears well perfused  Extremities: No edema  Skin: Skin color, texture, turgor normal and no rashes or lesions on visualized portions of skin  Neurologic: No gross deficits  Labs:  Lab Orders  No laboratory test(s) ordered today    Assessment/Plan   Chronic Urticaria Patient reports intermittent itchiness,  particularly around the abdomen. No full-blown hives. Currently managed with Xolair  every two weeks, Claritin  daily, Zyrtec daily, and Pepcid daily. Discussed the potential for reducing Xolair  in the future, but not at this time due to ongoing symptoms. -Spacer Xolair  to every 4 weeks, and continue at this dosing. If you have a flare prior to 4 weeks, go back to either 2 or 3 week dosing (depending on when you had your flare). If you are doing well on every 4 weeks dosing in 3 months, please send us  a mychart so we can update your prescription.  -Reevaluate in 6 months, ideally with patient off antihistamines to assess for potential reduction in Xolair .  Gastroesophageal Reflux Disease (GERD) Managed with Pepcid daily. -Continue Pepcid daily. -continue follow-up with GI  Irritable Bowel Syndrome (IBS) with constipation and SIBO Patient reports bloating and gas. Recently started on a FODMAP diet  Interval Hx: diagnosed and treated for SIBO with improvement in symptoms including hives. -Continue follow-up with GI.  Follow up : 6 months, sooner if needed. It was a pleasure seeing you again in clinic today! Thank you for allowing me to participate in your care.  Other: none  Jonathon Neighbors, MD  [facility] 

## 2024-01-09 ENCOUNTER — Other Ambulatory Visit: Payer: Self-pay | Admitting: Internal Medicine

## 2024-01-11 MED ORDER — LEVOTHYROXINE SODIUM 25 MCG PO TABS: 25.0000 ug | ORAL_TABLET | Freq: Every day | ORAL | 3 refills | Status: AC

## 2024-01-21 ENCOUNTER — Other Ambulatory Visit: Payer: Self-pay | Admitting: Internal Medicine

## 2024-01-27 ENCOUNTER — Ambulatory Visit (INDEPENDENT_AMBULATORY_CARE_PROVIDER_SITE_OTHER)

## 2024-01-27 DIAGNOSIS — E538 Deficiency of other specified B group vitamins: Secondary | ICD-10-CM | POA: Diagnosis not present

## 2024-01-27 MED ORDER — CYANOCOBALAMIN 1000 MCG/ML IJ SOLN
1000.0000 ug | Freq: Once | INTRAMUSCULAR | Status: AC
  Administered 2024-01-27: 1000 ug via INTRAMUSCULAR

## 2024-01-27 NOTE — Progress Notes (Signed)
 Done

## 2024-01-27 NOTE — Progress Notes (Addendum)
 Patient is in office today for a nurse visit for B12 Injection. Patient Injection was given in the  Left deltoid. Patient tolerated injection well. Next appt scheduled for 03/15/2024. No further questions at this time.

## 2024-02-18 ENCOUNTER — Other Ambulatory Visit: Payer: Self-pay | Admitting: Family Medicine

## 2024-03-13 ENCOUNTER — Encounter: Payer: Self-pay | Admitting: Internal Medicine

## 2024-03-15 ENCOUNTER — Ambulatory Visit (INDEPENDENT_AMBULATORY_CARE_PROVIDER_SITE_OTHER): Admitting: *Deleted

## 2024-03-15 DIAGNOSIS — E538 Deficiency of other specified B group vitamins: Secondary | ICD-10-CM | POA: Diagnosis not present

## 2024-03-15 MED ORDER — CYANOCOBALAMIN 1000 MCG/ML IJ SOLN
1000.0000 ug | Freq: Once | INTRAMUSCULAR | Status: AC
  Administered 2024-03-15: 1000 ug via INTRAMUSCULAR

## 2024-03-15 NOTE — Progress Notes (Signed)
 Per orders of Dr. Ruthine Dose, injection of B 12 given in left deltoid per patient preference by Jobe Gibbon, CMA. Patient tolerated injection well. Patient reminded to schedule next injection.

## 2024-03-16 NOTE — Telephone Encounter (Signed)
 Hi Ms. Stegemann,  Yes, I would continue every 4 weeks until our follow-up. If doing well at that visit, we will try to wean off completely.   Sincerely, Rocky Endow, MD [facility]

## 2024-04-04 ENCOUNTER — Ambulatory Visit (INDEPENDENT_AMBULATORY_CARE_PROVIDER_SITE_OTHER)

## 2024-04-04 VITALS — Ht 65.0 in | Wt 122.0 lb

## 2024-04-04 DIAGNOSIS — Z0000 Encounter for general adult medical examination without abnormal findings: Secondary | ICD-10-CM

## 2024-04-04 NOTE — Progress Notes (Signed)
 Subjective:   Chelsea Vega is a 68 y.o. who presents for a Medicare Wellness preventive visit.  As a reminder, Annual Wellness Visits don't include a physical exam, and some assessments may be limited, especially if this visit is performed virtually. We may recommend an in-person follow-up visit with your provider if needed.  Visit Complete: Virtual I connected with  Chelsea Vega on 04/04/24 by a audio enabled telemedicine application and verified that I am speaking with the correct person using two identifiers.  Patient Location: Home  Provider Location: Office/Clinic  I discussed the limitations of evaluation and management by telemedicine. The patient expressed understanding and agreed to proceed.  Vital Signs: Because this visit was a virtual/telehealth visit, some criteria may be missing or patient reported. Any vitals not documented were not able to be obtained and vitals that have been documented are patient reported.  VideoDeclined- This patient declined Librarian, academic. Therefore the visit was completed with audio only.  Persons Participating in Visit: Patient.  AWV Questionnaire: Yes: Patient Medicare AWV questionnaire was completed by the patient on 04/01/24; I have confirmed that all information answered by patient is correct and no changes since this date.  Cardiac Risk Factors include: advanced age (>3men, >81 women)     Objective:    Today's Vitals   04/04/24 1537  Weight: 122 lb (55.3 kg)  Height: 5' 5 (1.651 m)   Body mass index is 20.3 kg/m.     04/04/2024    3:41 PM 11/04/2022    1:19 PM 09/19/2021   10:21 AM 06/08/2017    5:14 PM 04/23/2016   11:50 AM 05/24/2015    1:00 PM 05/18/2015    4:00 PM  Advanced Directives  Does Patient Have a Medical Advance Directive? Yes Yes Yes Yes  No  Yes  Yes   Type of Estate agent of [place];Living will Healthcare Power of [place];Living will Healthcare Power of  [place];Living will Healthcare Power of [place];Living will  Healthcare Power of [place];Living will  Living will   Does patient want to make changes to medical advance directive?   No - Patient declined No - Patient declined   No - Patient declined  No - Patient declined   Copy of Healthcare Power of Attorney in Chart? No - copy requested No - copy requested No - copy requested No - copy requested   No - copy requested  No - copy requested   Would patient like information on creating a medical advance directive?     No - patient declined information        Data saved with a previous flowsheet row definition    Current Medications (verified) Outpatient Encounter Medications as of 04/04/2024  Medication Sig   Calcium Carbonate (CALTRATE 600 PO) Take 1 tablet by mouth daily.  Chewable tablet   cyanocobalamin  (VITAMIN B12) 1000 MCG/ML injection    EPINEPHrine  0.3 mg/0.3 mL IJ SOAJ injection Inject 0.3 mg into the muscle as needed for anaphylaxis.   escitalopram  (LEXAPRO ) 10 MG tablet TAKE 1 TABLET BY MOUTH EVERY DAY   Estradiol  0.52 MG/0.87 GM (0.06%) GEL Place 1 application onto the skin daily.    famotidine (PEPCID) 20 MG tablet 20 mg 2 (two) times daily.   fluticasone (FLONASE) 50 MCG/ACT nasal spray Place 1 spray into both nostrils daily.    ibuprofen  (ADVIL ,MOTRIN ) 600 MG tablet Take 1 tablet (600 mg total) by mouth every 6 (six) hours as needed (mild pain).  levothyroxine  (SYNTHROID ) 25 MCG tablet Take 1 tablet (25 mcg total) by mouth daily.   LINZESS 72 MCG capsule    loratadine  (CLARITIN ) 10 MG tablet    Magnesium Citrate 85 MG CHEW    triamcinolone cream (KENALOG) 0.5 % Apply topically.   zolpidem  (AMBIEN ) 5 MG tablet TAKE 0.5 TABLETS (2.5 MG TOTAL) BY MOUTH AT BEDTIME AS NEEDED.   [DISCONTINUED] Omega-3 Fatty Acids (FISH OIL PO) Take 1 tablet by mouth daily.   [DISCONTINUED] XOLAIR  150 MG/ML prefilled syringe INJECT 2 SYRINGES SUBCUTANEOUSLY EVERY 2 WEEKS   Facility-Administered  Encounter Medications as of 04/04/2024  Medication   omalizumab  (XOLAIR ) prefilled syringe 300 mg   omalizumab  (XOLAIR ) prefilled syringe 300 mg    Allergies (verified) Flagyl [metronidazole]   History: Past Medical History:  Diagnosis Date   Abnormal vaginal Pap smear    DISTANT HX- S/P LEEP   Allergic rhinitis    Allergy young child   Hay Fever   Anemia    pregnancy   Anxiety Nov. 2023   Hives have caused it to worsen   Arthritis 2006   Left ankle- chronic pain   Asthma    childhood, early 20's   Constipation    Depression    Dysrhythmia    occassional PVC's   Gallstone    GERD (gastroesophageal reflux disease)    Hx of cystitis    RECURRENT, DR. TANNENBAUM   Hyperlipidemia Sept. 2023   No need to treat   Insomnia    Osteoporosis 10/2008   DR. GREWAL, DEXA 10/2008   Postmenopausal    PVC's (premature ventricular contractions)    HX OF   Slow heart rate    Thyroid  disease Nov. 2023   Elevated Thyroid  antibodies   Tinnitus    Trapezius muscle spasm    Vitamin D  deficiency    Past Surgical History:  Procedure Laterality Date   ANKLE SURGERY Right 05/2011   repair of tendon   ANTERIOR AND POSTERIOR REPAIR N/A 05/24/2015   Procedure: ANTERIOR (CYSTOCELE) AND POSTERIOR REPAIR (RECTOCELE);  Surgeon: Rosaline Cobble, MD;  Location: [facility];  Service: Gynecology;  Laterality: N/A;   BLEPHAROPLASTY  12/2021   BREAST CYST ASPIRATION     2005   COSMETIC SURGERY  June 2023   Blepharoplasty   DIAGNOSTIC LAPAROSCOPY  07/14/2010   removed tubes and ovaries   GANGLION CYST EXCISION Right 2005   right wrist   SEPTOPLASTY  2009   VAGINAL HYSTERECTOMY  05/24/2015   Procedure: HYSTERECTOMY VAGINAL;  Surgeon: Rosaline Cobble, MD;  Location: [facility];  Service: Gynecology;;  uterus and cervix only   Family History  Problem Relation Age of Onset   Cancer Mother 17       ovarian   Hyperlipidemia Mother    Anxiety disorder Mother    Asthma Mother    COPD Mother     Depression Mother    Hearing loss Mother    Miscarriages / [place] Mother    Cancer Father 80       prostate   Alcohol abuse Father    Arthritis Father    Vision loss Father    Varicose Veins Father    Arthritis Sister    Hyperlipidemia Sister    Hypertension Sister    Graves' disease Sister    Depression Sister    Hypertension Sister    Cancer Brother 96       prostate   Depression Brother    Asthma Child  Asthma Child    Social History   Socioeconomic History   Marital status: Married    Spouse name: Not on file   Number of children: 3   Years of education: Not on file   Highest education level: Bachelor's degree (e.g., BA, AB, BS)  Occupational History   Occupation: NURSE   Occupation: Charity fundraiser    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 at age [age].  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   Drug use: No   Sexual activity: Not Currently    Birth control/protection: Surgical  Other Topics Concern   Not on file  Social History Narrative   2 grands   Social Drivers of Health   Financial Resource Strain: Low Risk  (04/01/2024)   Overall Financial Resource Strain (CARDIA)    Difficulty of Paying Living Expenses: Not hard at all  Food Insecurity: No Food Insecurity (04/01/2024)   Hunger Vital Sign    Worried About Running Out of Food in the Last Year: Never true    Ran Out of Food in the Last Year: Never true  Transportation Needs: No Transportation Needs (04/01/2024)   PRAPARE - Administrator, Civil Service (Medical): No    Lack of Transportation (Non-Medical): No  Physical  Activity: Sufficiently Active (04/01/2024)   Exercise Vital Sign    Days of Exercise per Week: 5 days    Minutes of Exercise per Session: 30 min  Stress: No Stress Concern Present (04/01/2024)   [employer] of Occupational Health - Occupational Stress Questionnaire    Feeling of Stress: Only a little  Social Connections: Moderately Integrated (04/01/2024)   Social Connection and Isolation Panel    Frequency of Communication with Friends and Family: More than three times a week    Frequency of Social Gatherings with Friends and Family: Twice a week    Attends Religious Services: 1 to 4 times per year    Active Member of [employer] or Organizations: No    Attends Engineer, structural: Not on file    Marital Status: Married    Tobacco Counseling Counseling given: Not Answered Tobacco comments: **smoked 1/4 ppd (or less) for about 1 yr. at age 27-17.    Clinical Intake:  Pre-visit preparation completed: Yes  Pain : No/denies pain     BMI - recorded: 20.3 Nutritional Status: BMI of 19-24  Normal Nutritional Risks: None Diabetes: No  Lab Results  Component Value Date   HGBA1C 5.0 05/04/2023     How often do you need to have someone help you when you read instructions, pamphlets, or other written materials from your doctor or pharmacy?: 1 - Never  Interpreter Needed?: No  Information entered by :: Ellouise Haws, LPN   Activities of Daily Living     04/01/2024    5:48 PM  In your present state of health, do you have any difficulty performing the following activities:  Hearing? 0  Vision? 0  Difficulty concentrating or making decisions? 0  Walking or climbing stairs? 0  Dressing or bathing? 0  Doing errands, shopping? 0  Preparing Food and eating ? N  Using the Toilet? N  In the past six months, have you accidently leaked urine? Y  Comment panty liner /pad  Do you have problems with loss of bowel control? N  Managing your Medications? N  Managing your  Finances? N  Housekeeping or managing your Housekeeping? N    Patient Care Team: Wendolyn Jenkins Jansky, MD as PCP - General (Family Medicine) Delford Maude BROCKS, MD as PCP - Cardiology (Cardiology)  I have updated your Care Teams any recent Medical Services you may have received from other providers in the past year.     Assessment:   This is a routine wellness examination for Chelsea Vega.  Hearing/Vision screen Hearing Screening - Comments:: Pt denies any hearing issues  Vision Screening - Comments:: Wears rx glasses - up to date with routine eye exams with [facility] [address] [place]     Goals Addressed             This Visit's Progress    Patient Stated       Maintain health and activity        Depression Screen     04/04/2024    3:41 PM 05/04/2023    8:29 AM 02/03/2023    9:43 AM 10/30/2022    9:51 AM 04/23/2016   11:49 AM  PHQ 2/9 Scores  PHQ - 2 Score 0 2 2 2  0  PHQ- 9 Score  7 7 6      Fall Risk     04/01/2024    5:48 PM 05/04/2023    8:29 AM 02/03/2023    9:42 AM 10/30/2022    9:51 AM 04/23/2016   11:49 AM  Fall Risk   Falls in the past year? 1 0 0 0 No   Number falls in past yr: 0 0 0 0   Injury with Fall? 1 0 0 0   Comment abrasion right  knee      Risk for fall due to : History of fall(s) No Fall Risks No Fall Risks No Fall Risks   Risk for fall due to: Comment tripped over disshwasher door      Follow up Falls prevention discussed Falls evaluation completed Falls evaluation completed Falls evaluation completed      Data saved with a previous flowsheet row definition    MEDICARE RISK AT HOME:  Medicare Risk at Home Any stairs in or around the home?: (Patient-Rptd) Yes If so, are there any without handrails?: (Patient-Rptd) Yes Home free of loose throw rugs in walkways, pet beds, electrical cords, etc?: (Patient-Rptd) Yes Adequate lighting in your home to reduce risk of falls?: (Patient-Rptd) Yes Life alert?: (Patient-Rptd) No Use of a cane, walker or  w/c?: (Patient-Rptd) No Grab bars in the bathroom?: (Patient-Rptd) No Shower chair or bench in shower?: (Patient-Rptd) No Elevated toilet seat or a handicapped toilet?: (Patient-Rptd) Yes  TIMED UP AND GO:  Was the test performed?  No  Cognitive Function: 6CIT completed        04/04/2024    3:43 PM  6CIT Screen  What Year? 0 points  What month? 0 points  What time? 0 points  Count back from 20 0 points  Months in reverse 0 points  Repeat phrase 0 points  Total Score 0 points    Immunizations Immunization History  Administered Date(s) Administered   Fluzone Influenza virus vaccine,trivalent (IIV3), split virus 06/20/2009, 04/21/2023  Hep A, Unspecified 07/25/2003, 02/09/2004   Influenza Inj Mdck Quad Pf 04/21/2019   Influenza Split 04/15/2010   Influenza, Seasonal, Injecte, Preservative Fre 04/17/2011   Influenza,inj,Quad PF,6+ Mos 04/16/2012, 03/28/2013, 05/05/2014, 04/10/2016   Influenza,inj,quad, With Preservative 04/08/2022   Influenza-Unspecified 05/15/2004, 04/15/2010, 04/21/2019   Moderna Sars-Covid-2 Vaccination 09/09/2019, 10/07/2019, 06/03/2020   Novel Infuenza-h1n1-09 05/20/2008   PNEUMOCOCCAL CONJUGATE-20 05/04/2023   Pfizer Covid-19 Vaccine Bivalent Booster 60yrs & up 04/02/2021   Td (Adult) 07/25/2003   Tdap 10/27/2014, 06/11/2021   Typhoid Live 07/25/2003   Zoster Recombinant(Shingrix) 08/02/2020, 01/22/2021   Zoster, Live 07/16/2020, 08/02/2020, 03/05/2021   Zoster, Unspecified 08/02/2020, 01/22/2021    Screening Tests Health Maintenance  Topic Date Due   Influenza Vaccine  02/12/2024   COVID-19 Vaccine (5 - 2025-26 season) 03/14/2024   Mammogram  09/28/2024   Medicare Annual Wellness (AWV)  04/04/2025   Colonoscopy  02/27/2028   DTaP/Tdap/Td (3 - Td or Tdap) 06/12/2031   Pneumococcal Vaccine: 50+ Years  Completed   DEXA SCAN  Completed   Hepatitis C Screening  Completed   Zoster Vaccines- Shingrix  Completed   HPV VACCINES  Aged Out    Meningococcal B Vaccine  Aged Out    Health Maintenance Items Addressed: See Nurse Notes at the end of this note  Additional Screening:  Vision Screening: Recommended annual ophthalmology exams for early detection of glaucoma and other disorders of the eye. Is the patient up to date with their annual eye exam?  Yes  Who is the provider or what is the name of the office in which the patient attends annual eye exams? [facility]   Dental Screening: Recommended annual dental exams for proper oral hygiene  Community Resource Referral / Chronic Care Management: CRR required this visit?  No   CCM required this visit?  No   Plan:    I have personally reviewed and noted the following in the patient's chart:   Medical and social history Use of alcohol, tobacco or illicit drugs  Current medications and supplements including opioid prescriptions. Patient is not currently taking opioid prescriptions. Functional ability and status Nutritional status Physical activity Advanced directives List of other physicians Hospitalizations, surgeries, and ER visits in previous 12 months Vitals Screenings to include cognitive, depression, and falls Referrals and appointments  In addition, I have reviewed and discussed with patient certain preventive protocols, quality metrics, and best practice recommendations. A written personalized care plan for preventive services as well as general preventive health recommendations were provided to patient.   Ellouise VEAR Haws, LPN   [id number]   After Visit Summary: (MyChart) Due to this being a telephonic visit, the after visit summary with patients personalized plan was offered to patient via MyChart   Notes: Nothing significant to report at this time.

## 2024-04-04 NOTE — Patient Instructions (Signed)
 Ms. Chelsea Vega,  Thank you for taking the time for your Medicare Wellness Visit. I appreciate your continued commitment to your health goals. Please review the care plan we discussed, and feel free to reach out if I can assist you further.  Medicare recommends these wellness visits once per year to help you and your care team stay ahead of potential health issues. These visits are designed to focus on prevention, allowing your provider to concentrate on managing your acute and chronic conditions during your regular appointments.  Please note that Annual Wellness Visits do not include a physical exam. Some assessments may be limited, especially if the visit was conducted virtually. If needed, we may recommend a separate in-person follow-up with your provider.  Ongoing Care Seeing your primary care provider every 3 to 6 months helps us  monitor your health and provide consistent, personalized care.   Referrals If a referral was made during today's visit and you haven't received any updates within two weeks, please contact the referred provider directly to check on the status.  Recommended Screenings:  Health Maintenance  Topic Date Due   Flu Shot  02/12/2024   COVID-19 Vaccine (5 - 2025-26 season) 03/14/2024   Medicare Annual Wellness Visit  05/03/2024   Breast Cancer Screening  09/28/2024   Colon Cancer Screening  02/27/2028   DTaP/Tdap/Td vaccine (3 - Td or Tdap) 06/12/2031   Pneumococcal Vaccine for age over 81  Completed   DEXA scan (bone density measurement)  Completed   Hepatitis C Screening  Completed   Zoster (Shingles) Vaccine  Completed   HPV Vaccine  Aged Out   Meningitis B Vaccine  Aged Out       04/04/2024    3:41 PM  Advanced Directives  Does Patient Have a Medical Advance Directive? Yes  Type of Estate agent of Chelsea Vega;Living will  Copy of Healthcare Power of Attorney in Chart? No - copy requested   Advance Care Planning is important because  it: Ensures you receive medical care that aligns with your values, goals, and preferences. Provides guidance to your family and loved ones, reducing the emotional burden of decision-making during critical moments.  Vision: Annual vision screenings are recommended for early detection of glaucoma, cataracts, and diabetic retinopathy. These exams can also reveal signs of chronic conditions such as diabetes and high blood pressure.  Dental: Annual dental screenings help detect early signs of oral cancer, gum disease, and other conditions linked to overall health, including heart disease and diabetes.  Please see the attached documents for additional preventive care recommendations.

## 2024-04-05 ENCOUNTER — Encounter: Payer: Self-pay | Admitting: Dietician

## 2024-04-05 ENCOUNTER — Encounter: Attending: Family Medicine | Admitting: Dietician

## 2024-04-05 VITALS — Ht 65.0 in | Wt 124.0 lb

## 2024-04-05 DIAGNOSIS — E063 Autoimmune thyroiditis: Secondary | ICD-10-CM | POA: Insufficient documentation

## 2024-04-05 NOTE — Progress Notes (Signed)
 Medical Nutrition Therapy Employee Wellness Visit  Appointment Start time:  [phone]  Appointment End time: [phone]  Referral diagnosis: Hashimoto's Thyroiditis (E06.3) Preferred learning style: no preference indicated (auditory, visual, hands on, no preference indicated) Learning readiness: ready (not ready, contemplating, ready, change in progress)  NUTRITION ASSESSMENT  Anthropometrics  Weight: 118.5 lb Height: 65 in  Body Composition Scale 06/25/2023 07/13/2023 12/29/2023 04/05/2024  Current Body Weight 117.4 118.5 122.8 124.0  Total Body Fat % 22.6 23.0 25.1 25.5  Visceral Fat 3 4 4 4   Fat-Free Mass % 77.3 76.9 74.8 74.4   Total Body Water % 53.1 52.9 51.9 51.7  Muscle-Mass lbs 27.8 27.9 27.7 27.7  BMI 19.2 19.3 20.1 20.3  Body Fat Displacement             Torso  lbs 16.2 16.7 18.9 19.4         Left Leg  lbs 3.2 3.3 3.7 3.8         Right Leg  lbs 3.2 3.3 3.7 3.8         Left Arm  lbs 1.6 1.6 1.8 1.9         Right Arm  lbs 1.6 1.6 1.8 1.9   Clinical Medical Hx: GERD, Hashimoto, Hives Medications: zolair (once a month) injection every two weeks, 300 mg), Pepcid twice a day, hormone therapy, Claritin , linzess  Labs: LDL 112 Notable Signs/Symptoms: none noted  Lifestyle & Dietary Hx  Pt states she is not trying to get more fermented foods anymore. Pt states she tried to eat gluten, stating it has been aggravating avoiding the gluten. Pt states she seems to be tolerating it, stating she is not sure. Pt states she has not had any hives flare ups, stating she did have some episodes that might have started and gone away, but not a full flare up. Pt states the inside of her ears have itched sometimes, stating some days are worse than other. Pt states she takes an antihistamine per day (Claritin ). Pt states she has picked up a class once or twice a week, when in town, stating it is a silver sneaker class with bands and light weights. Pt states she has followed a Low FODMAP eating pattern  in the past.  Estimated daily fluid intake: 48 oz Supplements: bone builder chewable, magnesium citrate, Vit D + K, SPM Active, beef gelatin powder,There-Biotic, GI Enzyme blend with betaine HCL, B12 shots once a month Sleep: sleeps okay, has Ambien  if needed, stating she might take it once every two weeks. Stress / self-care: 5-6 on a scale of 1-10; walking Current average weekly physical activity: walk 2-3 miles a day, daily; dumbbells some; silver sneaker classes (2 a week when in town)  24-Hr Dietary Recall First Meal: coconut yogurt, fruit (berries), granola or protein smoothie with fruit Snack:  Second Meal: oatmeal protein bar or carrots or crackers (nut thin or rice crackers) with cheese or mixed nuts or soup or left overs Snack:  Third Meal: protein, vegetable, rice Snack: grain free tortilla chips or pumpkin seeds or pecans Beverages: water, coffee, seltzer water with cranberry juice, wine (1-2 glasses 4 days a week)  NUTRITION DIAGNOSIS  NB-1.1 Food and nutrition-related knowledge deficit As related to calorie and protein intake.  As evidenced by food recall and recent weight loss.  NUTRITION INTERVENTION  Nutrition education (E-1) on the following topics:   Low-FODMAP nutrition therapy is a structured dietary approach used to manage symptoms of irritable bowel syndrome (IBS) and other  functional gut disorders. It involves temporarily eliminating foods high in fermentable carbohydrates--known as FODMAPs--which can cause bloating, gas, abdominal pain, and altered bowel habits. The process includes three phases: elimination, reintroduction, and personalization. During reintroduction, foods are systematically tested to identify individual triggers. The final phase tailors the diet to maintain symptom control while maximizing dietary variety and nutritional adequacy.  Handouts Provided Include  Body Comp Scale results Low-FODMAP Nutrition Therapy handout from [facility]  Manual  Learning Style & Readiness for Change Teaching method utilized: Visual & Auditory  Demonstrated degree of understanding via: Teach Back  Barriers to learning/adherence to lifestyle change: nothing identified  Goals Established by Pt Continue: add resistance training to physical activity: aim 2-3 days per week Continue: track protein; aim for 60 grams above the collagen supplements Continue: aim for 64 ounces of hydrating fluids Continue: focus on what you can eat and not what you can't, due to allergies and avoid weight loss. New: consider low FODMAP foods; eliminate, reintroduce and personalize  MONITORING & EVALUATION Dietary intake, weekly physical activity.  Next Steps  Patient is to return in 4 months for follow-up.

## 2024-04-18 ENCOUNTER — Encounter: Payer: Self-pay | Admitting: Internal Medicine

## 2024-04-20 ENCOUNTER — Encounter: Payer: Self-pay | Admitting: Physician Assistant

## 2024-04-20 ENCOUNTER — Ambulatory Visit (INDEPENDENT_AMBULATORY_CARE_PROVIDER_SITE_OTHER): Admitting: Physician Assistant

## 2024-04-20 VITALS — BP 106/70 | HR 57 | Temp 97.5°F | Ht 65.0 in | Wt 123.2 lb

## 2024-04-20 DIAGNOSIS — J309 Allergic rhinitis, unspecified: Secondary | ICD-10-CM

## 2024-04-20 DIAGNOSIS — L309 Dermatitis, unspecified: Secondary | ICD-10-CM

## 2024-04-20 MED ORDER — ACETIC ACID 2 % OT SOLN: 4.0000 [drp] | Freq: Three times a day (TID) | OTIC | 0 refills | Status: AC | PRN

## 2024-04-20 MED ORDER — DESONIDE 0.05 % EX CREA
TOPICAL_CREAM | Freq: Two times a day (BID) | CUTANEOUS | 0 refills | Status: AC
Start: 2024-04-20 — End: ?

## 2024-04-20 MED ORDER — AMOXICILLIN-POT CLAVULANATE 875-125 MG PO TABS: 1.0000 | ORAL_TABLET | Freq: Two times a day (BID) | ORAL | 0 refills | Status: DC

## 2024-04-20 MED ORDER — AZELASTINE HCL 0.1 % NA SOLN: 1.0000 | Freq: Two times a day (BID) | NASAL | 0 refills | Status: AC

## 2024-04-20 NOTE — Progress Notes (Signed)
 Chelsea Vega is a 68 y.o. female here for a follow up of a pre-existing problem.  History of Present Illness:   Chief Complaint  Patient presents with   Ear Problem    Pt c/o itchy ears x 3 months, itching has worsened past week.   Eye Problem    Pt c/o left eye burning and itch sometimes and pain left eye, slight headache left side, possible sinus issue.    Discussed the use of AI scribe software for clinical note transcription with the patient, who gave verbal consent to proceed.  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  She has been tapering Xolair  from every two weeks to once a month, which coincided with the onset of ear itching about a month ago. The itching has progressively worsened, and in the last four to five days, her ears have started draining with a sensation of 'weeping, itching'. She experiences a runny nose, attributed to seasonal allergies, and uses Flonase nasal spray daily. There is no significant nasal discharge, only clear drainage. She occasionally has a dry cough in the mornings or at night. She denies any significant cough or fever and has not had an ear infection in years.  She has developed symptoms in her left eye, including burning, light sensitivity, and difficulty wearing contacts, along with pain on the left side of her head. She is concerned about the possibility of Sjogren's syndrome due to her autoimmune background.  Her family history includes a sister with Graves' disease and a niece with Raynaud's and possibly mixed connective tissue disease. She is preparing for a trip to [place] and is concerned about her symptoms worsening or requiring urgent care while traveling.    Past Medical History:  Diagnosis Date   Abnormal vaginal Pap smear    DISTANT HX- S/P LEEP   Allergic rhinitis    Allergy young child   Hay  Fever   Anemia    pregnancy   Anxiety Nov. 2023   Hives have caused it to worsen   Arthritis 2006   Left ankle- chronic pain   Asthma    childhood, early 20's   Constipation    Depression    Dysrhythmia    occassional PVC's   Gallstone    GERD (gastroesophageal reflux disease)    Hx of cystitis    RECURRENT, DR. TANNENBAUM   Hyperlipidemia Sept. 2023   No need to treat   Insomnia    Osteoporosis 10/2008   DR. GREWAL, DEXA 10/2008   Postmenopausal    PVC's (premature ventricular contractions)    HX OF   Slow heart rate    Thyroid  disease Nov. 2023   Elevated Thyroid  antibodies   Tinnitus    Trapezius muscle spasm    Vitamin D  deficiency      Social History   Tobacco Use   Smoking status: Never   Smokeless tobacco: Never   Tobacco comments:    **smoked 1/4 ppd (or less) for about 1 yr. at age [age].  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   Drug use: No  Past Surgical History:  Procedure Laterality Date   ANKLE SURGERY Right 05/2011   repair of tendon   ANTERIOR AND POSTERIOR REPAIR N/A 05/24/2015   Procedure: ANTERIOR (CYSTOCELE) AND POSTERIOR REPAIR (RECTOCELE);  Surgeon: Rosaline Cobble, MD;  Location: [facility];  Service: Gynecology;  Laterality: N/A;   BLEPHAROPLASTY  12/2021   BREAST CYST ASPIRATION     2005   COSMETIC SURGERY  June 2023   Blepharoplasty   DIAGNOSTIC LAPAROSCOPY  07/14/2010   removed tubes and ovaries   GANGLION CYST EXCISION Right 2005   right wrist   SEPTOPLASTY  2009   VAGINAL HYSTERECTOMY  05/24/2015   Procedure: HYSTERECTOMY VAGINAL;  Surgeon: Rosaline Cobble, MD;  Location: [facility];  Service: Gynecology;;  uterus and cervix only    Family History  Problem Relation Age of Onset   Cancer Mother 33       ovarian    Hyperlipidemia Mother    Anxiety disorder Mother    Asthma Mother    COPD Mother    Depression Mother    Hearing loss Mother    Miscarriages / [place] Mother    Cancer Father 86       prostate   Alcohol abuse Father    Arthritis Father    Vision loss Father    Varicose Veins Father    Arthritis Sister    Hyperlipidemia Sister    Hypertension Sister    Graves' disease Sister    Depression Sister    Hypertension Sister    Cancer Brother 56       prostate   Depression Brother    Asthma Child    Asthma Child     Allergies  Allergen Reactions   Flagyl [Metronidazole] Other (See Comments)    Dizzy    Current Medications:   Current Outpatient Medications:    Ascorbic Acid (VITAMIN C) 500 MG CHEW, Chew 500 mg by mouth daily in the afternoon., Disp: , Rfl:    Calcium Carbonate (CALTRATE 600 PO), Take 1 tablet by mouth daily.  Chewable tablet, Disp: , Rfl:    cyanocobalamin  (VITAMIN B12) 1000 MCG/ML injection, , Disp: , Rfl:    EPINEPHrine  0.3 mg/0.3 mL IJ SOAJ injection, Inject 0.3 mg into the muscle as needed for anaphylaxis., Disp: 1 each, Rfl: 1   escitalopram  (LEXAPRO ) 10 MG tablet, TAKE 1 TABLET BY MOUTH EVERY DAY, Disp: 90 tablet, Rfl: 0   Estradiol  0.52 MG/0.87 GM (0.06%) GEL, Place 1 application onto the skin daily. , Disp: , Rfl:    famotidine (PEPCID) 20 MG tablet, 20 mg 2 (two) times daily., Disp: , Rfl:    fexofenadine (ALLEGRA ALLERGY) 180 MG tablet, Take 180 mg by mouth daily., Disp: , Rfl:    fluticasone (FLONASE) 50 MCG/ACT nasal spray, Place 1 spray into both nostrils daily. , Disp: , Rfl:    ibuprofen  (ADVIL ,MOTRIN ) 600 MG tablet, Take 1 tablet (600 mg total) by mouth every 6 (six) hours as needed (mild pain)., Disp: 60 tablet, Rfl: 0   levothyroxine  (SYNTHROID ) 25 MCG tablet, Take 1 tablet (25 mcg total) by mouth daily., Disp: 90 tablet, Rfl: 3   LINZESS 72 MCG capsule, , Disp: , Rfl:    loratadine  (CLARITIN ) 10 MG tablet, , Disp: , Rfl:    Magnesium  Citrate 85 MG CHEW, , Disp: , Rfl:    triamcinolone cream (KENALOG) 0.5 %, Apply topically., Disp: , Rfl:    zolpidem  (AMBIEN ) 5 MG tablet, TAKE 0.5 TABLETS (2.5 MG TOTAL)  BY MOUTH AT BEDTIME AS NEEDED., Disp: 30 tablet, Rfl: 0  Current Facility-Administered Medications:    omalizumab  (XOLAIR ) prefilled syringe 300 mg, 300 mg, Subcutaneous, Q28 days, Lorin Norris, MD, 300 mg at 02/25/23 1408   omalizumab  (XOLAIR ) prefilled syringe 300 mg, 300 mg, Subcutaneous, Q14 Days, Marinda Rocky SAILOR, MD, 300 mg at 03/25/23 1451   Review of Systems:   Negative unless otherwise specified per HPI.  Vitals:   Vitals:   04/20/24 1102  BP: 106/70  Pulse: (!) 57  Temp: (!) 97.5 F (36.4 C)  TempSrc: Temporal  SpO2: 99%  Weight: 123 lb 4 oz (55.9 kg)  Height: 5' 5 (1.651 m)     Body mass index is 20.51 kg/m.  Physical Exam:   Physical Exam Vitals and nursing note reviewed.  Constitutional:      General: She is not in acute distress.    Appearance: She is well-developed. She is not ill-appearing or toxic-appearing.  HENT:     Head: Normocephalic and atraumatic.     Right Ear: Ear canal and external ear normal. A middle ear effusion is present. Tympanic membrane is not erythematous, retracted or bulging.     Left Ear: Ear canal and external ear normal. A middle ear effusion is present. Tympanic membrane is not erythematous, retracted or bulging.     Ears:     Comments: Bilateral external ear canal with excoriations     Nose: Nose normal.     Right Sinus: No maxillary sinus tenderness or frontal sinus tenderness.     Left Sinus: No maxillary sinus tenderness or frontal sinus tenderness.     Mouth/Throat:     Pharynx: Uvula midline. No posterior oropharyngeal erythema.  Eyes:     General: Lids are normal.     Conjunctiva/sclera: Conjunctivae normal.  Neck:     Trachea: Trachea normal.  Cardiovascular:     Rate and Rhythm: Normal rate and regular rhythm.     Pulses: Normal pulses.      Heart sounds: Normal heart sounds, S1 normal and S2 normal.  Pulmonary:     Effort: Pulmonary effort is normal.     Breath sounds: Normal breath sounds. No decreased breath sounds, wheezing, rhonchi or rales.  Lymphadenopathy:     Cervical: No cervical adenopathy.  Skin:    General: Skin is warm and dry.  Neurological:     Mental Status: She is alert.     GCS: GCS eye subscore is 4. GCS verbal subscore is 5. GCS motor subscore is 6.  Psychiatric:        Speech: Speech normal.        Behavior: Behavior normal. Behavior is cooperative.     Assessment and Plan:   Assessment and Plan Assessment & Plan External ear dermatitis Superficial dermatitis with itching and drainage, likely from scratching. - Prescribed mild steroid cream for outer ear application with Q-tip. - Prescribed acetic acid drops to dry fluid, especially for swimming.  Allergic rhinitis Chronic allergic rhinitis with clear nasal drainage and itching, worsened by seasonal allergies. No sinus infection, but sinus pressure and eye discomfort present. - Prescribed azelastine nasal spray, 1-2 sprays per nostril up to twice daily as needed. - Continue Flonase, one spray per nostril daily. - Use saline nasal spray before Flonase. - Prescribed antibiotic for worsening sinus pressure or eye symptoms suggesting infection.    Lucie Buttner, PA-C

## 2024-04-20 NOTE — Patient Instructions (Signed)
  VISIT SUMMARY: During your visit, we addressed your worsening ear itching and drainage, as well as your concerns about allergic rhinitis and chronic urticaria. We provided treatments to help manage your symptoms and discussed your current medications.  YOUR PLAN: EXTERNAL EAR DERMATITIS: You have superficial dermatitis in your outer ear, causing itching and drainage. -Apply the prescribed mild steroid cream to your outer ear using a Q-tip. -Use the prescribed acetic acid drops to help dry the fluid, especially when swimming.  ALLERGIC RHINITIS: You have chronic allergic rhinitis with clear nasal drainage and itching, worsened by seasonal allergies. -Use azelastine nasal spray, 1-2 sprays per nostril, up to twice daily as needed. -Continue using Flonase, one spray per nostril daily. -Use saline nasal spray before applying Flonase. -Take the prescribed antibiotic if you experience worsening sinus pressure or eye symptoms that suggest an infection.  CHRONIC URTICARIA: You have chronic urticaria managed with antihistamines and Xolair . Your ear itching began after tapering Xolair . -Continue your current antihistamine regimen. -Discuss the Xolair  tapering with your allergist in December.                      Contains text generated by [employer].                                 Contains text generated by [employer].

## 2024-04-21 NOTE — Telephone Encounter (Signed)
 Let her know that she should try the medications that were recommended. If not responsive, can call and make a follow-up appointment.

## 2024-05-04 ENCOUNTER — Ambulatory Visit: Payer: Medicare Other | Admitting: Family Medicine

## 2024-05-04 ENCOUNTER — Ambulatory Visit: Payer: Self-pay | Admitting: Family Medicine

## 2024-05-04 ENCOUNTER — Encounter: Payer: Self-pay | Admitting: Family Medicine

## 2024-05-04 VITALS — BP 108/78 | HR 65 | Temp 97.7°F | Resp 18 | Ht 65.0 in | Wt 125.4 lb

## 2024-05-04 DIAGNOSIS — K219 Gastro-esophageal reflux disease without esophagitis: Secondary | ICD-10-CM | POA: Diagnosis not present

## 2024-05-04 DIAGNOSIS — Z79899 Other long term (current) drug therapy: Secondary | ICD-10-CM

## 2024-05-04 DIAGNOSIS — E538 Deficiency of other specified B group vitamins: Secondary | ICD-10-CM

## 2024-05-04 DIAGNOSIS — F5101 Primary insomnia: Secondary | ICD-10-CM

## 2024-05-04 DIAGNOSIS — F32A Depression, unspecified: Secondary | ICD-10-CM

## 2024-05-04 DIAGNOSIS — F419 Anxiety disorder, unspecified: Secondary | ICD-10-CM

## 2024-05-04 DIAGNOSIS — Z0000 Encounter for general adult medical examination without abnormal findings: Secondary | ICD-10-CM

## 2024-05-04 DIAGNOSIS — E063 Autoimmune thyroiditis: Secondary | ICD-10-CM | POA: Diagnosis not present

## 2024-05-04 DIAGNOSIS — E7800 Pure hypercholesterolemia, unspecified: Secondary | ICD-10-CM

## 2024-05-04 LAB — COMPREHENSIVE METABOLIC PANEL WITH GFR
ALT: 16 U/L (ref 0–35)
AST: 21 U/L (ref 0–37)
Albumin: 4.7 g/dL (ref 3.5–5.2)
Alkaline Phosphatase: 60 U/L (ref 39–117)
BUN: 16 mg/dL (ref 6–23)
CO2: 30 meq/L (ref 19–32)
Calcium: 9.1 mg/dL (ref 8.4–10.5)
Chloride: 101 meq/L (ref 96–112)
Creatinine, Ser: 0.83 mg/dL (ref 0.40–1.20)
GFR: 73.43 mL/min (ref >60.00)
Glucose, Bld: 78 mg/dL (ref 70–99)
Potassium: 3.9 meq/L (ref 3.5–5.1)
Sodium: 140 meq/L (ref 135–145)
Total Bilirubin: 0.7 mg/dL (ref 0.2–1.2)
Total Protein: 7 g/dL (ref 6.0–8.3)

## 2024-05-04 LAB — CBC WITH DIFFERENTIAL/PLATELET
Basophils Absolute: 0 K/uL (ref 0.0–0.1)
Basophils Relative: 0.9 % (ref 0.0–3.0)
Eosinophils Absolute: 0.2 K/uL (ref 0.0–0.7)
Eosinophils Relative: 3.9 % (ref 0.0–5.0)
HCT: 43.2 % (ref 36.0–46.0)
Hemoglobin: 14.3 g/dL (ref 12.0–15.0)
Lymphocytes Relative: 30.9 % (ref 12.0–46.0)
Lymphs Abs: 1.6 K/uL (ref 0.7–4.0)
MCHC: 33 g/dL (ref 30.0–36.0)
MCV: 95.9 fl (ref 78.0–100.0)
Monocytes Absolute: 0.4 K/uL (ref 0.1–1.0)
Monocytes Relative: 7.8 % (ref 3.0–12.0)
Neutro Abs: 2.9 K/uL (ref 1.4–7.7)
Neutrophils Relative %: 56.5 % (ref 43.0–77.0)
Platelets: 223 K/uL (ref 150.0–400.0)
RBC: 4.51 Mil/uL (ref 3.87–5.11)
RDW: 12.1 % (ref 11.5–15.5)
WBC: 5.2 K/uL (ref 4.0–10.5)

## 2024-05-04 LAB — LIPID PANEL
Cholesterol: 230 mg/dL — ABNORMAL HIGH (ref 0–200)
HDL: 75.3 mg/dL (ref >39.00)
LDL Cholesterol: 135 mg/dL — ABNORMAL HIGH (ref 0–99)
NonHDL: 154.68
Total CHOL/HDL Ratio: 3
Triglycerides: 100 mg/dL (ref 0.0–149.0)
VLDL: 20 mg/dL (ref 0.0–40.0)

## 2024-05-04 MED ORDER — CYANOCOBALAMIN 1000 MCG/ML IJ SOLN
1000.0000 ug | Freq: Once | INTRAMUSCULAR | Status: AC
  Administered 2024-05-04: 1000 ug via INTRAMUSCULAR

## 2024-05-04 MED ORDER — ESCITALOPRAM OXALATE 10 MG PO TABS: 10.0000 mg | ORAL_TABLET | Freq: Every day | ORAL | 3 refills | Status: AC

## 2024-05-04 MED ORDER — ZOLPIDEM TARTRATE 5 MG PO TABS: 2.5000 mg | ORAL_TABLET | Freq: Every evening | ORAL | 1 refills | Status: AC | PRN

## 2024-05-04 NOTE — Patient Instructions (Signed)

## 2024-05-04 NOTE — Progress Notes (Signed)
Labs ok except Your cholesterol levels are elevated.  Work on low cholesterol and lower carbs/sugars diet and  get exercise to try to lower your cholesterol.

## 2024-05-04 NOTE — Progress Notes (Signed)
 Phone [phone]   Subjective:   Patient is a 68 y.o. female presenting for annual physical.    Chief Complaint  Patient presents with   Annual Exam    Doing well , no complaints - fasting     Discussed the use of AI scribe software for clinical note transcription with the patient, who gave verbal consent to proceed.  History of Present Illness Chelsea Vega is a 68 year old female who presents for a Medicare physical. Exercises, good diet  She experiences dry eyes and was prescribed steroid eye drops by her ophthalmologist. She is completing the course of these drops today.  She has a history of anxiety managed with Lexapro , which she finds effective. No thoughts of suicide are present.  She experiences insomnia and takes Ambien  occasionally, approximately once or twice a week. She has about five or six pills left and is interested in a refill. She takes a quarter of a pill when she uses it.  She has been experiencing constipation and is on medication for it, but finds the response sporadic. After stopping the medication, constipation returned. Initially, she had regular bowel movements, but after changing the timing of the medication to nighttime, she experienced diarrhea, possibly due to increased fluid intake in the evening.  She mentions chronic chest pain, which she believes is related to GERD. The pain is described as shooting and random, sometimes radiating under her armpit. She has been evaluated by a cardiologist who ruled out cardiac issues. She takes famotidine daily for reflux.  She has a history of high cholesterol and is working on her diet and exercise. She also receives B12 shots monthly, which she has been doing for over a year.  B12 def-getting monthly injections    See problem oriented charting- ROS- ROS: Gen: no fever, chills  Skin: no rash, itching ENT: no ear pain, ear drainage, nasal congestion, rhinorrhea, sinus pressure, sore throat Eyes: no blurry  vision, double vision Resp: no cough, wheeze,SOB CV: no CP, palpitations, LE edema,  GI: HPI GU: no dysuria, urgency, frequency, hematuria MSK: no joint pain, myalgias, back pain Neuro: no dizziness, headache, weakness, vertigo Psych: HPI   The following were reviewed and entered/updated in epic: Past Medical History:  Diagnosis Date   Abnormal vaginal Pap smear    DISTANT HX- S/P LEEP   Allergic rhinitis    Allergy young child   Hay Fever   Anemia    pregnancy   Anxiety Nov. 2023   Hives have caused it to worsen   Arthritis 2006   Left ankle- chronic pain   Asthma    childhood, early 20's   Constipation    Depression    Dysrhythmia    occassional PVC's   Gallstone    GERD (gastroesophageal reflux disease)    Hx of cystitis    RECURRENT, DR. TANNENBAUM   Hyperlipidemia Sept. 2023   No need to treat   Insomnia    Osteoporosis 10/2008   DR. GREWAL, DEXA 10/2008   Postmenopausal    PVC's (premature ventricular contractions)    HX OF   Slow heart rate    Thyroid  disease Nov. 2023   Elevated Thyroid  antibodies   Tinnitus    Trapezius muscle spasm    Vitamin D  deficiency    Patient Active Problem List   Diagnosis Date Noted   Anxiety and depression 11/23/2023   Irritable bowel syndrome with constipation 07/31/2023   Chronic urticaria 07/31/2023   Gastroesophageal reflux disease  07/31/2023   Primary insomnia 02/03/2023   Idiopathic urticaria 10/30/2022   Vitamin B6 deficiency 10/30/2022   Vitamin B12 deficiency 10/30/2022   Calculus of gallbladder without cholecystitis without obstruction 10/30/2022   Hashimoto's thyroiditis 10/29/2022   Prolapse of female pelvic organs 05/24/2015   Ankle sprain 03/06/2011   Past Surgical History:  Procedure Laterality Date   ANKLE SURGERY Right 05/2011   repair of tendon   ANTERIOR AND POSTERIOR REPAIR N/A 05/24/2015   Procedure: ANTERIOR (CYSTOCELE) AND POSTERIOR REPAIR (RECTOCELE);  Surgeon: Rosaline Cobble, MD;   Location: [facility];  Service: Gynecology;  Laterality: N/A;   BLEPHAROPLASTY  12/2021   BREAST CYST ASPIRATION     2005   COSMETIC SURGERY  June 2023   Blepharoplasty   DIAGNOSTIC LAPAROSCOPY  07/14/2010   removed tubes and ovaries   GANGLION CYST EXCISION Right 2005   right wrist   SEPTOPLASTY  2009   VAGINAL HYSTERECTOMY  05/24/2015   Procedure: HYSTERECTOMY VAGINAL;  Surgeon: Rosaline Cobble, MD;  Location: [facility];  Service: Gynecology;;  uterus and cervix only    Family History  Problem Relation Age of Onset   Cancer Mother 38       ovarian   Hyperlipidemia Mother    Anxiety disorder Mother    Asthma Mother    COPD Mother    Depression Mother    Hearing loss Mother    Miscarriages / [place] Mother    Cancer Father 59       prostate   Alcohol abuse Father    Arthritis Father    Vision loss Father    Varicose Veins Father    Arthritis Sister    Hyperlipidemia Sister    Hypertension Sister    Graves' disease Sister    Depression Sister    Hypertension Sister    Cancer Brother 29       prostate   Depression Brother    Asthma Child    Asthma Child     Medications- reviewed and updated Current Outpatient Medications  Medication Sig Dispense Refill   acetic acid 2 % otic solution Place 4 drops into both ears 3 (three) times daily as needed (itchy/wet ears). 15 mL 0   Ascorbic Acid (VITAMIN C) 500 MG CHEW Chew 500 mg by mouth daily in the afternoon.     azelastine (ASTELIN) 0.1 % nasal spray Place 1 spray into both nostrils 2 (two) times daily. Use in each nostril as directed 30 mL 0   Calcium Carbonate (CALTRATE 600 PO) Take 1 tablet by mouth daily.  Chewable tablet     cyanocobalamin  (VITAMIN B12) 1000 MCG/ML injection      desonide (DESOWEN) 0.05 % cream Apply topically 2 (two) times daily. 30 g 0   EPINEPHrine  0.3 mg/0.3 mL IJ SOAJ injection Inject 0.3 mg into the muscle as needed for anaphylaxis. 1 each 1   escitalopram  (LEXAPRO ) 10 MG tablet Take 1 tablet  (10 mg total) by mouth daily. 90 tablet 3   Estradiol  0.52 MG/0.87 GM (0.06%) GEL Place 1 application onto the skin daily.      famotidine (PEPCID) 20 MG tablet 20 mg 2 (two) times daily.     fexofenadine (ALLEGRA ALLERGY) 180 MG tablet Take 180 mg by mouth daily.     fluticasone (FLONASE) 50 MCG/ACT nasal spray Place 1 spray into both nostrils daily.      ibuprofen  (ADVIL ,MOTRIN ) 600 MG tablet Take 1 tablet (600 mg total) by mouth every 6 (six) hours  as needed (mild pain). 60 tablet 0   levothyroxine  (SYNTHROID ) 25 MCG tablet Take 1 tablet (25 mcg total) by mouth daily. 90 tablet 3   LINZESS 72 MCG capsule      loratadine  (CLARITIN ) 10 MG tablet      Magnesium Citrate 85 MG CHEW      triamcinolone cream (KENALOG) 0.5 % Apply topically.     zolpidem  (AMBIEN ) 5 MG tablet Take 0.5 tablets (2.5 mg total) by mouth at bedtime as needed. 30 tablet 1   Current Facility-Administered Medications  Medication Dose Route Frequency Provider Last Rate Last Admin   omalizumab  (XOLAIR ) prefilled syringe 300 mg  300 mg Subcutaneous Q28 days Lorin Norris, MD   300 mg at 02/25/23 1408   omalizumab  (XOLAIR ) prefilled syringe 300 mg  300 mg Subcutaneous Q14 Days Marinda Rocky SAILOR, MD   300 mg at 03/25/23 1451    Allergies-reviewed and updated Allergies  Allergen Reactions   Flagyl [Metronidazole] Other (See Comments)    Dizzy    Social History   Social History Narrative   2 grands   Objective  Objective:  BP 108/78   Pulse 65   Temp 97.7 F (36.5 C)   Resp 18   Ht 5' 5 (1.651 m)   Wt 125 lb 6.4 oz (56.9 kg)   SpO2 98%   BMI 20.87 kg/m  Physical Exam  Gen: WDWN NAD HEENT: NCAT, conjunctiva not injected, sclera nonicteric TM WNL B, OP moist, no exudates  NECK:  supple, no thyromegaly, no nodes, no carotid bruits CARDIAC: RRR, S1S2+, no murmur. DP 2+B LUNGS: CTAB. No wheezes ABDOMEN:  BS+, soft, NTND, No HSM, no masses EXT:  no edema MSK: no gross abnormalities. MS 5/5 all 4 NEURO: A&O  x3.  CN II-XII intact.  PSYCH: normal mood. Good eye contact     Assessment and Plan   Health Maintenance counseling: 1. Anticipatory guidance: Patient counseled regarding regular dental exams q6 months, eye exams,  avoiding smoking and second hand smoke, limiting alcohol to 1 beverage per day, no illicit drugs.   2. Risk factor reduction:  Advised patient of need for regular exercise and diet rich and fruits and vegetables to reduce risk of heart attack and stroke. Exercise- +.  Wt Readings from Last 3 Encounters:  05/04/24 125 lb 6.4 oz (56.9 kg)  04/20/24 123 lb 4 oz (55.9 kg)  04/05/24 124 lb (56.2 kg)   3. Immunizations/screenings/ancillary studies Immunization History  Administered Date(s) Administered   Fluzone Influenza virus vaccine,trivalent (IIV3), split virus 06/20/2009, 04/21/2023   Hep A, Unspecified 07/25/2003, 02/09/2004   Influenza Inj Mdck Quad Pf 04/21/2019   Influenza Split 04/15/2010   Influenza, Seasonal, Injecte, Preservative Fre 04/17/2011   Influenza,inj,Quad PF,6+ Mos 04/16/2012, 03/28/2013, 05/05/2014, 04/10/2016   Influenza,inj,quad, With Preservative 04/08/2022   Influenza-Unspecified 05/15/2004, 04/15/2010, 04/21/2019, 04/22/2024   Moderna Sars-Covid-2 Vaccination 09/09/2019, 10/07/2019, 06/03/2020   Novel Infuenza-h1n1-09 05/20/2008   PNEUMOCOCCAL CONJUGATE-20 05/04/2023   Pfizer Covid-19 Vaccine Bivalent Booster 64yrs & up 04/02/2021   Td (Adult) 07/25/2003   Tdap 10/27/2014, 06/11/2021   Typhoid Live 07/25/2003   Zoster Recombinant(Shingrix) 08/02/2020, 01/22/2021   Zoster, Live 07/16/2020, 08/02/2020, 03/05/2021   Zoster, Unspecified 08/02/2020, 01/22/2021   There are no preventive care reminders to display for this patient.  4. Cervical cancer screening- utd 5. Breast cancer screening-  mammogram utd 6. Colon cancer screening - 02/27/28 7. Skin cancer screening- advised regular sunscreen use. Denies worrisome, changing, or new skin lesions.  8. Birth control/STD check- n/a 9. Osteoporosis screening- utd 10. Smoking associated screening - non smoker  Wellness examination  Hashimoto's thyroiditis  Gastroesophageal reflux disease without esophagitis  Vitamin B12 deficiency -     CBC with Differential/Platelet -     Cyanocobalamin   Primary insomnia -     Zolpidem  Tartrate; Take 0.5 tablets (2.5 mg total) by mouth at bedtime as needed.  Dispense: 30 tablet; Refill: 1  Anxiety and depression -     Escitalopram  Oxalate; Take 1 tablet (10 mg total) by mouth daily.  Dispense: 90 tablet; Refill: 3  High risk medication use -     Comprehensive metabolic panel with GFR  Pure hypercholesterolemia -     Comprehensive metabolic panel with GFR -     Lipid panel    Assessment and Plan Assessment & Plan Adult Wellness Visit   She underwent a routine Medicare physical examination with no new surgeries or significant changes in her medical history. Regular exercise and dietary management are ongoing, and all vaccinations are up to date. Continue regular exercise and a healthy diet, maintain the current vaccination schedule, and schedule routine dental visits.  Gastroesophageal reflux disease (GERD)   She experiences intermittent chest pain possibly related to GERD, with musculoskeletal causes such as a pinched nerve considered. The pain is random, not associated with food intake, and not exercise-induced. Cardiac causes have been ruled out by a cardiologist. Continue famotidine for GERD and perform chest and shoulder stretches to alleviate potential musculoskeletal pain.  Constipation   She has chronic constipation with sporadic response to medication. A recent change in medication timing may have contributed to diarrhea due to increased fluid intake at night. Resume taking constipation medication in the morning.  Insomnia   Occasional insomnia is managed with Ambien , used approximately once a week or less, with no signs of  dependency or overuse. Refill Ambien  prescription. Pdmp checked  Anxiety disorder   Her anxiety is well-controlled with Lexapro , and no suicidal ideation is reported. Refill Lexapro  prescription.  Vitamin B12 deficiency   Long-term management with monthly B12 injections is ongoing. The last B12 level was checked in January. Administer a B12 injection today and schedule monthly B12 injections. Check cbc  Hyperlipidemia   Hyperlipidemia is managed with diet and exercise, with no recent changes in the management plan. Continue dietary management and regular exercise. High risk meds-check cmp  Hashimotos-managed by endo.       Recommended follow up: Return in about 1 year (around 05/04/2025) for 1 yr annual, annual physical.  Lab/Order associations:+ fasting  Jenkins CHRISTELLA Carrel, MD

## 2024-06-01 ENCOUNTER — Ambulatory Visit (INDEPENDENT_AMBULATORY_CARE_PROVIDER_SITE_OTHER)

## 2024-06-01 DIAGNOSIS — E538 Deficiency of other specified B group vitamins: Secondary | ICD-10-CM | POA: Diagnosis not present

## 2024-06-01 MED ORDER — CYANOCOBALAMIN 1000 MCG/ML IJ SOLN
1000.0000 ug | Freq: Once | INTRAMUSCULAR | Status: AC
  Administered 2024-06-01: 1000 ug via INTRAMUSCULAR

## 2024-06-01 NOTE — Progress Notes (Signed)
.  Patient is in office today for a nurse visit for B12 Injection per Dr. Wendolyn. Patient Injection was given in the  Left deltoid. Patient tolerated injection well. Advised pt to schedule next B12 injection. Pt understood

## 2024-06-22 ENCOUNTER — Ambulatory Visit: Admitting: Internal Medicine

## 2024-06-22 ENCOUNTER — Encounter: Payer: Self-pay | Admitting: Internal Medicine

## 2024-06-22 VITALS — BP 108/70 | HR 57 | Temp 96.8°F | Resp 18 | Wt 122.0 lb

## 2024-06-22 DIAGNOSIS — L508 Other urticaria: Secondary | ICD-10-CM

## 2024-06-22 DIAGNOSIS — E063 Autoimmune thyroiditis: Secondary | ICD-10-CM

## 2024-06-22 DIAGNOSIS — K581 Irritable bowel syndrome with constipation: Secondary | ICD-10-CM | POA: Diagnosis not present

## 2024-06-22 NOTE — Progress Notes (Signed)
 FOLLOW UP Date of Service/Encounter:   06/22/2024  Subjective:  Chelsea Vega (DOB: 1955-11-13) is a 68 y.o. female who [facility] in re-evaluation of the following: chronic urticaria History obtained from: chart review and patient.  For Review, LV was on 12/30/23  with Dr.Avenly Roberge seen for routine follow-up. See below for summary of history and diagnostics.   Therapeutic plans/changes recommended: still having breakthrough itching, continued on Xolair  300 q2weeks, recent diagnosis of SIBO ----------------------------------------------------- Pertinent History/Diagnostics:  in PM, ice packs. Pcitures of rash consistent with typical urticaria. - labs (09/11/22): negative CU index, negative alpha gal panel, normal CBCd, CMP, normal thyroid  TSH, T4, elevated TPO, normal ESR, baseline tryptase 5.6 Diagnosed with Hashimoto thyroiditis based on labs. On selenium and LT4. Xolair  started in May 2024 01/29/23-patient stopped topical estrogen and was started on levothyroxine  by her endocrinologist for Hashimoto. Xolair  increased to 300 mg every 2 weeks. --------------------------------------------------- Today presents for follow-up. Discussed the use of AI scribe software for clinical note transcription with the patient, who gave verbal consent to proceed.  History of Present Illness Chelsea Vega is a 68 year old female with chronic hives who presents for follow-up on her Xolair  treatment.  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 No new pertinent medical or surgical history except as noted in HPI.  ROS: All others negative except as noted per HPI.   Objective:  BP 108/70   Pulse (!) 57   Temp (!) 96.8 F (36 C) (Temporal)   Resp 18   Wt 122 lb (55.3 kg)   SpO2 98%   BMI 20.30 kg/m  Body mass index is 20.3 kg/m. Physical Exam: General Appearance:  Alert, cooperative, no distress, appears stated age  Head:  Normocephalic, without obvious abnormality, atraumatic  Eyes:  Conjunctiva clear, EOM's intact  Ears EACs normal bilaterally and normal TMs bilaterally  Nose: Nares normal, normal mucosa and no visible anterior polyps  Throat: Lips, tongue normal; teeth and gums normal, normal posterior oropharynx  Neck: Supple, symmetrical  Lungs:   clear to auscultation bilaterally, Respirations unlabored, no coughing  Heart:  regular rate and rhythm and no murmur, Appears well perfused  Extremities: No edema  Skin: Skin color, texture, turgor normal and no rashes or lesions on visualized portions of skin  Neurologic: No gross deficits   Labs:  Lab Orders  No laboratory test(s) ordered today   Assessment/Plan   Chronic Urticaria Doing well on Xolair  every 4 weeks, discussed spacing further.  -Space Xolair  to every t weeks, if doing well, go to 8 weeks, then 10 weeks then 12 weeks then off - if hives return, return to previously tolerated dose -Reevaluate in 6 months, ideally with patient off antihistamines to assess for potential reduction in Xolair .  Gastroesophageal Reflux Disease (GERD) Managed with  Pepcid daily. -Continue Pepcid daily. -continue follow-up with GI  Irritable Bowel Syndrome (IBS)  with constipation and SIBO Patient reports bloating and gas. Recently started on a FODMAP diet  Interval Hx: diagnosed and treated for SIBO with improvement in symptoms including hives. -Continue follow-up with GI.  Follow up : 6 months, sooner if needed. It was a pleasure seeing you again in clinic today! Thank you for allowing me to participate in your care.  Rocky Endow, MD [facility]  Other: none  Rocky Endow, MD  [facility] 

## 2024-06-22 NOTE — Patient Instructions (Signed)
 Chronic Urticaria Doing well on Xolair  every 4 weeks, discussed spacing further.  -Space Xolair  to every t weeks, if doing well, go to 8 weeks, then 10 weeks then 12 weeks then off - if hives return, return to previously tolerated dose -Reevaluate in 6 months, ideally with patient off antihistamines to assess for potential reduction in Xolair .  Gastroesophageal Reflux Disease (GERD) Managed with Pepcid daily. -Continue Pepcid daily. -continue follow-up with GI  Irritable Bowel Syndrome (IBS) with constipation and SIBO Patient reports bloating and gas. Recently started on a FODMAP diet  Interval Hx: diagnosed and treated for SIBO with improvement in symptoms including hives. -Continue follow-up with GI.  Follow up : 6 months, sooner if needed. It was a pleasure seeing you again in clinic today! Thank you for allowing me to participate in your care.  Rocky Endow, MD [facility]

## 2024-06-29 ENCOUNTER — Ambulatory Visit

## 2024-06-29 DIAGNOSIS — E538 Deficiency of other specified B group vitamins: Secondary | ICD-10-CM | POA: Diagnosis not present

## 2024-06-29 MED ORDER — CYANOCOBALAMIN 1000 MCG/ML IJ SOLN
1000.0000 ug | Freq: Once | INTRAMUSCULAR | Status: AC
  Administered 2024-06-29: 10:00:00 1000 ug via INTRAMUSCULAR

## 2024-06-29 NOTE — Progress Notes (Signed)
 Patient is in office today for a nurse visit for B12 Injection. Patient Injection was given in the  Left deltoid. Patient tolerated injection well. No other questions or concerns were addressed during this visit.

## 2024-07-20 ENCOUNTER — Other Ambulatory Visit: Payer: Self-pay | Admitting: Medical Genetics

## 2024-07-25 ENCOUNTER — Other Ambulatory Visit: Payer: Self-pay

## 2024-07-25 DIAGNOSIS — Z006 Encounter for examination for normal comparison and control in clinical research program: Secondary | ICD-10-CM

## 2024-07-25 LAB — GENECONNECT MOLECULAR SCREEN

## 2024-07-27 ENCOUNTER — Ambulatory Visit

## 2024-07-27 DIAGNOSIS — E538 Deficiency of other specified B group vitamins: Secondary | ICD-10-CM | POA: Diagnosis not present

## 2024-07-27 MED ORDER — CYANOCOBALAMIN 1000 MCG/ML IJ SOLN
1000.0000 ug | Freq: Once | INTRAMUSCULAR | Status: AC
  Administered 2024-07-27: 1000 ug via INTRAMUSCULAR

## 2024-07-27 NOTE — Progress Notes (Signed)
 Patient is in office today for a nurse visit for B12 Injection. Patient Injection was given in the  Right deltoid. Patient tolerated injection well.

## 2024-08-01 ENCOUNTER — Encounter: Payer: Self-pay | Admitting: Dietician

## 2024-08-01 ENCOUNTER — Encounter: Attending: Family Medicine | Admitting: Dietician

## 2024-08-01 DIAGNOSIS — E063 Autoimmune thyroiditis: Secondary | ICD-10-CM | POA: Insufficient documentation

## 2024-08-01 NOTE — Progress Notes (Signed)
 Medical Nutrition Therapy Employee Wellness Visit  Appointment Start time:  [phone]  Appointment End time: 1120  Referral diagnosis: Hashimoto's Thyroiditis (E06.3) Preferred learning style: no preference indicated (auditory, visual, hands on, no preference indicated) Learning readiness: ready (not ready, contemplating, ready, change in progress)  NUTRITION ASSESSMENT  Anthropometrics  Weight: 121.5 lb Height: 65 in  Body Composition Scale 06/25/2023 07/13/2023 12/29/2023 04/05/2024 08/01/2024  Current Body Weight 117.4 118.5 122.8 124.0 121.5  Total Body Fat % 22.6 23.0 25.1 25.5 24.6  Visceral Fat 3 4 4 4 4   Fat-Free Mass % 77.3 76.9 74.8 74.4 75.3   Total Body Water % 53.1 52.9 51.9 51.7 52.1  Muscle-Mass lbs 27.8 27.9 27.7 27.7 27.6  BMI 19.2 19.3 20.1 20.3 19.9  Body Fat Displacement              Torso  lbs 16.2 16.7 18.9 19.4 18.4         Left Leg  lbs 3.2 3.3 3.7 3.8 3.6         Right Leg  lbs 3.2 3.3 3.7 3.8 3.6         Left Arm  lbs 1.6 1.6 1.8 1.9 1.8         Right Arm  lbs 1.6 1.6 1.8 1.9 1.8   Clinical Medical Hx: GERD, Hashimoto, Hives, osteoporosis, osteopenia Medications: zolair (once a month) injection every two weeks, 300 mg), Pepcid twice a day, hormone therapy, Claritin , Linzess, NEW Omeprazole (delayed released) Labs: LDL 112 Notable Signs/Symptoms: none noted  Lifestyle & Dietary Hx  Pt states she had an episode of gastritis. Pt states she had an endoscopy, stating the doctor (GI doctor) took a few biopsies. Pt states she has not found out about that yet. Pt states she tried to stick with a FODMAP foods diet, stating it is difficult when her spouse/husband does not eat the same way. Pt states she may need to give up coffee to reduce caffeine. Pt states she started taking Omeprazole, stating the plan is to be on it temporarily. Pt states she is trying to come off the shots for her hives, stating gradually. Pt states her doctor recommended low acid foods. Pt  states she has always had GERD.  Estimated daily fluid intake: 48 oz Supplements: bone builder chewable, magnesium citrate, Vit D + K, SPM Active, beef gelatin powder,There-Biotic, GI Enzyme blend with betaine HCL, B12 shots once a month Sleep: sleeps okay, has Ambien  if needed, stating she might take it once every two weeks. Stress / self-care: 5-6 on a scale of 1-10; walking Current average weekly physical activity: walk 2-3 miles a day, 5 days per week; silver sneaker classes (M, T, W) using light weights on Monday and Wednesday and Tuesday class focuses on cardio.  24-Hr Dietary Recall First Meal: coconut yogurt, fruit (berries), granola or protein smoothie with fruit Snack:  Second Meal: oatmeal protein bar or carrots or crackers (nut thin or rice crackers) with cheese or mixed nuts or soup or left overs Snack:  Third Meal: protein, vegetable, rice Snack: grain free tortilla chips or pumpkin seeds or pecans Beverages: water, coffee, seltzer water with cranberry juice, wine (1-2 glasses 4 days a week)  NUTRITION DIAGNOSIS  NB-1.1 Food and nutrition-related knowledge deficit As related to calorie and protein intake.  As evidenced by food recall and recent weight loss.  NUTRITION INTERVENTION  Nutrition education (E-1) on the following topics:   Lowering caffeine can be helpful for managing gastroesophageal reflux disease because  caffeine may relax the lower esophageal sphincter--the muscle that keeps stomach acid from moving upward. When this muscle becomes looser, acid can more easily reflux into the esophagus, leading to heartburn and irritation. Reducing caffeine from sources like coffee, tea, energy drinks, and sodas can lessen these episodes and make symptoms more manageable. Many people find that switching to low-caffeine or caffeine-free alternatives helps reduce discomfort and supports better overall reflux control. Eating small, frequent meals can help with GERD because it reduces  the amount of food sitting in your stomach at one time, which lowers pressure on the lower esophageal sphincter--the muscle that keeps stomach acid from moving upward. Large meals stretch the stomach and increase the likelihood of acid reflux, while smaller portions are easier to digest and less likely to trigger symptoms. Eating more often in controlled amounts also helps keep your stomach from becoming overly full or overly empty, both of which can worsen discomfort. This steady, gentle approach to eating supports smoother digestion and can make reflux symptoms much more manageable. Avoiding skipped meals can help reduce reflux because long gaps without eating allow stomach acid to build up, which can increase irritation and make reflux more likely when you finally do eat. Going too long between meals can also lead to overeating later, and larger meals put more pressure on the lower esophageal sphincter--the muscle that keeps acid from moving upward. Keeping a steady pattern of smaller, balanced meals helps regulate stomach acid levels, supports smoother digestion, and reduces the chances of reflux flaring up.  Handouts Provided Include  Body Comp Scale results GERD handout from Nutrition Care Manual  Learning Style & Readiness for Change Teaching method utilized: Visual & Auditory  Demonstrated degree of understanding via: Teach Back  Barriers to learning/adherence to lifestyle change: nothing identified  Goals Established by Pt Continue: add resistance training to physical activity: aim 2-3 days per week Continue: track protein; aim for 60 grams above the collagen supplements Continue: aim for 64 ounces of hydrating fluids Continue: focus on what you can eat and not what you can't, due to allergies and avoid weight loss. New: reduce caffeine; avoid coffee New: eat small frequent meals; avoid skipping meals  MONITORING & EVALUATION Dietary intake, weekly physical activity.  Next Steps   Patient is to return in 3 months for follow-up.

## 2024-08-02 NOTE — Addendum Note (Signed)
 Addended by: DELPHINE BRUNO HERO on: 08/02/2024 12:39 PM   Modules accepted: Orders

## 2024-08-17 LAB — GENECONNECT MOLECULAR SCREEN: Genetic Analysis Overall Interpretation: NEGATIVE

## 2024-08-31 ENCOUNTER — Ambulatory Visit

## 2024-09-07 ENCOUNTER — Ambulatory Visit

## 2024-09-28 ENCOUNTER — Ambulatory Visit

## 2024-11-02 ENCOUNTER — Ambulatory Visit

## 2024-11-07 ENCOUNTER — Encounter: Admitting: Dietician

## 2024-12-21 ENCOUNTER — Ambulatory Visit

## 2024-12-26 ENCOUNTER — Ambulatory Visit: Admitting: Internal Medicine

## 2025-04-10 ENCOUNTER — Ambulatory Visit

## 2025-05-10 ENCOUNTER — Encounter: Admitting: Family Medicine

## 2025-05-26 ENCOUNTER — Encounter: Admitting: Family Medicine
